# Patient Record
Sex: Female | Born: 1990 | Race: Black or African American | Hispanic: No | Marital: Single | Smoking: Current every day smoker
Health system: Southern US, Community
[De-identification: ages and names within clinical notes are randomized; demographics above are authoritative.]

## PROBLEM LIST (undated history)

## (undated) DIAGNOSIS — A549 Gonococcal infection, unspecified: Secondary | ICD-10-CM

## (undated) DIAGNOSIS — R112 Nausea with vomiting, unspecified: Secondary | ICD-10-CM

## (undated) DIAGNOSIS — Z3490 Encounter for supervision of normal pregnancy, unspecified, unspecified trimester: Secondary | ICD-10-CM

## (undated) DIAGNOSIS — A749 Chlamydial infection, unspecified: Secondary | ICD-10-CM

## (undated) DIAGNOSIS — G510 Bell's palsy: Secondary | ICD-10-CM

## (undated) DIAGNOSIS — F329 Major depressive disorder, single episode, unspecified: Secondary | ICD-10-CM

## (undated) DIAGNOSIS — Z9109 Other allergy status, other than to drugs and biological substances: Secondary | ICD-10-CM

## (undated) DIAGNOSIS — F32A Depression, unspecified: Secondary | ICD-10-CM

## (undated) DIAGNOSIS — N898 Other specified noninflammatory disorders of vagina: Secondary | ICD-10-CM

## (undated) HISTORY — PX: OTHER SURGICAL HISTORY: SHX169

## (undated) HISTORY — DX: Major depressive disorder, single episode, unspecified: F32.9

## (undated) HISTORY — DX: Other allergy status, other than to drugs and biological substances: Z91.09

## (undated) HISTORY — DX: Encounter for supervision of normal pregnancy, unspecified, unspecified trimester: Z34.90

## (undated) HISTORY — DX: Depression, unspecified: F32.A

## (undated) HISTORY — DX: Gonococcal infection, unspecified: A54.9

## (undated) HISTORY — DX: Nausea with vomiting, unspecified: R11.2

## (undated) HISTORY — DX: Chlamydial infection, unspecified: A74.9

## (undated) HISTORY — DX: Other specified noninflammatory disorders of vagina: N89.8

---
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
## 2008-02-18 ENCOUNTER — Other Ambulatory Visit: Admission: RE | Admit: 2008-02-18 | Discharge: 2008-02-18 | Payer: Self-pay | Admitting: Obstetrics and Gynecology

## 2009-03-08 ENCOUNTER — Emergency Department (HOSPITAL_COMMUNITY): Admission: EM | Admit: 2009-03-08 | Discharge: 2009-03-08 | Payer: Self-pay | Admitting: Emergency Medicine

## 2010-08-31 ENCOUNTER — Emergency Department (HOSPITAL_COMMUNITY): Admission: EM | Admit: 2010-08-31 | Discharge: 2010-08-31 | Payer: Self-pay | Admitting: Emergency Medicine

## 2011-01-18 ENCOUNTER — Other Ambulatory Visit: Payer: Self-pay | Admitting: Obstetrics & Gynecology

## 2011-01-18 ENCOUNTER — Other Ambulatory Visit (HOSPITAL_COMMUNITY)
Admission: RE | Admit: 2011-01-18 | Discharge: 2011-01-18 | Disposition: A | Discharge status: Home / Self Care | Payer: Medicaid Other | Source: Ambulatory Visit | Attending: Obstetrics & Gynecology | Admitting: Obstetrics & Gynecology

## 2011-01-18 DIAGNOSIS — Z01419 Encounter for gynecological examination (general) (routine) without abnormal findings: Secondary | ICD-10-CM | POA: Insufficient documentation

## 2011-01-18 DIAGNOSIS — Z113 Encounter for screening for infections with a predominantly sexual mode of transmission: Secondary | ICD-10-CM | POA: Insufficient documentation

## 2011-02-23 LAB — URINALYSIS, ROUTINE W REFLEX MICROSCOPIC
Bilirubin Urine: NEGATIVE
Glucose, UA: NEGATIVE mg/dL
Hgb urine dipstick: NEGATIVE
Ketones, ur: NEGATIVE mg/dL
Nitrite: NEGATIVE
Protein, ur: NEGATIVE mg/dL
Specific Gravity, Urine: 1.02 (ref 1.005–1.030)
Urobilinogen, UA: 0.2 mg/dL (ref 0.0–1.0)
pH: 6 (ref 5.0–8.0)

## 2011-02-23 LAB — URINE CULTURE: Colony Count: NO GROWTH

## 2011-02-23 LAB — URINE MICROSCOPIC-ADD ON

## 2011-02-23 LAB — PREGNANCY, URINE: Preg Test, Ur: NEGATIVE

## 2011-02-23 LAB — WET PREP, GENITAL
Trich, Wet Prep: NONE SEEN
Yeast Wet Prep HPF POC: NONE SEEN

## 2011-02-23 LAB — GC/CHLAMYDIA PROBE AMP, GENITAL
Chlamydia, DNA Probe: POSITIVE — AB
GC Probe Amp, Genital: NEGATIVE

## 2011-02-23 LAB — RPR: RPR Ser Ql: NONREACTIVE

## 2011-03-10 ENCOUNTER — Emergency Department (HOSPITAL_COMMUNITY)
Admission: EM | Admit: 2011-03-10 | Discharge: 2011-03-10 | Discharge status: Against Medical Advice | Payer: Medicaid Other | Attending: Emergency Medicine | Admitting: Emergency Medicine

## 2011-03-10 DIAGNOSIS — R51 Headache: Secondary | ICD-10-CM | POA: Insufficient documentation

## 2011-03-10 DIAGNOSIS — Z331 Pregnant state, incidental: Secondary | ICD-10-CM | POA: Insufficient documentation

## 2011-03-10 LAB — BASIC METABOLIC PANEL
BUN: 6 mg/dL (ref 6–23)
CO2: 21 mEq/L (ref 19–32)
Calcium: 8.8 mg/dL (ref 8.4–10.5)
Chloride: 106 mEq/L (ref 96–112)
Creatinine, Ser: 0.54 mg/dL (ref 0.4–1.2)
GFR calc Af Amer: >60 mL/min (ref >60)
Glucose, Bld: 83 mg/dL (ref 70–99)

## 2011-03-10 LAB — CBC
Hemoglobin: 10.6 g/dL — ABNORMAL LOW (ref 12.0–15.0)
MCH: 30.2 pg (ref 26.0–34.0)
MCHC: 34.3 g/dL (ref 30.0–36.0)
MCV: 88 fL (ref 78.0–100.0)
RBC: 3.51 MIL/uL — ABNORMAL LOW (ref 3.87–5.11)

## 2011-03-10 LAB — DIFFERENTIAL
Basophils Relative: 0 % (ref 0–1)
Lymphs Abs: 2 10*3/uL (ref 0.7–4.0)
Monocytes Absolute: 0.4 10*3/uL (ref 0.1–1.0)
Monocytes Relative: 5 % (ref 3–12)
Neutro Abs: 5.2 10*3/uL (ref 1.7–7.7)

## 2011-03-14 ENCOUNTER — Telehealth: Payer: Self-pay | Admitting: Family Medicine

## 2011-03-14 NOTE — Telephone Encounter (Signed)
This isn't our patient.  
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
## 2011-04-01 NOTE — Consult Note (Signed)
NAME:  Rose Holden, LOCICERO                ACCOUNT NO.:  [id number]   MEDICAL RECORD NO.:  [id number]          PATIENT TYPE:  EMS   LOCATION:  ED                            FACILITY:  [facility]   PHYSICIAN:  J. Darreld Mclean, M.D. DATE OF BIRTH:  [birth year]   DATE OF CONSULTATION:  11/20/2006  DATE OF DISCHARGE:  11/20/2006                                 CONSULTATION   REFERRING PHYSICIAN:  Doug Sou, M.D.   REASON FOR CONSULTATION:  The patient is a 20 year old female seen in  the ER at the request of the ER physician.   Dr. Romeo Apple was on call; they could not locate him and they tried  times, then they called me to see this patient.   She has a history of approximately 10 days ago she lacerated her hand on  some glass.   EXAMINATION:  EXTREMITIES:  She has sutures placed on the dorsum of the  right hand.  The area had gotten red and painful with some slight  purulent material coming out.  She is afebrile.  White count is 8900.  She has pain with moving her hand.  She has a laceration to the mid-  dorsum of her hand over the 3rd metacarpal in the mid-portion.  There is  some erythema and some drainage of purulent material.  She has no fever.  Range of motion of the finger, although painful, is good.  Left hand  showed a laceration to the left little finger with sutures still  present.  The wound looks good.  She is not taking any antibiotics for  this and she is not taking any other medicines.   EMERGENCY DEPARTMENT COURSE:  I had her soak her hand; I got a culture  first.  We soaked it Betadine and peroxide and saline.  The wound has  got some erythema and I got some purulent material out of her wound and  got a culture.  I put a sterile dressing on and put her in a volar  plaster splint.  She has already had a gram of Ancef.   PLAN:  I have given her a prescription for Cipro.  I want to set up some  daily Rocephin for the next several days.  I am going to see her in the  office  tomorrow afternoon.  If she gets worse, she is to let me know.  We will give her a sling tonight.  A prescription for Vicodin 5 mg was  given for pain.  If any difficulty, return to the emergency room p.r.n.           ______________________________  J. Darreld Mclean, M.D.     JWK/MEDQ  D:  11/20/2006  T:  11/21/2006  Job:  [id number]

## 2011-04-02 ENCOUNTER — Inpatient Hospital Stay (HOSPITAL_COMMUNITY)
Admission: EM | Admit: 2011-04-02 | Discharge: 2011-04-02 | Disposition: A | Discharge status: Home / Self Care | Payer: Medicaid Other | Source: Ambulatory Visit | Attending: Obstetrics & Gynecology | Admitting: Obstetrics & Gynecology

## 2011-04-02 ENCOUNTER — Emergency Department (HOSPITAL_COMMUNITY)
Admission: EM | Admit: 2011-04-02 | Discharge: 2011-04-02 | Disposition: A | Discharge status: Other Acute Inpatient Hospital | Payer: Medicaid Other | Attending: Emergency Medicine | Admitting: Emergency Medicine

## 2011-04-02 DIAGNOSIS — O4290 Premature rupture of membranes, unspecified as to length of time between rupture and onset of labor, unspecified weeks of gestation: Secondary | ICD-10-CM

## 2011-04-02 DIAGNOSIS — O99891 Other specified diseases and conditions complicating pregnancy: Secondary | ICD-10-CM | POA: Insufficient documentation

## 2011-04-02 LAB — DIFFERENTIAL
Basophils Relative: 0 % (ref 0–1)
Eosinophils Absolute: 0.1 10*3/uL (ref 0.0–0.7)
Eosinophils Relative: 2 % (ref 0–5)
Lymphs Abs: 1.4 10*3/uL (ref 0.7–4.0)
Monocytes Absolute: 0.2 10*3/uL (ref 0.1–1.0)
Monocytes Relative: 3 % (ref 3–12)
Neutrophils Relative %: 72 % (ref 43–77)

## 2011-04-02 LAB — BASIC METABOLIC PANEL
BUN: 7 mg/dL (ref 6–23)
Calcium: 9.5 mg/dL (ref 8.4–10.5)
Chloride: 105 mEq/L (ref 96–112)
Creatinine, Ser: 0.5 mg/dL (ref 0.4–1.2)
GFR calc Af Amer: >60 mL/min (ref >60)

## 2011-04-02 LAB — URINALYSIS, ROUTINE W REFLEX MICROSCOPIC
Bilirubin Urine: NEGATIVE
Glucose, UA: NEGATIVE mg/dL
Hgb urine dipstick: NEGATIVE
Ketones, ur: NEGATIVE mg/dL
Protein, ur: NEGATIVE mg/dL
Urobilinogen, UA: 0.2 mg/dL (ref 0.0–1.0)

## 2011-04-02 LAB — CBC
MCH: 30.1 pg (ref 26.0–34.0)
MCHC: 34.2 g/dL (ref 30.0–36.0)
MCV: 87.9 fL (ref 78.0–100.0)
Platelets: 246 10*3/uL (ref 150–400)
RBC: 3.56 MIL/uL — ABNORMAL LOW (ref 3.87–5.11)

## 2011-04-26 ENCOUNTER — Inpatient Hospital Stay (HOSPITAL_COMMUNITY)
Admission: AD | Admit: 2011-04-26 | Discharge: 2011-04-28 | DRG: 775 | Disposition: A | Discharge status: Home / Self Care | Payer: Medicaid Other | Source: Ambulatory Visit | Attending: Obstetrics & Gynecology | Admitting: Obstetrics & Gynecology

## 2011-04-26 LAB — COMPREHENSIVE METABOLIC PANEL
ALT: 9 U/L (ref 0–35)
AST: 16 U/L (ref 0–37)
Albumin: 2.8 g/dL — ABNORMAL LOW (ref 3.5–5.2)
Calcium: 9.4 mg/dL (ref 8.4–10.5)
Creatinine, Ser: 0.59 mg/dL (ref 0.4–1.2)
Sodium: 136 mEq/L (ref 135–145)

## 2011-04-26 LAB — URINALYSIS, ROUTINE W REFLEX MICROSCOPIC
Glucose, UA: NEGATIVE mg/dL
Specific Gravity, Urine: 1.02 (ref 1.005–1.030)
pH: 7 (ref 5.0–8.0)

## 2011-04-26 LAB — CBC
MCH: 29.9 pg (ref 26.0–34.0)
MCV: 87.9 fL (ref 78.0–100.0)
Platelets: 257 10*3/uL (ref 150–400)
RDW: 13.4 % (ref 11.5–15.5)
WBC: 9.4 10*3/uL (ref 4.0–10.5)

## 2011-04-26 LAB — URINE MICROSCOPIC-ADD ON

## 2011-07-30 ENCOUNTER — Emergency Department (HOSPITAL_COMMUNITY): Payer: Medicaid Other

## 2011-07-30 ENCOUNTER — Emergency Department (HOSPITAL_COMMUNITY)
Admission: EM | Admit: 2011-07-30 | Discharge: 2011-07-30 | Disposition: A | Discharge status: Home / Self Care | Payer: Medicaid Other | Attending: Emergency Medicine | Admitting: Emergency Medicine

## 2011-07-30 ENCOUNTER — Encounter: Payer: Self-pay | Admitting: *Deleted

## 2011-07-30 DIAGNOSIS — S41119A Laceration without foreign body of unspecified upper arm, initial encounter: Secondary | ICD-10-CM

## 2011-07-30 DIAGNOSIS — Y92009 Unspecified place in unspecified non-institutional (private) residence as the place of occurrence of the external cause: Secondary | ICD-10-CM | POA: Insufficient documentation

## 2011-07-30 DIAGNOSIS — S51009A Unspecified open wound of unspecified elbow, initial encounter: Secondary | ICD-10-CM | POA: Insufficient documentation

## 2011-07-30 MED ORDER — LIDOCAINE HCL 2 % IJ SOLN: 10.0000 mL | Freq: Once | INTRAMUSCULAR | Status: DC

## 2011-07-30 MED ORDER — HYDROCODONE-ACETAMINOPHEN 5-325 MG PO TABS: ORAL_TABLET | ORAL | Status: DC

## 2011-07-30 MED ORDER — HYDROCODONE-ACETAMINOPHEN 5-325 MG PO TABS
2.0000 | ORAL_TABLET | Freq: Once | ORAL | Status: AC
  Administered 2011-07-30: 2 via ORAL
  Filled 2011-07-30: qty 2

## 2011-07-30 MED ORDER — ONDANSETRON HCL 4 MG PO TABS
4.0000 mg | ORAL_TABLET | Freq: Once | ORAL | Status: AC
  Administered 2011-07-30: 4 mg via ORAL
  Filled 2011-07-30: qty 1

## 2011-07-30 MED ORDER — TETANUS-DIPHTH-ACELL PERTUSSIS 5-2.5-18.5 LF-MCG/0.5 IM SUSP
0.5000 mL | Freq: Once | INTRAMUSCULAR | Status: AC
  Administered 2011-07-30: 0.5 mL via INTRAMUSCULAR
  Filled 2011-07-30: qty 0.5

## 2011-07-30 MED ORDER — LIDOCAINE HCL (PF) 1 % IJ SOLN
INTRAMUSCULAR | Status: AC
  Administered 2011-07-30: 15:00:00
  Filled 2011-07-30: qty 10
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
## 2011-07-30 NOTE — ED Provider Notes (Signed)
Medical screening examination/treatment/procedure(s) were performed by non-physician practitioner and as supervising physician I was immediately available for consultation/collaboration.  Jlyn Cerros, MD 07/30/11 1951 
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
## 2011-08-12 ENCOUNTER — Emergency Department (HOSPITAL_COMMUNITY)
Admission: EM | Admit: 2011-08-12 | Discharge: 2011-08-12 | Disposition: A | Discharge status: Home / Self Care | Payer: Medicaid Other | Attending: Emergency Medicine | Admitting: Emergency Medicine

## 2011-08-12 ENCOUNTER — Encounter (HOSPITAL_COMMUNITY): Payer: Self-pay

## 2011-08-12 DIAGNOSIS — Z4802 Encounter for removal of sutures: Secondary | ICD-10-CM | POA: Insufficient documentation

## 2011-08-12 NOTE — ED Provider Notes (Signed)
Medical screening examination/treatment/procedure(s) were performed by non-physician practitioner and as supervising physician I was immediately available for consultation/collaboration.   Dayton Bailiff, MD 08/12/11 [phone]

## 2011-08-12 NOTE — ED Provider Notes (Signed)
History     CSN: [id number] Arrival date & time: 08/12/2011  6:39 PM  Chief Complaint  Patient presents with  . Suture / Staple Removal    (Consider location/radiation/quality/duration/timing/severity/associated sxs/prior treatment) Patient is a 20 y.o. female presenting with suture removal.  Suture / Staple Removal  The sutures were placed 11 to 14 days ago. There has been no treatment since the wound repair. There has been no drainage from the wound. There is no redness present. There is no swelling present. The pain has no pain. She has no difficulty moving the affected extremity or digit.    History reviewed. No pertinent past medical history.  History reviewed. No pertinent past surgical history.  No family history on file.  History  Substance Use Topics  . Smoking status: Current Everyday Smoker -- 1.0 packs/day  . Smokeless tobacco: Not on file  . Alcohol Use: Yes     occasionally    OB History    Grav Para Term Preterm Abortions TAB SAB Ect Mult Living                  Review of Systems  All other systems reviewed and are negative.    Allergies  Review of patient's allergies indicates no known allergies.  Home Medications   Current Outpatient Rx  Name Route Sig Dispense Refill  . ACETAMINOPHEN 500 MG PO TABS Oral Take 1,000 mg by mouth as needed. For pain     . HYDROCODONE-ACETAMINOPHEN 5-325 MG PO TABS  1 po q4h prn pain 10 tablet 0    BP 146/78  Pulse 64  Temp(Src) 97.9 F (36.6 C) (Oral)  Resp 16  Ht 5\' 3"  (1.6 m)  Wt 185 lb (83.915 kg)  BMI 32.77 kg/m2  SpO2 100%  LMP 08/08/2011  Physical Exam  Nursing note and vitals reviewed. Constitutional: She is oriented to person, place, and time. She appears well-developed and well-nourished.  HENT:  Head: Normocephalic and atraumatic.  Eyes: Conjunctivae are normal.  Neck: Normal range of motion.  Cardiovascular: Normal rate.   Pulmonary/Chest: Effort normal.  Abdominal: Soft. Bowel sounds  are normal. There is no tenderness.  Musculoskeletal: Normal range of motion.  Neurological: She is alert and oriented to person, place, and time.  Skin: Skin is warm and dry.       Well healed laceration right volar elbow/proximal forearm.    Psychiatric: She has a normal mood and affect.    ED Course  SUTURE REMOVAL Date/Time: 08/12/2011 7:26 PM Performed by: Torell Minder L Authorized by: Candis Musa Consent: Verbal consent obtained. Risks and benefits: risks, benefits and alternatives were discussed Consent given by: patient Body area: upper extremity Location details: right lower arm Wound Appearance: clean Staples Removed: 12 Post-removal: Steri-Strips applied Patient tolerance: Patient tolerated the procedure well with no immediate complications.   (including critical care time)  Labs Reviewed - No data to display No results found.      MDM  Staple removal.        Candis Musa, PA 08/12/11 1927

## 2011-08-12 NOTE — ED Notes (Signed)
Needs staples removed from right elbow

## 2011-08-12 NOTE — ED Notes (Signed)
Pt a/ox4. Resp even and unlabored. NAD at this time. D/C instructions reviewed with pt. Pt verbalized understanding. Pt ambulated with steady gate to POV. 

## 2011-08-24 LAB — CBC
HCT: 28.3 — ABNORMAL LOW
HCT: 35 — ABNORMAL LOW
Hemoglobin: 9.7 — ABNORMAL LOW
MCHC: 34.2
MCV: 89.5
RBC: 3.91
RDW: 14
WBC: 9.7

## 2011-08-24 LAB — URINALYSIS, ROUTINE W REFLEX MICROSCOPIC
Bilirubin Urine: NEGATIVE
Ketones, ur: NEGATIVE
Nitrite: NEGATIVE
Urobilinogen, UA: 0.2

## 2012-11-07 ENCOUNTER — Emergency Department (HOSPITAL_COMMUNITY)
Admission: EM | Admit: 2012-11-07 | Discharge: 2012-11-07 | Disposition: A | Discharge status: Home / Self Care | Payer: Medicaid Other | Attending: Emergency Medicine | Admitting: Emergency Medicine

## 2012-11-07 ENCOUNTER — Encounter (HOSPITAL_COMMUNITY): Payer: Self-pay | Admitting: Emergency Medicine

## 2012-11-07 ENCOUNTER — Emergency Department (HOSPITAL_COMMUNITY): Payer: Medicaid Other

## 2012-11-07 DIAGNOSIS — B9789 Other viral agents as the cause of diseases classified elsewhere: Secondary | ICD-10-CM | POA: Insufficient documentation

## 2012-11-07 DIAGNOSIS — R059 Cough, unspecified: Secondary | ICD-10-CM | POA: Insufficient documentation

## 2012-11-07 DIAGNOSIS — J3489 Other specified disorders of nose and nasal sinuses: Secondary | ICD-10-CM | POA: Insufficient documentation

## 2012-11-07 DIAGNOSIS — R05 Cough: Secondary | ICD-10-CM | POA: Insufficient documentation

## 2012-11-07 DIAGNOSIS — Z331 Pregnant state, incidental: Secondary | ICD-10-CM | POA: Insufficient documentation

## 2012-11-07 DIAGNOSIS — F17200 Nicotine dependence, unspecified, uncomplicated: Secondary | ICD-10-CM | POA: Insufficient documentation

## 2012-11-07 DIAGNOSIS — J029 Acute pharyngitis, unspecified: Secondary | ICD-10-CM | POA: Insufficient documentation

## 2012-11-07 DIAGNOSIS — B349 Viral infection, unspecified: Secondary | ICD-10-CM

## 2012-11-07 LAB — URINALYSIS, ROUTINE W REFLEX MICROSCOPIC
Bilirubin Urine: NEGATIVE
Hgb urine dipstick: NEGATIVE
Protein, ur: NEGATIVE mg/dL
Specific Gravity, Urine: >1.03 — ABNORMAL HIGH (ref 1.005–1.030)
Urobilinogen, UA: 1 mg/dL (ref 0.0–1.0)

## 2012-11-07 LAB — RAPID STREP SCREEN (MED CTR MEBANE ONLY): Streptococcus, Group A Screen (Direct): NEGATIVE

## 2012-11-07 MED ORDER — ACETAMINOPHEN 500 MG PO TABS
1000.0000 mg | ORAL_TABLET | Freq: Once | ORAL | Status: AC
  Administered 2012-11-07: 1000 mg via ORAL
  Filled 2012-11-07: qty 2

## 2012-11-07 MED ORDER — ONDANSETRON HCL 4 MG PO TABS: 4.0000 mg | ORAL_TABLET | Freq: Three times a day (TID) | ORAL | Status: DC | PRN

## 2012-11-07 MED ORDER — PRENATAL COMPLETE 14-0.4 MG PO TABS: 1.0000 | ORAL_TABLET | Freq: Every day | ORAL | Status: DC

## 2012-11-07 MED ORDER — OSELTAMIVIR PHOSPHATE 75 MG PO CAPS: 75.0000 mg | ORAL_CAPSULE | Freq: Two times a day (BID) | ORAL | Status: DC

## 2012-11-07 MED ORDER — IPRATROPIUM BROMIDE 0.02 % IN SOLN
0.5000 mg | Freq: Once | RESPIRATORY_TRACT | Status: AC
  Administered 2012-11-07: 0.5 mg via RESPIRATORY_TRACT
  Filled 2012-11-07: qty 2.5

## 2012-11-07 MED ORDER — IBUPROFEN 400 MG PO TABS
400.0000 mg | ORAL_TABLET | Freq: Once | ORAL | Status: AC
  Administered 2012-11-07: 400 mg via ORAL
  Filled 2012-11-07: qty 1

## 2012-11-07 MED ORDER — ALBUTEROL SULFATE HFA 108 (90 BASE) MCG/ACT IN AERS
2.0000 | INHALATION_SPRAY | RESPIRATORY_TRACT | Status: AC
  Administered 2012-11-07: 2 via RESPIRATORY_TRACT
  Filled 2012-11-07: qty 6.7

## 2012-11-07 MED ORDER — ALBUTEROL SULFATE (5 MG/ML) 0.5% IN NEBU
5.0000 mg | INHALATION_SOLUTION | Freq: Once | RESPIRATORY_TRACT | Status: AC
  Administered 2012-11-07: 5 mg via RESPIRATORY_TRACT
  Filled 2012-11-07: qty 1

## 2012-11-07 MED ORDER — ONDANSETRON 8 MG PO TBDP
8.0000 mg | ORAL_TABLET | Freq: Once | ORAL | Status: AC
  Administered 2012-11-07: 8 mg via ORAL
  Filled 2012-11-07: qty 1

## 2012-11-07 NOTE — ED Provider Notes (Signed)
History     CSN: [id number]  Arrival date & time 11/07/12  2017   First MD Initiated Contact with Patient 11/07/12 2038      Chief Complaint  Patient presents with  . Emesis  . Nasal Congestion  . Cough     HPI Pt was seen at 2045.   Per pt, c/o gradual onset and persistence of constant sore throat, runny/stuffy nose, sinus congestion, fevers/chills, generalized body aches/fatigue and non-productive cough for the past 2-3 days.  Has been associated with multiple episodes of N/V.  Did not receive her flu shot this year. Denies rash, no CP/SOB, no diarrhea, no abd pain.    History reviewed. No pertinent past medical history.  History reviewed. No pertinent past surgical history.    History  Substance Use Topics  . Smoking status: Current Every Day Smoker -- 1.0 packs/day  . Smokeless tobacco: Not on file  . Alcohol Use: Yes     Comment: occasionally    Review of Systems ROS: Statement: All systems negative except as marked or noted in the HPI; Constitutional: +fever and chills, generalized body aches/fatigue. ; ; Eyes: Negative for eye pain, redness and discharge. ; ; ENMT: Negative for ear pain, hoarseness, +rhinorrhea, nasal congestion, sinus pressure and sore throat. ; ; Cardiovascular: Negative for chest pain, palpitations, diaphoresis, dyspnea and peripheral edema. ; ; Respiratory: +cough. Negative for wheezing and stridor. ; ; Gastrointestinal: +N/V. Negative for diarrhea, abdominal pain, blood in stool, hematemesis, jaundice and rectal bleeding. . ; ; Genitourinary: Negative for dysuria, flank pain and hematuria. ; ; Musculoskeletal: Negative for back pain and neck pain. Negative for swelling and trauma.; ; Skin: Negative for pruritus, rash, abrasions, blisters, bruising and skin lesion.; ; Neuro: Negative for headache, lightheadedness and neck stiffness. Negative for weakness, altered level of consciousness , altered mental status, extremity weakness, paresthesias,  involuntary movement, seizure and syncope.      Allergies  Review of patient's allergies indicates no known allergies.  Home Medications  No current outpatient prescriptions on file.  BP 123/68  Pulse 113  Temp 102.2 F (39 C) (Oral)  Resp 20  Ht 5\' 2"  (1.575 m)  Wt 210 lb (95.255 kg)  BMI 38.41 kg/m2  SpO2 100%  LMP 09/18/2012  Physical Exam 2050: Physical examination:  Nursing notes reviewed; Vital signs and O2 SAT reviewed;  Constitutional: Well developed, Well nourished, Well hydrated, In no acute distress; Head:  Normocephalic, atraumatic; Eyes: EOMI, PERRL, No scleral icterus; ENMT: TM's clear bilat. +edemetous nasal turbinates bilat with clear rhinorrhea.  Mouth and pharynx without lesions. No tonsillar exudates. No intra-oral edema. No hoarse voice, no drooling, no stridor.  Mouth and pharynx normal, Mucous membranes moist; Neck: Supple, Full range of motion, No lymphadenopathy; Cardiovascular: Regular rate and rhythm, No murmur, rub, or gallop; Respiratory: Breath sounds coarse & equal bilaterally, scattered exp wheezes bilat.  Speaking full sentences with ease, Normal respiratory effort/excursion; Chest: Nontender, Movement normal; Abdomen: Soft, Nontender, Nondistended, Normal bowel sounds; Genitourinary: No CVA tenderness; Extremities: Pulses normal, No tenderness, No edema, No calf edema or asymmetry.; Neuro: AA&Ox3, Major CN grossly intact.  Speech clear. No gross focal motor or sensory deficits in extremities.; Skin: Color normal, Warm, Dry.   ED Course  Procedures    MDM  MDM Reviewed: nursing note and vitals Interpretation: labs and x-ray   Results for orders placed during the hospital encounter of 11/07/12  URINALYSIS, ROUTINE W REFLEX MICROSCOPIC      Component Value Range  Color, Urine YELLOW  YELLOW   APPearance CLEAR  CLEAR   Specific Gravity, Urine >1.030 (*) 1.005 - 1.030   pH 6.0  5.0 - 8.0   Glucose, UA NEGATIVE  NEGATIVE mg/dL   Hgb urine  dipstick NEGATIVE  NEGATIVE   Bilirubin Urine NEGATIVE  NEGATIVE   Ketones, ur NEGATIVE  NEGATIVE mg/dL   Protein, ur NEGATIVE  NEGATIVE mg/dL   Urobilinogen, UA 1.0  0.0 - 1.0 mg/dL   Nitrite NEGATIVE  NEGATIVE   Leukocytes, UA NEGATIVE  NEGATIVE  PREGNANCY, URINE      Component Value Range   Preg Test, Ur POSITIVE (*) NEGATIVE  RAPID STREP SCREEN      Component Value Range   Streptococcus, Group A Screen (Direct) NEGATIVE  NEGATIVE   Dg Chest 2 View 11/07/2012  *RADIOLOGY REPORT*  Clinical Data: Cough, nausea and vomiting.  CHEST - 2 VIEW  Comparison: None.  Findings: The lungs are well-aerated and clear.  There is no evidence of focal opacification, pleural effusion or pneumothorax.  The heart is normal in size; the mediastinal contour is within normal limits.  No acute osseous abnormalities are seen.  IMPRESSION: No acute cardiopulmonary process seen.   Original Report Authenticated By: Tonia Ghent, M.D.       2200:  Pt states she did not know she was pregnant.  Feels "better now" after neb.  Lungs CTA bilat, no further wheezing, Sats 100% R/A.  Pt has tol PO well while in the ED without N/V.  No stooling while in the ED.  Abd remains benign, VSS. Wants to go home now.  Pt has not had her flu shot this year.  T/C to OB/GYN Dr. Emelda Fear, case discussed, including:  HPI, pertinent PM/SHx, VS/PE, dx testing, ED course and treatment:  Agrees that this is the pt population to start tamiflu treatment (75mg  PO BID x5 days).  Dx and testing d/w pt.  Questions answered.  Verb understanding, agreeable to d/c home with outpt f/u.             Laray Anger, DO 11/09/12 1820

## 2012-11-07 NOTE — ED Notes (Signed)
Pt given ice and ginger ale says "went down fine"

## 2012-11-07 NOTE — ED Notes (Signed)
Patient complaining of "tight cough," vomiting, and abdominal pain x 2 days. States she has been unable to keep anything down for 2 days.

## 2012-11-14 NOTE — L&D Delivery Note (Signed)
Delivery Note After a 2 push 2nd stage, at 8:37 PM a viable female was delivered via  (Presentation; ROA ).A tight nuchal cord was delivered through.  APGAR: ,7/9; weight pending.    Placenta status: ,intact with 3V.  Cord:  with the following complications:none Anesthesia: Epidural  Episiotomy: none Lacerations: none Suture Repair: n/a Est. Blood Loss (mL): 50  Mom to postpartum.  Baby to nursery-stable.  [name],Abdur Hoglund 06/27/2013, 8:56 PM

## 2012-12-04 ENCOUNTER — Other Ambulatory Visit: Payer: Self-pay | Admitting: Family Medicine

## 2012-12-04 ENCOUNTER — Other Ambulatory Visit (HOSPITAL_COMMUNITY)
Admission: RE | Admit: 2012-12-04 | Discharge: 2012-12-04 | Disposition: A | Discharge status: Home / Self Care | Payer: Self-pay | Source: Ambulatory Visit | Attending: Obstetrics and Gynecology | Admitting: Obstetrics and Gynecology

## 2012-12-04 DIAGNOSIS — Z113 Encounter for screening for infections with a predominantly sexual mode of transmission: Secondary | ICD-10-CM | POA: Insufficient documentation

## 2012-12-04 DIAGNOSIS — Z01419 Encounter for gynecological examination (general) (routine) without abnormal findings: Secondary | ICD-10-CM | POA: Insufficient documentation

## 2012-12-04 LAB — OB RESULTS CONSOLE HIV ANTIBODY (ROUTINE TESTING): HIV: NONREACTIVE

## 2012-12-04 LAB — OB RESULTS CONSOLE ABO/RH

## 2012-12-04 LAB — OB RESULTS CONSOLE VARICELLA ZOSTER ANTIBODY, IGG: Varicella: NON-IMMUNE/NOT IMMUNE

## 2012-12-04 LAB — OB RESULTS CONSOLE PLATELET COUNT: Platelets: 390 10*3/uL

## 2012-12-04 LAB — OB RESULTS CONSOLE RUBELLA ANTIBODY, IGM: Rubella: IMMUNE

## 2013-02-12 ENCOUNTER — Encounter: Payer: Self-pay | Admitting: Advanced Practice Midwife

## 2013-02-13 ENCOUNTER — Encounter: Payer: Self-pay | Admitting: *Deleted

## 2013-02-13 ENCOUNTER — Encounter: Payer: Self-pay | Admitting: Obstetrics and Gynecology

## 2013-02-28 ENCOUNTER — Ambulatory Visit (INDEPENDENT_AMBULATORY_CARE_PROVIDER_SITE_OTHER): Payer: Medicaid Other | Admitting: Obstetrics and Gynecology

## 2013-02-28 VITALS — BP 118/60 | Wt 204.8 lb

## 2013-02-28 DIAGNOSIS — O99320 Drug use complicating pregnancy, unspecified trimester: Secondary | ICD-10-CM

## 2013-02-28 DIAGNOSIS — Z331 Pregnant state, incidental: Secondary | ICD-10-CM

## 2013-02-28 DIAGNOSIS — O0930 Supervision of pregnancy with insufficient antenatal care, unspecified trimester: Secondary | ICD-10-CM | POA: Insufficient documentation

## 2013-02-28 DIAGNOSIS — O0990 Supervision of high risk pregnancy, unspecified, unspecified trimester: Secondary | ICD-10-CM

## 2013-02-28 DIAGNOSIS — O0931 Supervision of pregnancy with insufficient antenatal care, first trimester: Secondary | ICD-10-CM

## 2013-02-28 DIAGNOSIS — O2390 Unspecified genitourinary tract infection in pregnancy, unspecified trimester: Secondary | ICD-10-CM

## 2013-02-28 DIAGNOSIS — Z1389 Encounter for screening for other disorder: Secondary | ICD-10-CM

## 2013-02-28 LAB — POCT URINALYSIS DIPSTICK
Glucose, UA: NEGATIVE
Nitrite, UA: NEGATIVE

## 2013-02-28 NOTE — Progress Notes (Signed)
Small amount of vaginal bleeding "after lifting basket of clothes"  Also has missed appts x2, assertively dissatisfied at not getting u/s today.  Brief (no chg) u/s done to confirm sex of baby. Fundal placenta. No recent bleeding. Pelvic normal with long closed cx. EFG normal , no site of source of blood noted. Plan: fetal survey next wk.

## 2013-02-28 NOTE — Patient Instructions (Signed)
Please keep appt next week for anatomy scan of baby Childbirth classes are very helpful to you , and especially to partner, please consider .

## 2013-02-28 NOTE — Addendum Note (Signed)
Addended by: Tilda Burrow on: 02/28/2013 04:50 PM   Modules accepted: Orders

## 2013-03-03 ENCOUNTER — Encounter: Payer: Self-pay | Admitting: Obstetrics and Gynecology

## 2013-03-03 DIAGNOSIS — A5901 Trichomonal vulvovaginitis: Secondary | ICD-10-CM | POA: Insufficient documentation

## 2013-03-03 DIAGNOSIS — O23599 Infection of other part of genital tract in pregnancy, unspecified trimester: Secondary | ICD-10-CM

## 2013-03-03 DIAGNOSIS — O99321 Drug use complicating pregnancy, first trimester: Secondary | ICD-10-CM | POA: Insufficient documentation

## 2013-03-04 ENCOUNTER — Other Ambulatory Visit: Payer: Medicaid Other

## 2013-03-06 ENCOUNTER — Ambulatory Visit (INDEPENDENT_AMBULATORY_CARE_PROVIDER_SITE_OTHER): Payer: Medicaid Other

## 2013-03-06 ENCOUNTER — Other Ambulatory Visit: Payer: Self-pay | Admitting: Obstetrics and Gynecology

## 2013-03-06 DIAGNOSIS — F1920 Other psychoactive substance dependence, uncomplicated: Secondary | ICD-10-CM

## 2013-03-06 DIAGNOSIS — O99320 Drug use complicating pregnancy, unspecified trimester: Secondary | ICD-10-CM

## 2013-03-06 DIAGNOSIS — O99321 Drug use complicating pregnancy, first trimester: Secondary | ICD-10-CM

## 2013-03-06 DIAGNOSIS — O0931 Supervision of pregnancy with insufficient antenatal care, first trimester: Secondary | ICD-10-CM

## 2013-03-06 DIAGNOSIS — O0990 Supervision of high risk pregnancy, unspecified, unspecified trimester: Secondary | ICD-10-CM

## 2013-03-06 DIAGNOSIS — A5901 Trichomonal vulvovaginitis: Secondary | ICD-10-CM

## 2013-03-06 DIAGNOSIS — O0930 Supervision of pregnancy with insufficient antenatal care, unspecified trimester: Secondary | ICD-10-CM

## 2013-03-06 NOTE — Progress Notes (Signed)
U/S (23+1wks)-active fetus, meas c/w dates, fluid wnl, post gr 0 plac, no obvious abnl noted, cx long and closed, bilateral adnexa wnl, female fetus

## 2013-03-10 LAB — US OB DETAIL + 14 WK

## 2013-03-27 ENCOUNTER — Encounter: Payer: Self-pay | Admitting: Obstetrics and Gynecology

## 2013-03-27 ENCOUNTER — Ambulatory Visit (INDEPENDENT_AMBULATORY_CARE_PROVIDER_SITE_OTHER): Payer: Medicaid Other | Admitting: Obstetrics and Gynecology

## 2013-03-27 VITALS — BP 120/78 | Wt 203.8 lb

## 2013-03-27 DIAGNOSIS — O2390 Unspecified genitourinary tract infection in pregnancy, unspecified trimester: Secondary | ICD-10-CM

## 2013-03-27 DIAGNOSIS — Z331 Pregnant state, incidental: Secondary | ICD-10-CM

## 2013-03-27 DIAGNOSIS — O99320 Drug use complicating pregnancy, unspecified trimester: Secondary | ICD-10-CM

## 2013-03-27 DIAGNOSIS — A5901 Trichomonal vulvovaginitis: Secondary | ICD-10-CM

## 2013-03-27 DIAGNOSIS — Z1389 Encounter for screening for other disorder: Secondary | ICD-10-CM

## 2013-03-27 DIAGNOSIS — O99330 Smoking (tobacco) complicating pregnancy, unspecified trimester: Secondary | ICD-10-CM

## 2013-03-27 DIAGNOSIS — O0990 Supervision of high risk pregnancy, unspecified, unspecified trimester: Secondary | ICD-10-CM

## 2013-03-27 LAB — POCT URINALYSIS DIPSTICK
Glucose, UA: NEGATIVE
Ketones, UA: NEGATIVE
Leukocytes, UA: NEGATIVE
Nitrite, UA: NEGATIVE

## 2013-03-27 MED ORDER — METRONIDAZOLE 500 MG PO TABS: 500.0000 mg | ORAL_TABLET | Freq: Two times a day (BID) | ORAL | Status: DC

## 2013-03-27 NOTE — Progress Notes (Signed)
Pt here today for routine visit. Pt denies any problems at this time.

## 2013-03-27 NOTE — Patient Instructions (Signed)
Get partner treated Have him read the info on trichomonasTrichomoniasis Trichomoniasis is an infection, caused by the Trichomonas organism, that affects both women and men. In women, the outer female genitalia and the vagina are affected. In men, the penis is mainly affected, but the prostate and other reproductive organs can also be involved. Trichomoniasis is a sexually transmitted disease (STD) and is most often passed to another person through sexual contact. The majority of people who get trichomoniasis do so from a sexual encounter and are also at risk for other STDs. CAUSES   Sexual intercourse with an infected partner.  It can be present in swimming pools or hot tubs. SYMPTOMS   Abnormal gray-green frothy vaginal discharge in women.  Vaginal itching and irritation in women.  Itching and irritation of the area outside the vagina in women.  Penile discharge with or without pain in males.  Inflammation of the urethra (urethritis), causing painful urination.  Bleeding after sexual intercourse. RELATED COMPLICATIONS  Pelvic inflammatory disease.  Infection of the uterus (endometritis).  Infertility.  Tubal (ectopic) pregnancy.  It can be associated with other STDs, including gonorrhea and chlamydia, hepatitis B, and HIV. COMPLICATIONS DURING PREGNANCY  Early (premature) delivery.  Premature rupture of the membranes (PROM).  Low birth weight. DIAGNOSIS   Visualization of Trichomonas under the microscope from the vagina discharge.  Ph of the vagina greater than 4.5, tested with a test tape.  Trich Rapid Test.  Culture of the organism, but this is not usually needed.  It may be found on a Pap test.  Having a "strawberry cervix,"which means the cervix looks very red like a strawberry. TREATMENT   You may be given medication to fight the infection. Inform your caregiver if you could be or are pregnant. Some medications used to treat the infection should not be  taken during pregnancy.  Over-the-counter medications or creams to decrease itching or irritation may be recommended.  Your sexual partner will need to be treated if infected. HOME CARE INSTRUCTIONS   Take all medication prescribed by your caregiver.  Take over-the-counter medication for itching or irritation as directed by your caregiver.  Do not have sexual intercourse while you have the infection.  Do not douche or wear tampons.  Discuss your infection with your partner, as your partner may have acquired the infection from you. Or, your partner may have been the person who transmitted the infection to you.  Have your sex partner examined and treated if necessary.  Practice safe, informed, and protected sex.  See your caregiver for other STD testing. SEEK MEDICAL CARE IF:   You still have symptoms after you finish the medication.  You have an oral temperature above 102 F (38.9 C).  You develop belly (abdominal) pain.  You have pain when you urinate.  You have bleeding after sexual intercourse.  You develop a rash.  The medication makes you sick or makes you throw up (vomit). Document Released: 04/26/2001 Document Revised: 01/23/2012 Document Reviewed: 05/22/2009 [facility] Patient Information 2013 [place], [place].

## 2013-04-09 ENCOUNTER — Other Ambulatory Visit: Payer: Medicaid Other

## 2013-04-11 ENCOUNTER — Other Ambulatory Visit: Payer: Medicaid Other

## 2013-04-18 ENCOUNTER — Other Ambulatory Visit: Payer: Medicaid Other

## 2013-04-18 DIAGNOSIS — Z3482 Encounter for supervision of other normal pregnancy, second trimester: Secondary | ICD-10-CM

## 2013-04-18 LAB — CBC
HCT: 28.8 % — ABNORMAL LOW (ref 36.0–46.0)
Hemoglobin: 9.9 g/dL — ABNORMAL LOW (ref 12.0–15.0)
MCHC: 34.4 g/dL (ref 30.0–36.0)
WBC: 7 10*3/uL (ref 4.0–10.5)

## 2013-04-19 LAB — GLUCOSE TOLERANCE, 2 HOURS W/ 1HR
Glucose, 2 hour: 87 mg/dL (ref 70–139)
Glucose, Fasting: 74 mg/dL (ref 70–99)

## 2013-04-19 LAB — HIV ANTIBODY (ROUTINE TESTING W REFLEX): HIV: NONREACTIVE

## 2013-04-19 LAB — HSV 2 ANTIBODY, IGG: HSV 2 Glycoprotein G Ab, IgG: 0.14 IV

## 2013-04-19 LAB — RPR

## 2013-04-24 ENCOUNTER — Ambulatory Visit (INDEPENDENT_AMBULATORY_CARE_PROVIDER_SITE_OTHER): Payer: Medicaid Other | Admitting: Obstetrics and Gynecology

## 2013-04-24 VITALS — BP 118/80 | Wt 209.8 lb

## 2013-04-24 DIAGNOSIS — O99330 Smoking (tobacco) complicating pregnancy, unspecified trimester: Secondary | ICD-10-CM

## 2013-04-24 DIAGNOSIS — Z331 Pregnant state, incidental: Secondary | ICD-10-CM

## 2013-04-24 DIAGNOSIS — Z1389 Encounter for screening for other disorder: Secondary | ICD-10-CM

## 2013-04-24 DIAGNOSIS — Z3493 Encounter for supervision of normal pregnancy, unspecified, third trimester: Secondary | ICD-10-CM

## 2013-04-24 DIAGNOSIS — O09899 Supervision of other high risk pregnancies, unspecified trimester: Secondary | ICD-10-CM

## 2013-04-24 LAB — POCT URINALYSIS DIPSTICK
Blood, UA: NEGATIVE
Glucose, UA: NEGATIVE
Ketones, UA: NEGATIVE

## 2013-04-24 NOTE — Progress Notes (Signed)
Good fm, routine pnc. Still with house arrest x 1 more month. jvf

## 2013-04-24 NOTE — Progress Notes (Signed)
Pt here today for routine visit. Pt states she has some pain in the lower part of her stomach at times. Pt denies any other issues at this time.

## 2013-05-15 ENCOUNTER — Encounter: Payer: Medicaid Other | Admitting: Obstetrics & Gynecology

## 2013-05-28 ENCOUNTER — Encounter: Payer: Medicaid Other | Admitting: Obstetrics and Gynecology

## 2013-06-06 ENCOUNTER — Ambulatory Visit (INDEPENDENT_AMBULATORY_CARE_PROVIDER_SITE_OTHER): Payer: Self-pay | Admitting: Obstetrics & Gynecology

## 2013-06-06 ENCOUNTER — Encounter: Payer: Self-pay | Admitting: Obstetrics & Gynecology

## 2013-06-06 VITALS — BP 120/80 | Wt 215.0 lb

## 2013-06-06 DIAGNOSIS — Z331 Pregnant state, incidental: Secondary | ICD-10-CM

## 2013-06-06 DIAGNOSIS — O09899 Supervision of other high risk pregnancies, unspecified trimester: Secondary | ICD-10-CM

## 2013-06-06 DIAGNOSIS — Z3483 Encounter for supervision of other normal pregnancy, third trimester: Secondary | ICD-10-CM

## 2013-06-06 DIAGNOSIS — Z1389 Encounter for screening for other disorder: Secondary | ICD-10-CM

## 2013-06-06 LAB — POCT URINALYSIS DIPSTICK
Glucose, UA: NEGATIVE
Ketones, UA: NEGATIVE

## 2013-06-06 NOTE — Progress Notes (Signed)
PAIN IN BACK AND LOWER PART OF STOMACH, SOME RECTAL PRESSURE.

## 2013-06-06 NOTE — Addendum Note (Signed)
Addended by: Colen Darling on: 06/06/2013 03:31 PM   Modules accepted: Orders

## 2013-06-06 NOTE — Patient Instructions (Signed)
Epidural Risks and Benefits The continuous putting in (infusion) of local anesthetics through a long, narrow, hollow plastic tube (catheter)/needle into the lower (lumbar) area of your spine is commonly called an epidural. This means outside the covering of the spinal cord. The epidural catheter is placed in the space on the outside of the membrane that covers the spinal cord. The anesthetic medicine numbs the nerves of the spinal cord in the epidural space. There is also a spinal/epidural anesthetic using two needles and a catheter. The medication is first placed in the spinal canal. Then that needle is removed and a catheter is placed in the epidural space through the second needle for continuous anesthesia. This seems to be the most popular type of regional anesthesia used now. This is sometimes given for pain management to women who are giving birth. Spinal and epidural anesthesia are called regional anesthesia because they numb a certain region of the body. While it is an effective pain management tool, some reasons not to use this include:  Restricted mobility: The tubes and monitors connected to you do not allow for much moving around.  Increased likelihood of bladder catheterization, oxytocin administration, and internal monitoring. This means a tube (catheter) may have to be put into the bladder to drain the urine. Uterine contractions can become weaker and less frequent. They also may have a higher use of oxytocin than mothers not having regional anesthesia.  Increased likelihood of operative delivery: This includes the use of or need for forceps, vacuum extractor, episiotomy, or cesarean delivery. When the dose is too large, or when it sinks down into the "tailbone" (sacral) region of the body, the perineum and the birth canal (vagina) are anesthetized. Anesthetic is injected into this area late in labor to deaden all sensation. When it "accidentally" happens earlier in labor, the muscles of the  pelvic floor are relaxed too early. This interferes with the normal flexion and rotation of the baby's head as it passes through the birth canal. This interference can lead to abnormal presentations that are more dangerous for the baby.  Must use an automatic blood pressure cuff throughout labor. This is a cuff that automatically takes your blood pressure at regular intervals. SHORT TERM MATERNAL RISKS  Dural puncture - The dura is one of the membranes surrounding the spinal cord. If the anesthetic medication gets into the spinal canal through a dural puncture, it can result in a spinal anesthetic and spinal headache. Spinal headaches are treated with an epidural blood patch to cover the punctured area.  Low blood pressure (hypotension) - Nearly one third of women with an epidural will develop low blood pressure. The ways that patients must lay during the epidural can make this worse. Their position is limited because they will be unable to move their legs easily for the time of the anesthetic. Low blood pressure is also a risk for the baby. If the baby does not get enough oxygen from the mom's blood, it can result in an emergency Cesarean section. This means the baby is delivered by an operation through a cut by the surgeon (incision) on the belly of the mother.  Nausea, vomiting, and prolonged shivering.  Prolonged labor - With large doses of anesthetic medication, the patient loses the desire and the ability to bear down and push. This results in an increased use of forceps and vacuum extractions, compared to women having unmedicated deliveries.  Uneven, incomplete or non-existent pain relief. Sometimes the epidural does not work well and   additional medications may be needed for pain relief.  Difficulty breathing well or paralysis if the level of anesthesia goes too high in the spine.  Convulsions - If the anesthetic agent accidentally is injected into a blood vessel it can cause convulsions and  loss of consciousness.  Toxic drug reactions.  Septic meningitis - An abscess can form at the site where the epidural catheter is placed. If this spreads into the spinal canal it can cause meningitis.  Allergic reaction - This causes blood pressure to become too low and other medications and fluids must be given to bring the blood pressure up. Also rashes and difficulty breathing may develop.  Cardiac arrest - This is rare but real threat to the life of the mother and baby.  Fever is common.  Itching that is easily treated.  Spinal hematoma. LONG TERM MATERNAL RISKS  Neurological complications - A nerve problem called Horner's syndrome can develop with epidural anesthesia for vaginal delivery. It is impossible to predict which patients will develop a Horner's syndrome. Even the nerves to the face can be blocked, temporarily or permanently. Tremors and shakes can occur.  Paresthesia ("pins and needles"). This is a feeling that comes from inflammation of a nerve.  Dizziness and fainting can become a problem after epidurals. This is usually only for a couple of days. RISKS TO BABY  Direct drug toxicity.  Fetal distress, abnormal fetal heart rate (FHR) (can lead to emergency cesarean). This is especially true if the anesthetic gets into the mother's blood stream or too much medication is put into the epidural. REASONS NOT TO HAVE EPIDURAL ANESTHESIA  Increased costs.  The mother has a low blood pressure.  There are blood clotting problems.  A brain tumor is present.  There is an infection in the blood stream.  A skin infection at the needle site.  A tattoo at the needle site. BENEFITS  Regional anesthesia is the most effective pain relief for labor and delivery.  It is the best anesthetic for preeclampsia and eclampsia.  There is better pain control after delivery (vaginal or cesarean).  When done correctly, no medication gets to the baby.  Sooner ambulation after  delivery.  It can be left in place during all of labor.  You can be awake during a Cesarean delivery and see the baby immediately after delivery. AFTER THE PROCEDURE   You will be kept in bed for several hours to prevent headaches.  You will be kept in bed until your legs are no longer numb and it is safe to walk.  The length of time you spend in the hospital will depend on the type of surgery or procedure you have had.  The epidural catheter is removed after you no longer need it for pain. HOME CARE INSTRUCTIONS   Do not drive or operate any kind of machinery for at least 24 hours. Make sure there is someone to drive you home.  Do not drink alcohol for at least 24 hours after the anesthesia.  Do not make important decisions for at least 24 hours after the anesthesia.  Drink lots of fluids.  Return to your normal diet.  Keep all your postoperative appointments as scheduled. SEEK IMMEDIATE MEDICAL CARE IF:  You develop a fever or temperature over 98.6 F (37 C).  You have a persistent headache.  You develop dizziness, fainting or lightheadedness.  You develop weakness, numbness or tingling in your arms or legs.  You have a skin rash.  You   have difficulty breathing  You have a stiff neck with or without stiff back.  You develop chest pain. Document Released: 10/31/2005 Document Revised: 01/23/2012 Document Reviewed: 12/08/2008 [employer] Patient Information 2014 [employer].  

## 2013-06-06 NOTE — Progress Notes (Signed)
BP weight and urine results all reviewed and noted. Patient reports good fetal movement, denies any bleeding and no rupture of membranes symptoms or regular contractions. Patient is without complaints. All questions were answered.  

## 2013-06-13 ENCOUNTER — Ambulatory Visit (INDEPENDENT_AMBULATORY_CARE_PROVIDER_SITE_OTHER): Payer: Self-pay | Admitting: Women's Health

## 2013-06-13 VITALS — BP 118/58 | Wt 219.2 lb

## 2013-06-13 DIAGNOSIS — Z3483 Encounter for supervision of other normal pregnancy, third trimester: Secondary | ICD-10-CM

## 2013-06-13 DIAGNOSIS — F1290 Cannabis use, unspecified, uncomplicated: Secondary | ICD-10-CM

## 2013-06-13 DIAGNOSIS — Z1389 Encounter for screening for other disorder: Secondary | ICD-10-CM

## 2013-06-13 DIAGNOSIS — O99019 Anemia complicating pregnancy, unspecified trimester: Secondary | ICD-10-CM

## 2013-06-13 DIAGNOSIS — Z3480 Encounter for supervision of other normal pregnancy, unspecified trimester: Secondary | ICD-10-CM

## 2013-06-13 DIAGNOSIS — Z331 Pregnant state, incidental: Secondary | ICD-10-CM

## 2013-06-13 LAB — POCT URINALYSIS DIPSTICK
Ketones, UA: NEGATIVE
Leukocytes, UA: NEGATIVE

## 2013-06-13 NOTE — Progress Notes (Signed)
C/o lower abdominal pain and sharp shooting pain in buttocks. GBS, GC/CHL today.

## 2013-06-13 NOTE — Patient Instructions (Signed)

## 2013-06-13 NOTE — Progress Notes (Signed)
Reports good fm. Denies uc's, lof, vb, urinary frequency, urgency, hesitancy, or dysuria.  No complaints.  Reviewed labor s/s, fetal kick counts.  All questions answered. GBS, Gc/ch today. F/U in 1wk for visit.

## 2013-06-14 LAB — DRUG SCREEN, URINE, NO CONFIRMATION
Barbiturate Quant, Ur: NEGATIVE
Benzodiazepines.: NEGATIVE
Marijuana Metabolite: POSITIVE — AB
Methadone: NEGATIVE
Propoxyphene: NEGATIVE

## 2013-06-14 LAB — GC/CHLAMYDIA PROBE AMP: GC Probe RNA: NEGATIVE

## 2013-06-16 LAB — STREP B DNA PROBE: GBSP: POSITIVE

## 2013-06-19 ENCOUNTER — Encounter: Payer: Self-pay | Admitting: Obstetrics & Gynecology

## 2013-06-24 ENCOUNTER — Encounter: Payer: Self-pay | Admitting: Obstetrics and Gynecology

## 2013-06-27 ENCOUNTER — Encounter (HOSPITAL_COMMUNITY): Payer: Self-pay | Admitting: *Deleted

## 2013-06-27 ENCOUNTER — Inpatient Hospital Stay (HOSPITAL_COMMUNITY)
Admission: AD | Admit: 2013-06-27 | Discharge: 2013-06-29 | DRG: 775 | Disposition: A | Discharge status: Home / Self Care | Payer: Medicaid Other | Source: Ambulatory Visit | Attending: Family Medicine | Admitting: Family Medicine

## 2013-06-27 ENCOUNTER — Encounter: Payer: Self-pay | Admitting: Advanced Practice Midwife

## 2013-06-27 ENCOUNTER — Encounter (HOSPITAL_COMMUNITY): Payer: Self-pay | Admitting: Anesthesiology

## 2013-06-27 ENCOUNTER — Inpatient Hospital Stay (HOSPITAL_COMMUNITY): Payer: Medicaid Other | Admitting: Anesthesiology

## 2013-06-27 DIAGNOSIS — A5901 Trichomonal vulvovaginitis: Secondary | ICD-10-CM

## 2013-06-27 DIAGNOSIS — O99334 Smoking (tobacco) complicating childbirth: Secondary | ICD-10-CM

## 2013-06-27 DIAGNOSIS — O99892 Other specified diseases and conditions complicating childbirth: Principal | ICD-10-CM | POA: Diagnosis present

## 2013-06-27 DIAGNOSIS — Z3483 Encounter for supervision of other normal pregnancy, third trimester: Secondary | ICD-10-CM

## 2013-06-27 DIAGNOSIS — O99321 Drug use complicating pregnancy, first trimester: Secondary | ICD-10-CM

## 2013-06-27 DIAGNOSIS — Z22330 Carrier of Group B streptococcus: Secondary | ICD-10-CM

## 2013-06-27 DIAGNOSIS — O9989 Other specified diseases and conditions complicating pregnancy, childbirth and the puerperium: Secondary | ICD-10-CM

## 2013-06-27 LAB — CBC
Hemoglobin: 11.6 g/dL — ABNORMAL LOW (ref 12.0–15.0)
MCH: 29.1 pg (ref 26.0–34.0)
RBC: 3.99 MIL/uL (ref 3.87–5.11)

## 2013-06-27 LAB — RPR: RPR Ser Ql: NONREACTIVE

## 2013-06-27 MED ORDER — TETANUS-DIPHTH-ACELL PERTUSSIS 5-2.5-18.5 LF-MCG/0.5 IM SUSP: 0.5000 mL | Freq: Once | INTRAMUSCULAR | Status: DC

## 2013-06-27 MED ORDER — DIPHENHYDRAMINE HCL 50 MG/ML IJ SOLN: 12.5000 mg | INTRAMUSCULAR | Status: DC | PRN

## 2013-06-27 MED ORDER — IBUPROFEN 600 MG PO TABS
600.0000 mg | ORAL_TABLET | Freq: Four times a day (QID) | ORAL | Status: DC
  Administered 2013-06-28 – 2013-06-29 (×5): 600 mg via ORAL
  Filled 2013-06-27 (×3): qty 1

## 2013-06-27 MED ORDER — PRENATAL MULTIVITAMIN CH
1.0000 | ORAL_TABLET | Freq: Every day | ORAL | Status: DC
  Administered 2013-06-28: 1 via ORAL
  Filled 2013-06-27 (×2): qty 1

## 2013-06-27 MED ORDER — ONDANSETRON HCL 4 MG/2ML IJ SOLN: 4.0000 mg | INTRAMUSCULAR | Status: DC | PRN

## 2013-06-27 MED ORDER — DIPHENHYDRAMINE HCL 25 MG PO CAPS: 25.0000 mg | ORAL_CAPSULE | Freq: Four times a day (QID) | ORAL | Status: DC | PRN

## 2013-06-27 MED ORDER — ONDANSETRON HCL 4 MG/2ML IJ SOLN: 4.0000 mg | Freq: Four times a day (QID) | INTRAMUSCULAR | Status: DC | PRN

## 2013-06-27 MED ORDER — LIDOCAINE HCL (PF) 1 % IJ SOLN
INTRAMUSCULAR | Status: DC | PRN
  Administered 2013-06-27 (×4): 4 mL

## 2013-06-27 MED ORDER — OXYTOCIN 40 UNITS IN LACTATED RINGERS INFUSION - SIMPLE MED
1.0000 m[IU]/min | INTRAVENOUS | Status: DC
  Administered 2013-06-27: 5 m[IU]/min via INTRAVENOUS
  Administered 2013-06-27: 4 m[IU]/min via INTRAVENOUS
  Administered 2013-06-27: 2 m[IU]/min via INTRAVENOUS
  Administered 2013-06-27: 3 m[IU]/min via INTRAVENOUS
  Filled 2013-06-27: qty 1000

## 2013-06-27 MED ORDER — ZOLPIDEM TARTRATE 5 MG PO TABS: 5.0000 mg | ORAL_TABLET | Freq: Every evening | ORAL | Status: DC | PRN

## 2013-06-27 MED ORDER — METHYLERGONOVINE MALEATE 0.2 MG PO TABS: 0.2000 mg | ORAL_TABLET | ORAL | Status: DC | PRN

## 2013-06-27 MED ORDER — PHENYLEPHRINE 40 MCG/ML (10ML) SYRINGE FOR IV PUSH (FOR BLOOD PRESSURE SUPPORT)
80.0000 ug | PREFILLED_SYRINGE | INTRAVENOUS | Status: DC | PRN
  Filled 2013-06-27: qty 2

## 2013-06-27 MED ORDER — MEASLES, MUMPS & RUBELLA VAC ~~LOC~~ INJ: 0.5000 mL | INJECTION | Freq: Once | SUBCUTANEOUS | Status: DC

## 2013-06-27 MED ORDER — OXYTOCIN 40 UNITS IN LACTATED RINGERS INFUSION - SIMPLE MED: 1.0000 m[IU]/min | INTRAVENOUS | Status: DC

## 2013-06-27 MED ORDER — CITRIC ACID-SODIUM CITRATE 334-500 MG/5ML PO SOLN: 30.0000 mL | ORAL | Status: DC | PRN

## 2013-06-27 MED ORDER — OXYCODONE-ACETAMINOPHEN 5-325 MG PO TABS
1.0000 | ORAL_TABLET | ORAL | Status: DC | PRN
  Administered 2013-06-28 – 2013-06-29 (×3): 1 via ORAL
  Filled 2013-06-27: qty 1

## 2013-06-27 MED ORDER — WITCH HAZEL-GLYCERIN EX PADS: 1.0000 "application " | MEDICATED_PAD | CUTANEOUS | Status: DC | PRN

## 2013-06-27 MED ORDER — SENNOSIDES-DOCUSATE SODIUM 8.6-50 MG PO TABS
2.0000 | ORAL_TABLET | Freq: Every day | ORAL | Status: DC
  Administered 2013-06-28: 2 via ORAL

## 2013-06-27 MED ORDER — METHYLERGONOVINE MALEATE 0.2 MG/ML IJ SOLN: 0.2000 mg | INTRAMUSCULAR | Status: DC | PRN

## 2013-06-27 MED ORDER — FERROUS SULFATE 325 (65 FE) MG PO TABS
325.0000 mg | ORAL_TABLET | Freq: Two times a day (BID) | ORAL | Status: DC
  Administered 2013-06-28 (×2): 325 mg via ORAL
  Filled 2013-06-27 (×3): qty 1

## 2013-06-27 MED ORDER — FENTANYL 2.5 MCG/ML BUPIVACAINE 1/10 % EPIDURAL INFUSION (WH - ANES)
14.0000 mL/h | INTRAMUSCULAR | Status: DC | PRN
  Administered 2013-06-27: 14 mL/h via EPIDURAL
  Filled 2013-06-27 (×2): qty 125

## 2013-06-27 MED ORDER — SIMETHICONE 80 MG PO CHEW: 80.0000 mg | CHEWABLE_TABLET | ORAL | Status: DC | PRN

## 2013-06-27 MED ORDER — LACTATED RINGERS IV SOLN
INTRAVENOUS | Status: DC
  Administered 2013-06-27 (×2): via INTRAVENOUS

## 2013-06-27 MED ORDER — PHENYLEPHRINE 40 MCG/ML (10ML) SYRINGE FOR IV PUSH (FOR BLOOD PRESSURE SUPPORT)
80.0000 ug | PREFILLED_SYRINGE | INTRAVENOUS | Status: DC | PRN
  Filled 2013-06-27: qty 5
  Filled 2013-06-27: qty 2

## 2013-06-27 MED ORDER — OXYTOCIN 40 UNITS IN LACTATED RINGERS INFUSION - SIMPLE MED: 62.5000 mL/h | INTRAVENOUS | Status: DC | PRN

## 2013-06-27 MED ORDER — OXYTOCIN 40 UNITS IN LACTATED RINGERS INFUSION - SIMPLE MED: 62.5000 mL/h | INTRAVENOUS | Status: DC

## 2013-06-27 MED ORDER — ACETAMINOPHEN 325 MG PO TABS: 650.0000 mg | ORAL_TABLET | ORAL | Status: DC | PRN

## 2013-06-27 MED ORDER — FLEET ENEMA 7-19 GM/118ML RE ENEM: 1.0000 | ENEMA | Freq: Every day | RECTAL | Status: DC | PRN

## 2013-06-27 MED ORDER — OXYTOCIN BOLUS FROM INFUSION: 500.0000 mL | INTRAVENOUS | Status: DC

## 2013-06-27 MED ORDER — PENICILLIN G POTASSIUM 5000000 UNITS IJ SOLR
5.0000 10*6.[IU] | Freq: Once | INTRAVENOUS | Status: AC
  Administered 2013-06-27: 5 10*6.[IU] via INTRAVENOUS
  Filled 2013-06-27: qty 5

## 2013-06-27 MED ORDER — BISACODYL 10 MG RE SUPP: 10.0000 mg | Freq: Every day | RECTAL | Status: DC | PRN

## 2013-06-27 MED ORDER — EPHEDRINE 5 MG/ML INJ
10.0000 mg | INTRAVENOUS | Status: DC | PRN
  Filled 2013-06-27: qty 2

## 2013-06-27 MED ORDER — LIDOCAINE HCL (PF) 1 % IJ SOLN
30.0000 mL | INTRAMUSCULAR | Status: DC | PRN
  Filled 2013-06-27 (×2): qty 30

## 2013-06-27 MED ORDER — LACTATED RINGERS IV SOLN
500.0000 mL | INTRAVENOUS | Status: DC | PRN
  Administered 2013-06-27: 500 mL via INTRAVENOUS

## 2013-06-27 MED ORDER — DIBUCAINE 1 % RE OINT: 1.0000 "application " | TOPICAL_OINTMENT | RECTAL | Status: DC | PRN

## 2013-06-27 MED ORDER — LANOLIN HYDROUS EX OINT: TOPICAL_OINTMENT | CUTANEOUS | Status: DC | PRN

## 2013-06-27 MED ORDER — ONDANSETRON HCL 4 MG PO TABS: 4.0000 mg | ORAL_TABLET | ORAL | Status: DC | PRN

## 2013-06-27 MED ORDER — OXYCODONE-ACETAMINOPHEN 5-325 MG PO TABS
1.0000 | ORAL_TABLET | ORAL | Status: DC | PRN
  Filled 2013-06-27: qty 1
  Filled 2013-06-27: qty 2

## 2013-06-27 MED ORDER — LACTATED RINGERS IV SOLN: 500.0000 mL | Freq: Once | INTRAVENOUS | Status: DC

## 2013-06-27 MED ORDER — EPHEDRINE 5 MG/ML INJ
10.0000 mg | INTRAVENOUS | Status: DC | PRN
  Filled 2013-06-27: qty 2
  Filled 2013-06-27: qty 4

## 2013-06-27 MED ORDER — BUTORPHANOL TARTRATE 1 MG/ML IJ SOLN
1.0000 mg | Freq: Once | INTRAMUSCULAR | Status: AC
  Administered 2013-06-27: 1 mg via INTRAVENOUS
  Filled 2013-06-27: qty 1

## 2013-06-27 MED ORDER — IBUPROFEN 600 MG PO TABS
600.0000 mg | ORAL_TABLET | Freq: Four times a day (QID) | ORAL | Status: DC | PRN
  Filled 2013-06-27 (×3): qty 1

## 2013-06-27 MED ORDER — BENZOCAINE-MENTHOL 20-0.5 % EX AERO
1.0000 "application " | INHALATION_SPRAY | CUTANEOUS | Status: DC | PRN
  Administered 2013-06-28: 1 via TOPICAL
  Filled 2013-06-27: qty 56

## 2013-06-27 MED ORDER — PENICILLIN G POTASSIUM 5000000 UNITS IJ SOLR
2.5000 10*6.[IU] | INTRAVENOUS | Status: DC
  Administered 2013-06-27: 2.5 10*6.[IU] via INTRAVENOUS
  Filled 2013-06-27 (×10): qty 2.5

## 2013-06-27 NOTE — Anesthesia Procedure Notes (Signed)
Epidural Patient location during procedure: OB Start time: 06/27/2013 1:00 PM  Staffing Performed by: anesthesiologist   Preanesthetic Checklist Completed: patient identified, site marked, surgical consent, pre-op evaluation, timeout performed, IV checked, risks and benefits discussed and monitors and equipment checked  Epidural Patient position: sitting Prep: site prepped and draped and DuraPrep Patient monitoring: continuous pulse ox and blood pressure Approach: midline Injection technique: LOR air  Needle:  Needle type: Tuohy  Needle gauge: 17 G Needle length: 9 cm and 9 Needle insertion depth: 7 cm Catheter type: closed end flexible Catheter size: 19 Gauge Catheter at skin depth: 12 cm Test dose: negative  Assessment Events: blood not aspirated, injection not painful, no injection resistance, negative IV test and no paresthesia  Additional Notes Discussed risk of headache, infection, bleeding, nerve injury and failed or incomplete block.  Patient voices understanding and wishes to proceed.  Epidural placed easily on first attempt.  No paresthesia.  Patient tolerated procedure well with no apparent complications.  Jasmine December, MD Reason for block:procedure for pain

## 2013-06-27 NOTE — H&P (Signed)
Rose Holden is a 22 y.o. female presenting for labor.  Patient at [facility].   Maternal Medical History:  Reason for admission: Rupture of membranes.  Nausea.  Contractions: Onset was 1-2 hours ago.   Frequency: regular.   Duration is approximately 3 minutes.    Fetal activity: Perceived fetal activity is normal.   Last perceived fetal movement was within the past hour.    Prenatal complications: Substance abuse.   No bleeding, cholelithiasis, HIV, hypertension, nephrolithiasis, pre-eclampsia ( Marijuana) or preterm labor.   Prenatal Complications - Diabetes: none.    OB History   Grav Para Term Preterm Abortions TAB SAB Ect Mult Living   3 2 2       2      Past Medical History  Diagnosis Date  . Medical history non-contributory    Past Surgical History  Procedure Laterality Date  . Broken leg      closed reduction  . No past surgeries     Family History: family history includes Cancer in her maternal grandmother; Hypertension in her mother. Social History:  reports that she has been smoking Cigarettes.  She has been smoking about 0.50 packs per day. She has never used smokeless tobacco. She reports that  drinks alcohol. She reports past marijuana use.   Results for orders placed during the hospital encounter of 06/27/13 (from the past 24 hour(s))  CBC     Status: Abnormal   Collection Time    06/27/13 12:07 PM      Result Value Range   WBC 7.8  4.0 - 10.5 K/uL   RBC 3.99  3.87 - 5.11 MIL/uL   Hemoglobin 11.6 (*) 12.0 - 15.0 g/dL   HCT 45.4 (*) 09.8 - 11.9 %   MCV 84.7  78.0 - 100.0 fL   MCH 29.1  26.0 - 34.0 pg   MCHC 34.3  30.0 - 36.0 g/dL   RDW 14.7  82.9 - 56.2 %   Platelets 298  150 - 400 K/uL    .  Review of Systems  Constitutional: Negative for fever, chills and weight loss.  Eyes: Negative for blurred vision.  Gastrointestinal: Negative for heartburn, nausea and vomiting.  Genitourinary: Negative for dysuria, urgency and frequency.  Skin:  Negative for itching and rash.  Neurological: Negative.  Negative for headaches.    Dilation: 3 Effacement (%): 50 Station: -2 Exam by:: L. Paschal, RN Blood pressure 128/81, pulse 86, temperature 97.8 F (36.6 C), temperature source Oral, resp. rate 18, height 5\' 2"  (1.575 m), weight 99.791 kg (220 lb), last menstrual period 09/18/2012, SpO2 100.00%. Maternal Exam:  Uterine Assessment: Contraction duration is 60 seconds. Contraction frequency is regular.   Abdomen: Patient reports no abdominal tenderness.   Fetal Exam Fetal Monitor Review: Baseline rate: 140.  Variability: moderate (6-25 bpm).       Physical Exam  Constitutional: She is oriented to person, place, and time. She appears well-developed and well-nourished.  Non-toxic appearance. She does not have a sickly appearance. She does not appear ill. No distress.  Eyes: Conjunctivae and EOM are normal. Right eye exhibits no discharge. Left eye exhibits no discharge.  Neck: Normal range of motion. Neck supple.  Cardiovascular: Normal rate, regular rhythm, normal heart sounds and intact distal pulses.   Respiratory: Effort normal and breath sounds normal.  Neurological: She is alert and oriented to person, place, and time. She has normal reflexes.  Skin: Skin is warm, dry and intact. She is not diaphoretic.  Prenatal labs: ABO, Rh: A/Positive/-- (01/21 0000) Antibody: NEG (06/05 0914) Rubella: Immune (01/21 0000) RPR: NON REAC (06/05 0914)  HBsAg: Negative (01/21 0000)  HIV: NON REACTIVE (06/05 0914)  GBS: POSITIVE (07/31 1515)   Assessment/Plan Term IUP with Spontaneous Rupture of Membranes GBS posititve  Plan: Admit to birthing suit at [facility].  Epidural per request Anticipate NSVD  Chestine Spore, MICHAEL L 06/27/2013, 11:36 AM  I examined pt and agree with documentation above and PA-S plan of care. [facility]

## 2013-06-27 NOTE — Progress Notes (Signed)
   Subjective: Pt reports comfortable with epidural.  Objective: BP 106/58  Pulse 68  Temp(Src) 97.9 F (36.6 C) (Oral)  Resp 18  Ht 5\' 2"  (1.575 m)  Wt 99.791 kg (220 lb)  BMI 40.23 kg/m2  SpO2 100%  LMP 09/18/2012      FHT:  FHR: 130's bpm, variability: moderate,  accelerations:  Present,  decelerations:  Present variables (intermittent); decel in supine position UC:   regular, every 2-4 minutes SVE:   Dilation: 5 Effacement (%): 50 Station: -3 Exam by:: muhhamed  Labs: Lab Results  Component Value Date   WBC 7.8 06/27/2013   HGB 11.6* 06/27/2013   HCT 33.8* 06/27/2013   MCV 84.7 06/27/2013   PLT 298 06/27/2013    Assessment / Plan: Augmentation of labor, progressing well  Labor: Progressing normally Preeclampsia:  n/a Fetal Wellbeing:  Category II Pain Control:  Epidural I/D:  GBS pos Anticipated MOD:  NSVD  [facility] 06/27/2013, 4:52 PM

## 2013-06-27 NOTE — MAU Note (Signed)
Pt arrived by EMS with ROM at 1000 this morning.  Clear fluid.  Denies vaginal bleeding.  U/C's per pt is 1-4 minutes apart.  Good fetal movement.

## 2013-06-27 NOTE — Progress Notes (Signed)
   Rose Holden is a 22 y.o. G3P2002 at [redacted]w[redacted]d  admitted for active labor, rupture of membranes  Subjective:  Comfortable with epidural Objective: BP 111/65  Pulse 64  Temp(Src) 97.3 F (36.3 C) (Oral)  Resp 18  Ht 5\' 2"  (1.575 m)  Wt 99.791 kg (220 lb)  BMI 40.23 kg/m2  SpO2 100%  LMP 09/18/2012    FHT:  FHR: 130 bpm, variability: moderate,  accelerations:  Present,  decelerations:  Present mild variables responsive to position changes.   UC:   irregular, every 3-7 minutes SVE:   Dilation: 6 Effacement (%): 100 Station: -2 Exam by:: fran cnm Pitocin @ 5 mu/min  Labs: Lab Results  Component Value Date   WBC 7.8 06/27/2013   HGB 11.6* 06/27/2013   HCT 33.8* 06/27/2013   MCV 84.7 06/27/2013   PLT 298 06/27/2013    Assessment / Plan: Augmentation of labor, progressing well  Labor: Progressing normally Fetal Wellbeing:  Category I Pain Control:  Epidural Anticipated MOD:  NSVD  [name],Shaley Leavens 06/27/2013, 6:45 PM

## 2013-06-27 NOTE — Anesthesia Preprocedure Evaluation (Addendum)
Anesthesia Evaluation  Patient identified by MRN, date of birth, ID band Patient awake    Reviewed: Allergy & Precautions, H&P , NPO status , Patient's Chart, lab work & pertinent test results, reviewed documented beta blocker date and time   History of Anesthesia Complications Negative for: history of anesthetic complications  Airway Mallampati: III TM Distance: >3 FB Neck ROM: full    Dental  (+) Teeth Intact   Pulmonary Current Smoker,  breath sounds clear to auscultation        Cardiovascular negative cardio ROS  Rhythm:regular Rate:Normal     Neuro/Psych negative neurological ROS  negative psych ROS   GI/Hepatic negative GI ROS, (+)     substance abuse  marijuana use,   Endo/Other  Morbid obesity  Renal/GU negative Renal ROS     Musculoskeletal   Abdominal   Peds  Hematology negative hematology ROS (+)   Anesthesia Other Findings   Reproductive/Obstetrics (+) Pregnancy                          Anesthesia Physical Anesthesia Plan  ASA: III  Anesthesia Plan: Epidural   Post-op Pain Management:    Induction:   Airway Management Planned:   Additional Equipment:   Intra-op Plan:   Post-operative Plan:   Informed Consent: I have reviewed the patients History and Physical, chart, labs and discussed the procedure including the risks, benefits and alternatives for the proposed anesthesia with the patient or authorized representative who has indicated his/her understanding and acceptance.     Plan Discussed with:   Anesthesia Plan Comments:         Anesthesia Quick Evaluation

## 2013-06-28 LAB — TYPE AND SCREEN: Antibody Screen: NEGATIVE

## 2013-06-28 LAB — ABO/RH: ABO/RH(D): A POS

## 2013-06-28 NOTE — Progress Notes (Signed)
UR chart review completed.  

## 2013-06-28 NOTE — Clinical Social Work Maternal (Signed)
    Clinical Social Work Department PSYCHOSOCIAL ASSESSMENT - MATERNAL/CHILD 06/28/2013  Patient:  Rose Holden, Rose Holden  Account Number:  [id number]  Admit Date:  06/27/2013  Marjo Bicker Name:   Justice Scales    Clinical Social Worker:  Nobie Putnam, LCSW   Date/Time:  06/28/2013 03:19 PM  Date Referred:  06/28/2013   Referral source  CN     Referred reason  Substance Abuse   Other referral source:    I:  FAMILY / HOME ENVIRONMENT Child's legal guardian:  PARENT  Guardian - Name Guardian - Age Guardian - Address  Kyle Odor [address]; [place], [place] [address]  Dexter Scales     Other household support members/support persons Name Relationship DOB  Jewel DAUGHTER 09/01/07  Earley Favor SON 04/26/11   Other support:   Family & friends    II  PSYCHOSOCIAL DATA Information Source:  Patient Interview  Event organiser Employment:   Surveyor, quantity resources:  [employer] If Medicaid - County:  [employer] Other  Food Stamps  WIC  WIC   School / Grade:   Maternity Care Coordinator / Child Services Coordination / Early Interventions:  Cultural issues impacting care:    III  STRENGTHS Strengths  Adequate Resources  Home prepared for Child (including basic supplies)  Supportive family/friends   Strength comment:    IV  RISK FACTORS AND CURRENT PROBLEMS Current Problem:  YES   Risk Factor & Current Problem Patient Issue Family Issue Risk Factor / Current Problem Comment  Substance Abuse Y N Hx of MJ use    V  SOCIAL WORK ASSESSMENT CSW referral received to assess pt's history of MJ use.  Pt admits to smoking MJ "everyday," prior to pregnancy confirmation at 5 months.  Once pregnancy was confirmed, she continued to smoke but decreased use to "once or twice a week."  She has not smoked since last month.  She denies other illegal substance use & verbalized understanding of hospital drug testing policy.  UDS collection pending, as well as meconium results.  The pt  has all the necessary supplies for the infant & good family support.  Pt appears to be bonding well with the infant & appropriate at this time.  CSW will continue to monitor drug screen results & make a referral if needed.      VI SOCIAL WORK PLAN Social Work Plan  No Further Intervention Required / No Barriers to Discharge   Type of pt/family education:   If child protective services report - county:   If child protective services report - date:   Information/referral to community resources comment:   Other social work plan:

## 2013-06-28 NOTE — H&P (Signed)
Chart reviewed and agree with management and plan.  

## 2013-06-28 NOTE — Progress Notes (Signed)
Post Partum Day 1 Subjective: no complaints, up ad lib, voiding, tolerating PO and + flatus  Objective: Blood pressure 108/68, pulse 73, temperature 97.9 F (36.6 C), temperature source Oral, resp. rate 18, height 5\' 2"  (1.575 m), weight 99.791 kg (220 lb), last menstrual period 09/18/2012, SpO2 98.00%, unknown if currently breastfeeding.  Physical Exam:  General: alert, cooperative and appears stated age Lochia: appropriate Uterine Fundus: firm DVT Evaluation: No evidence of DVT seen on physical exam. Negative Homan's sign. No cords or calf tenderness. No significant calf/ankle edema.   Recent Labs  06/27/13 1207  HGB 11.6*  HCT 33.8*    Assessment/Plan: Plan for discharge tomorrow   LOS: 1 day   CLARK, MICHAEL L 06/28/2013, 7:30 AM   I have seen and examined this patient and agree the above assessment. [name],Yarelli Decelles 06/28/2013 7:55 AM

## 2013-06-29 MED ORDER — IBUPROFEN 600 MG PO TABS: 600.0000 mg | ORAL_TABLET | Freq: Four times a day (QID) | ORAL | Status: DC | PRN

## 2013-06-29 NOTE — Discharge Summary (Signed)
Obstetric Discharge Summary Reason for Admission: onset of labor and spontaneous abortion Prenatal Procedures: none Intrapartum Procedures: spontaneous vaginal delivery and GBS prophylaxis Postpartum Procedures: none Complications-Operative and Postpartum: none Hemoglobin  Date Value Range Status  06/27/2013 11.6* 12.0 - 15.0 g/dL Final  1/61/0960 45.4   Final     HCT  Date Value Range Status  06/27/2013 33.8* 36.0 - 46.0 % Final  12/04/2012 36   Final    Physical Exam:  General: alert, cooperative, appears stated age and no distress Lochia: appropriate Uterine Fundus: Firm U-1 Incision: NA DVT Evaluation: No evidence of DVT seen on physical exam. Negative Homan's sign. No cords or calf tenderness. No significant calf/ankle edema.  Discharge Diagnoses: Term Pregnancy-delivered  Discharge Information: Date: 06/29/2013 Activity: unrestricted, pelvic rest and 6wks Diet: routine Medications: PNV and Ibuprofen Condition: stable Instructions: refer to practice specific booklet Discharge to: home MOC: nexplanon MOF: [place]  Newborn Data: Live born female  Birth Weight: 7 lb 4.2 oz (3294 g) APGAR: 7, 9  Home with mother.  Jolyn Lent RYAN 06/29/2013, 9:07 AM

## 2013-07-05 NOTE — Anesthesia Postprocedure Evaluation (Signed)
  Anesthesia Post-op Note  Patient stable following vaginal delivery.  

## 2013-07-24 ENCOUNTER — Encounter: Payer: Self-pay | Admitting: Advanced Practice Midwife

## 2013-08-13 ENCOUNTER — Ambulatory Visit: Payer: Self-pay | Admitting: Advanced Practice Midwife

## 2013-08-21 ENCOUNTER — Ambulatory Visit: Payer: Self-pay | Admitting: Advanced Practice Midwife

## 2013-09-05 ENCOUNTER — Ambulatory Visit: Payer: Medicaid Other | Admitting: Adult Health

## 2013-09-10 ENCOUNTER — Ambulatory Visit: Payer: Medicaid Other | Admitting: Advanced Practice Midwife

## 2013-09-11 ENCOUNTER — Ambulatory Visit: Payer: Medicaid Other | Admitting: Advanced Practice Midwife

## 2013-09-17 ENCOUNTER — Ambulatory Visit: Payer: Medicaid Other | Admitting: Advanced Practice Midwife

## 2014-02-25 ENCOUNTER — Encounter (HOSPITAL_COMMUNITY): Payer: Self-pay | Admitting: Emergency Medicine

## 2014-02-25 ENCOUNTER — Emergency Department (HOSPITAL_COMMUNITY)
Admission: EM | Admit: 2014-02-25 | Discharge: 2014-02-26 | Disposition: A | Discharge status: Home / Self Care | Payer: Medicaid Other | Attending: Emergency Medicine | Admitting: Emergency Medicine

## 2014-02-25 DIAGNOSIS — N760 Acute vaginitis: Secondary | ICD-10-CM | POA: Insufficient documentation

## 2014-02-25 DIAGNOSIS — B9689 Other specified bacterial agents as the cause of diseases classified elsewhere: Secondary | ICD-10-CM | POA: Insufficient documentation

## 2014-02-25 DIAGNOSIS — Z3202 Encounter for pregnancy test, result negative: Secondary | ICD-10-CM | POA: Insufficient documentation

## 2014-02-25 DIAGNOSIS — F17200 Nicotine dependence, unspecified, uncomplicated: Secondary | ICD-10-CM | POA: Insufficient documentation

## 2014-02-25 DIAGNOSIS — Z79899 Other long term (current) drug therapy: Secondary | ICD-10-CM | POA: Insufficient documentation

## 2014-02-25 DIAGNOSIS — L089 Local infection of the skin and subcutaneous tissue, unspecified: Secondary | ICD-10-CM | POA: Insufficient documentation

## 2014-02-25 DIAGNOSIS — A499 Bacterial infection, unspecified: Secondary | ICD-10-CM | POA: Insufficient documentation

## 2014-02-25 NOTE — ED Notes (Signed)
Pt states she was bit by a spider on her L. Lower leg. L. Lower leg has small area with white discharge at the center. In addition, pt states that her boyfriend has trichomonas and she thinks she does as well because she is having vaginal discharge.

## 2014-02-25 NOTE — ED Notes (Signed)
Patient states "I think I was bitten by a spider." Noted pea-sized wound to lower left leg. Patient also states "My boyfriend was just diagnosed with an STD and I want to get checked for that."

## 2014-02-26 LAB — POC URINE PREG, ED: Preg Test, Ur: NEGATIVE

## 2014-02-26 LAB — URINALYSIS, ROUTINE W REFLEX MICROSCOPIC
BILIRUBIN URINE: NEGATIVE
Glucose, UA: NEGATIVE mg/dL
HGB URINE DIPSTICK: NEGATIVE
KETONES UR: NEGATIVE mg/dL
Leukocytes, UA: NEGATIVE
Nitrite: NEGATIVE
PROTEIN: NEGATIVE mg/dL
Specific Gravity, Urine: 1.015 (ref 1.005–1.030)
UROBILINOGEN UA: 0.2 mg/dL (ref 0.0–1.0)
pH: 6.5 (ref 5.0–8.0)

## 2014-02-26 LAB — WET PREP, GENITAL
TRICH WET PREP: NONE SEEN
Yeast Wet Prep HPF POC: NONE SEEN

## 2014-02-26 LAB — HIV ANTIBODY (ROUTINE TESTING W REFLEX): HIV 1&2 Ab, 4th Generation: NONREACTIVE

## 2014-02-26 LAB — RPR

## 2014-02-26 MED ORDER — LIDOCAINE HCL (PF) 1 % IJ SOLN
INTRAMUSCULAR | Status: AC
  Administered 2014-02-26: 5 mL
  Filled 2014-02-26: qty 5

## 2014-02-26 MED ORDER — CEFTRIAXONE SODIUM 250 MG IJ SOLR
250.0000 mg | Freq: Once | INTRAMUSCULAR | Status: AC
  Administered 2014-02-26: 250 mg via INTRAMUSCULAR
  Filled 2014-02-26: qty 250

## 2014-02-26 MED ORDER — METRONIDAZOLE 500 MG PO TABS: 500.0000 mg | ORAL_TABLET | Freq: Two times a day (BID) | ORAL | Status: DC

## 2014-02-26 MED ORDER — AZITHROMYCIN 250 MG PO TABS
1000.0000 mg | ORAL_TABLET | Freq: Once | ORAL | Status: AC
  Administered 2014-02-26: 1000 mg via ORAL
  Filled 2014-02-26: qty 4

## 2014-02-26 NOTE — ED Provider Notes (Signed)
CSN: [id number]     Arrival date & time 02/25/14  2219 History   First MD Initiated Contact with Patient 02/25/14 2302     Chief Complaint  Patient presents with  . Wound Infection  . SEXUALLY TRANSMITTED DISEASE   Rose Holden is a 23 y.o. female presenting with 2 complaints, the first being scant white vaginal discharge for the past 2 days with concern for std as she was told by her boyfriend he was diagnosed with trichomonas.  She denies vaginal pain, burning, itching and denies pelvic cramping, back pain, nausea, vomiting or abdominal pain.  She is sexually active with just her boyfriend and does not use protection.  Her second complaint is for a wound that has developed on her left lower anterior leg. It is raised with a white center and tender.  She denies any injury to the site,  Believes she may have a spider bite although does not recall the inciting event.     (Consider location/radiation/quality/duration/timing/severity/associated sxs/prior Treatment) The history is provided by the patient.    Past Medical History  Diagnosis Date  . Medical history non-contributory    Past Surgical History  Procedure Laterality Date  . Broken leg      closed reduction  . No past surgeries     Family History  Problem Relation Age of Onset  . Hypertension Mother   . Cancer Maternal Grandmother    History  Substance Use Topics  . Smoking status: Current Every Day Smoker -- 0.50 packs/day    Types: Cigarettes  . Smokeless tobacco: Never Used  . Alcohol Use: Yes     Comment: occasionally   OB History   Grav Para Term Preterm Abortions TAB SAB Ect Mult Living   3 3 3       3      Review of Systems  Constitutional: Negative for fever.  HENT: Negative for congestion and sore throat.   Eyes: Negative.   Respiratory: Negative for chest tightness and shortness of breath.   Cardiovascular: Negative for chest pain.  Gastrointestinal: Negative for nausea and abdominal pain.   Genitourinary: Positive for vaginal discharge. Negative for dysuria, urgency, flank pain and pelvic pain.  Musculoskeletal: Negative for arthralgias, joint swelling and neck pain.  Skin: Positive for wound. Negative for rash.  Neurological: Negative for dizziness, weakness, light-headedness, numbness and headaches.  Psychiatric/Behavioral: Negative.       Allergies  Review of patient's allergies indicates no known allergies.  Home Medications   Prior to Admission medications   Medication Sig Start Date End Date Taking? Authorizing Provider  calcium carbonate (TUMS - DOSED IN MG ELEMENTAL CALCIUM) 500 MG chewable tablet Chew 2 tablets by mouth 2 (two) times daily as needed for heartburn.    Historical Provider, MD  ibuprofen (ADVIL,MOTRIN) 600 MG tablet Take 1 tablet (600 mg total) by mouth every 6 (six) hours as needed (pain scale < 4). 06/29/13   Minta BalsamMichael R Odom, MD  metroNIDAZOLE (FLAGYL) 500 MG tablet Take 1 tablet (500 mg total) by mouth 2 (two) times daily. 02/26/14   Burgess AmorJulie Teresa Lemmerman, PA-C  Prenatal Vit-Fe Fumarate-FA (PRENATAL COMPLETE) 14-0.4 MG TABS Take 1 tablet by mouth daily. 11/07/12   Laray AngerKathleen M McManus, DO   BP 125/72  Pulse 68  Temp(Src) 98.9 F (37.2 C) (Oral)  Resp 16  Ht 5\' 2"  (1.575 m)  Wt 200 lb (90.719 kg)  BMI 36.57 kg/m2  SpO2 97%  LMP 02/12/2014 Physical Exam  Nursing note and  vitals reviewed. Constitutional: She appears well-developed and well-nourished.  HENT:  Head: Normocephalic and atraumatic.  Eyes: Conjunctivae are normal.  Neck: Normal range of motion.  Cardiovascular: Normal rate, regular rhythm, normal heart sounds and intact distal pulses.   Pulmonary/Chest: Effort normal and breath sounds normal. She has no wheezes.  Abdominal: Soft. Bowel sounds are normal. There is no tenderness.  Genitourinary: Uterus normal. Uterus is not tender. Cervix exhibits no motion tenderness, no discharge and no friability. Right adnexum displays no mass, no  tenderness and no fullness. Left adnexum displays no mass, no tenderness and no fullness. Vaginal discharge found.  Musculoskeletal: Normal range of motion.  Neurological: She is alert.  Skin: Skin is warm and dry.  Small raised pustule left lower anterior leg, approx 3 mm diameter with slight edema, no surrounding erythema or red streaking.  Psychiatric: She has a normal mood and affect.    ED Course  INCISION AND DRAINAGE Date/Time: 02/25/2014 11:45 PM Performed by: Burgess AmorIDOL, Brinlyn Cena Authorized by: Burgess AmorIDOL, Jaris Kohles Consent: Verbal consent obtained. Risks and benefits: risks, benefits and alternatives were discussed Consent given by: patient Indications for incision and drainage: pustule. Body area: lower extremity Location details: left leg Anesthesia method: none. Needle gauge: 20 Incision type: several punctures made to remove roof of pustule. Complexity: simple Drainage: purulent Drainage amount: scant Wound treatment: wound left open Packing material: none Patient tolerance: Patient tolerated the procedure well with no immediate complications.   (including critical care time) Labs Review Labs Reviewed  WET PREP, GENITAL - Abnormal; Notable for the following:    Clue Cells Wet Prep HPF POC MODERATE (*)    WBC, Wet Prep HPF POC MODERATE (*)    All other components within normal limits  GC/CHLAMYDIA PROBE AMP  RPR  URINALYSIS, ROUTINE W REFLEX MICROSCOPIC  HIV ANTIBODY (ROUTINE TESTING)  POC URINE PREG, ED    Imaging Review No results found.   EKG Interpretation None      MDM   Final diagnoses:  Bacterial vaginosis  Skin pustule    Patients labs and/or radiological studies were viewed and considered during the medical decision making and disposition process. Pts labs reviewed and discussed with her.  She opted for tx for gc/chlamydia and was treated with rocephin and zithromax while here.  She was prescribed flagyl for her bv.  Advised her trichomonas was negative.   Aware cultures pending.  Advised prn f/u with her gyn if sx persist.  Advised safe sex and to avoid intercourse until abx is completed or sx resolved.  Warm compresses to the leg pustule which is now opened, no evidence for cellulitis or retained infection/pus.    Burgess AmorJulie Keajah Killough, PA-C 02/28/14 1355  Burgess AmorJulie Shray Hunley, PA-C 02/28/14 1355

## 2014-02-26 NOTE — Discharge Instructions (Signed)
Bacterial Vaginosis Bacterial vaginosis is a vaginal infection that occurs when the normal balance of bacteria in the vagina is disrupted. It results from an overgrowth of certain bacteria. This is the most common vaginal infection in women of childbearing age. Treatment is important to prevent complications, especially in pregnant women, as it can cause a premature delivery. CAUSES  Bacterial vaginosis is caused by an increase in harmful bacteria that are normally present in smaller amounts in the vagina. Several different kinds of bacteria can cause bacterial vaginosis. However, the reason that the condition develops is not fully understood. RISK FACTORS Certain activities or behaviors can put you at an increased risk of developing bacterial vaginosis, including:  Having a new sex partner or multiple sex partners.  Douching.  Using an intrauterine device (IUD) for contraception. Women do not get bacterial vaginosis from toilet seats, bedding, swimming pools, or contact with objects around them. SIGNS AND SYMPTOMS  Some women with bacterial vaginosis have no signs or symptoms. Common symptoms include:  Grey vaginal discharge.  A fishlike odor with discharge, especially after sexual intercourse.  Itching or burning of the vagina and vulva.  Burning or pain with urination. DIAGNOSIS  Your health care provider will take a medical history and examine the vagina for signs of bacterial vaginosis. A sample of vaginal fluid may be taken. Your health care provider will look at this sample under a microscope to check for bacteria and abnormal cells. A vaginal pH test may also be done.  TREATMENT  Bacterial vaginosis may be treated with antibiotic medicines. These may be given in the form of a pill or a vaginal cream. A second round of antibiotics may be prescribed if the condition comes back after treatment.  HOME CARE INSTRUCTIONS   Only take over-the-counter or prescription medicines as  directed by your health care provider.  If antibiotic medicine was prescribed, take it as directed. Make sure you finish it even if you start to feel better.  Do not have sex until treatment is completed.  Tell all sexual partners that you have a vaginal infection. They should see their health care provider and be treated if they have problems, such as a mild rash or itching.  Practice safe sex by using condoms and only having one sex partner. SEEK MEDICAL CARE IF:   Your symptoms are not improving after 3 days of treatment.  You have increased discharge or pain.  You have a fever. MAKE SURE YOU:   Understand these instructions.  Will watch your condition.  Will get help right away if you are not doing well or get worse. FOR MORE INFORMATION  Centers for Disease Control and Prevention, Division of STD Prevention: SolutionApps.co.zawww.cdc.gov/std American Sexual Health Association (ASHA): www.ashastd.org  Document Released: 10/31/2005 Document Revised: 08/21/2013 Document Reviewed: 06/12/2013 [facility] Patient Information 2014 [place], [place].  You have been treated  for gonorrhea and chlamydia tonight, although this test will not be back for several days.  You do have bacterial vaginosis and should take the flagyl prescribed until gone for this infection.  Do not drink alcohol while taking this medication.  As discussed apply warm compresses several times daily to the skin infection on your leg.  Now that this pustule has been drained,  It should heal without problem.  Return however if you develop any worsened symptoms.

## 2014-02-27 LAB — GC/CHLAMYDIA PROBE AMP
CT PROBE, AMP APTIMA: NEGATIVE
GC PROBE AMP APTIMA: NEGATIVE

## 2014-03-03 NOTE — ED Provider Notes (Signed)
Medical screening examination/treatment/procedure(s) were performed by non-physician practitioner and as supervising physician I was immediately available for consultation/collaboration.   EKG Interpretation None        Misk Galentine F Welles Walthall, MD 03/03/14 1711 

## 2014-05-09 ENCOUNTER — Encounter (HOSPITAL_COMMUNITY): Payer: Self-pay | Admitting: Emergency Medicine

## 2014-05-09 ENCOUNTER — Emergency Department (HOSPITAL_COMMUNITY)
Admission: EM | Admit: 2014-05-09 | Discharge: 2014-05-09 | Disposition: A | Discharge status: Home / Self Care | Payer: Medicaid Other | Attending: Emergency Medicine | Admitting: Emergency Medicine

## 2014-05-09 DIAGNOSIS — R110 Nausea: Secondary | ICD-10-CM | POA: Insufficient documentation

## 2014-05-09 DIAGNOSIS — R42 Dizziness and giddiness: Secondary | ICD-10-CM | POA: Insufficient documentation

## 2014-05-09 DIAGNOSIS — F17200 Nicotine dependence, unspecified, uncomplicated: Secondary | ICD-10-CM | POA: Insufficient documentation

## 2014-05-09 NOTE — ED Provider Notes (Signed)
CSN: [id number]     Arrival date & time 05/09/14  1838 History   First MD Initiated Contact with Patient 05/09/14 1846     Chief Complaint  Patient presents with  . Dizziness  . Weakness      HPI  Patient presents after an episode of near syncope. Patient donated plasma earlier today, subsequently felt lightheaded, nauseous. There is no complete syncope, no chest pain throughout. On my exam the patient only complains of mild headache, no weakness anywhere confusion, disorientation ongoing chest pain, dyspnea, abdominal pain, nausea.   Past Medical History  Diagnosis Date  . Medical history non-contributory    Past Surgical History  Procedure Laterality Date  . Broken leg      closed reduction  . No past surgeries     Family History  Problem Relation Age of Onset  . Hypertension Mother   . Cancer Maternal Grandmother    History  Substance Use Topics  . Smoking status: Current Every Day Smoker -- 0.50 packs/day    Types: Cigarettes  . Smokeless tobacco: Never Used  . Alcohol Use: Yes     Comment: occasionally   OB History   Grav Para Term Preterm Abortions TAB SAB Ect Mult Living   3 3 3       3      Review of Systems  Constitutional:       Per HPI, otherwise negative  HENT:       Per HPI, otherwise negative  Respiratory:       Per HPI, otherwise negative  Cardiovascular:       Per HPI, otherwise negative  Gastrointestinal: Positive for nausea. Negative for vomiting.  Endocrine:       Negative aside from HPI  Genitourinary:       Neg aside from HPI   Musculoskeletal:       Per HPI, otherwise negative  Skin: Negative.   Neurological: Positive for light-headedness. Negative for syncope and weakness.      Allergies  Review of patient's allergies indicates no known allergies.  Home Medications   Prior to Admission medications   Not on File   BP 123/89  Pulse 92  Temp(Src) 98.7 F (37.1 C)  Resp 18  Ht 5\' 2"  (1.575 m)  Wt 213 lb (96.616 kg)   BMI 38.95 kg/m2  SpO2 99%  LMP 05/09/2014 Physical Exam  Nursing note and vitals reviewed. Constitutional: She is oriented to person, place, and time. She appears well-developed and well-nourished. No distress.  HENT:  Head: Normocephalic and atraumatic.  Eyes: Conjunctivae and EOM are normal.  Cardiovascular: Normal rate and regular rhythm.   Pulmonary/Chest: Effort normal and breath sounds normal. No stridor. No respiratory distress.  Abdominal: She exhibits no distension.  Musculoskeletal: She exhibits no edema.  Neurological: She is alert and oriented to person, place, and time. No cranial nerve deficit.  Skin: Skin is warm and dry.  Psychiatric: She has a normal mood and affect.    ED Course  Procedures (including critical care time)   EKG Interpretation   Date/Time:  Friday May 09 2014 19:15:04 EDT Ventricular Rate:  95 PR Interval:  136 QRS Duration: 79 QT Interval:  448 QTC Calculation: 563 R Axis:   72 Text Interpretation:  Sinus rhythm Borderline T wave abnormalities  Prolonged QT interval Sinus rhythm T wave abnormality Abnormal ekg  Confirmed by Gerhard MunchLOCKWOOD, Anis Degidio  MD [phone]) on 05/09/2014 7:17:54 PM      MDM   Final diagnoses:  Lightheadedness    Patient presents evidence of lightheadedness, likely precipitated by diminution of plasma. Patient is awake alert and hemodynamically stable, and in no distress, neurologically intact. Patient's EKG is reassuring, she is discharged in stable condition with primary care followup.    Gerhard Munchobert Rockford Leinen, MD 05/09/14 Ernestina [id number]

## 2014-05-09 NOTE — Discharge Instructions (Signed)
As discussed, your evaluation today has been largely reassuring.  But, it is important that you monitor your condition carefully, and do not hesitate to return to the ED if you develop new, or concerning changes in your condition. ? ?Otherwise, please follow-up with your physician for appropriate ongoing care. ? ?

## 2014-05-09 NOTE — ED Notes (Addendum)
EMS reports pt gave plasma about 2 hours ago and is c/o weakness and dizziness.   Pt reports she has not eaten nor had fluids post plasma.  Upon EMS arrival, unable to palpate radial pulse, but pt was talking and oriented during episode.  NAD upon arrival.  GCS 15

## 2014-05-09 NOTE — ED Notes (Signed)
MD at bedside. 

## 2014-08-14 ENCOUNTER — Emergency Department (HOSPITAL_COMMUNITY)
Admission: EM | Admit: 2014-08-14 | Discharge: 2014-08-15 | Disposition: A | Discharge status: Other Acute Inpatient Hospital | Payer: Medicaid Other | Attending: Emergency Medicine | Admitting: Emergency Medicine

## 2014-08-14 ENCOUNTER — Encounter (HOSPITAL_COMMUNITY): Payer: Self-pay | Admitting: Emergency Medicine

## 2014-08-14 DIAGNOSIS — T450X2A Poisoning by antiallergic and antiemetic drugs, intentional self-harm, initial encounter: Secondary | ICD-10-CM | POA: Insufficient documentation

## 2014-08-14 DIAGNOSIS — Z046 Encounter for general psychiatric examination, requested by authority: Secondary | ICD-10-CM | POA: Diagnosis present

## 2014-08-14 DIAGNOSIS — T444X2A Poisoning by predominantly alpha-adrenoreceptor agonists, intentional self-harm, initial encounter: Secondary | ICD-10-CM | POA: Insufficient documentation

## 2014-08-14 DIAGNOSIS — T50902A Poisoning by unspecified drugs, medicaments and biological substances, intentional self-harm, initial encounter: Secondary | ICD-10-CM

## 2014-08-14 DIAGNOSIS — Z720 Tobacco use: Secondary | ICD-10-CM | POA: Diagnosis not present

## 2014-08-14 DIAGNOSIS — F1410 Cocaine abuse, uncomplicated: Secondary | ICD-10-CM | POA: Diagnosis not present

## 2014-08-14 DIAGNOSIS — T39312A Poisoning by propionic acid derivatives, intentional self-harm, initial encounter: Secondary | ICD-10-CM | POA: Diagnosis not present

## 2014-08-14 DIAGNOSIS — Z3202 Encounter for pregnancy test, result negative: Secondary | ICD-10-CM | POA: Insufficient documentation

## 2014-08-14 LAB — URINALYSIS, ROUTINE W REFLEX MICROSCOPIC
BILIRUBIN URINE: NEGATIVE
Glucose, UA: NEGATIVE mg/dL
HGB URINE DIPSTICK: NEGATIVE
KETONES UR: NEGATIVE mg/dL
Leukocytes, UA: NEGATIVE
NITRITE: NEGATIVE
PH: 6 (ref 5.0–8.0)
Protein, ur: NEGATIVE mg/dL
Urobilinogen, UA: 0.2 mg/dL (ref 0.0–1.0)

## 2014-08-14 LAB — CBC WITH DIFFERENTIAL/PLATELET
BASOS PCT: 0 % (ref 0–1)
Basophils Absolute: 0 10*3/uL (ref 0.0–0.1)
EOS ABS: 0.5 10*3/uL (ref 0.0–0.7)
Eosinophils Relative: 8 % — ABNORMAL HIGH (ref 0–5)
HEMATOCRIT: 36.9 % (ref 36.0–46.0)
HEMOGLOBIN: 12.5 g/dL (ref 12.0–15.0)
LYMPHS ABS: 2.4 10*3/uL (ref 0.7–4.0)
Lymphocytes Relative: 38 % (ref 12–46)
MCH: 30 pg (ref 26.0–34.0)
MCHC: 33.9 g/dL (ref 30.0–36.0)
MCV: 88.7 fL (ref 78.0–100.0)
MONO ABS: 0.3 10*3/uL (ref 0.1–1.0)
MONOS PCT: 4 % (ref 3–12)
Neutro Abs: 3.2 10*3/uL (ref 1.7–7.7)
Neutrophils Relative %: 50 % (ref 43–77)
Platelets: 353 10*3/uL (ref 150–400)
RBC: 4.16 MIL/uL (ref 3.87–5.11)
RDW: 13.1 % (ref 11.5–15.5)
WBC: 6.4 10*3/uL (ref 4.0–10.5)

## 2014-08-14 LAB — COMPREHENSIVE METABOLIC PANEL
ALBUMIN: 4.1 g/dL (ref 3.5–5.2)
ALK PHOS: 53 U/L (ref 39–117)
ALT: 12 U/L (ref 0–35)
ANION GAP: 12 (ref 5–15)
AST: 18 U/L (ref 0–37)
BUN: 17 mg/dL (ref 6–23)
CHLORIDE: 102 meq/L (ref 96–112)
CO2: 24 mEq/L (ref 19–32)
CREATININE: 0.96 mg/dL (ref 0.50–1.10)
Calcium: 9.4 mg/dL (ref 8.4–10.5)
GFR calc non Af Amer: 82 mL/min — ABNORMAL LOW (ref >90)
GLUCOSE: 99 mg/dL (ref 70–99)
Potassium: 3.9 mEq/L (ref 3.7–5.3)
Sodium: 138 mEq/L (ref 137–147)
TOTAL PROTEIN: 7.4 g/dL (ref 6.0–8.3)

## 2014-08-14 LAB — PREGNANCY, URINE: Preg Test, Ur: NEGATIVE

## 2014-08-14 LAB — RAPID URINE DRUG SCREEN, HOSP PERFORMED
AMPHETAMINES: NOT DETECTED
BARBITURATES: NOT DETECTED
BENZODIAZEPINES: NOT DETECTED
COCAINE: POSITIVE — AB
OPIATES: NOT DETECTED
TETRAHYDROCANNABINOL: NOT DETECTED

## 2014-08-14 LAB — ACETAMINOPHEN LEVEL

## 2014-08-14 LAB — ETHANOL

## 2014-08-14 LAB — PROTIME-INR
INR: 0.94 (ref 0.00–1.49)
Prothrombin Time: 12.6 seconds (ref 11.6–15.2)

## 2014-08-14 LAB — SALICYLATE LEVEL: Salicylate Lvl: <2 mg/dL — ABNORMAL LOW (ref 2.8–20.0)

## 2014-08-14 MED ORDER — ONDANSETRON HCL 4 MG/2ML IJ SOLN
4.0000 mg | Freq: Once | INTRAMUSCULAR | Status: AC
  Administered 2014-08-14: 4 mg via INTRAVENOUS
  Filled 2014-08-14: qty 2

## 2014-08-14 MED ORDER — SODIUM CHLORIDE 0.9 % IV BOLUS (SEPSIS)
2000.0000 mL | Freq: Once | INTRAVENOUS | Status: AC
  Administered 2014-08-14: 2000 mL via INTRAVENOUS

## 2014-08-14 NOTE — ED Notes (Signed)
In to talk to pt.  Pt awake and eating.  Pt states she did take some pills and wanted to hurt herself.   Pt advised that she is a voluntary commitment at this time.  Pt cooperative..Rose Holden

## 2014-08-14 NOTE — ED Notes (Signed)
Patient sleeping at this time.
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
## 2014-08-14 NOTE — ED Notes (Signed)
Patient reported to TTS that she took advil PM in attempts to die.

## 2014-08-14 NOTE — ED Notes (Signed)
Patient sleeping in bed at this time.

## 2014-08-14 NOTE — BH Assessment (Signed)
Relayed results of assessment to Donell SievertSpencer Simon, PA. Per Donell SievertSpencer Simon, PA pt meets inpt criteria. Pt can not be referred out until she is medically cleared around 0800. [facility]. So TTS should refer pt out once she is medically cleared.   EDP, Dr. Judd Lienelo is in agreement with recommendation.  Informed Christy,RN of plan to seek placement.    Clista BernhardtNancy Chue Berkovich, [facility] Triage Specialist 08/14/2014 3:54 AM

## 2014-08-14 NOTE — ED Notes (Signed)
TTS consult completed 

## 2014-08-14 NOTE — ED Notes (Signed)
Pt reports taking aprox 20 Advil PM about 2 hours ago. Pt reports intentionally trying to harm self.  Pt denies HI.  Denies treatment for depression.

## 2014-08-14 NOTE — ED Provider Notes (Signed)
CSN: [id number]     Arrival date & time 08/14/14  0142 History   First MD Initiated Contact with Patient 08/14/14 0206     Chief Complaint  Patient presents with  . V70.1     (Consider location/radiation/quality/duration/timing/severity/associated sxs/prior Treatment) HPI Comments: Patient is a 23 year old female who presents for evaluation of depression and suicidal ideation. She states that she took 20 Advil PM approximately 2 hours prior to arrival here. She tells me this was an attempt to harm herself. She will not offer further information as to what prompted her to do this, however she tells me that "nothing is good in her life".  Patient is a 23 y.o. female presenting with Overdose. The history is provided by the patient.  Drug Overdose This is a new problem. The current episode started 1 to 2 hours ago. The problem occurs constantly. The problem has not changed since onset.Pertinent negatives include no chest pain, no abdominal pain, no headaches and no shortness of breath. Nothing aggravates the symptoms. Nothing relieves the symptoms. She has tried nothing for the symptoms. The treatment provided no relief.    Past Medical History  Diagnosis Date  . Medical history non-contributory    Past Surgical History  Procedure Laterality Date  . Broken leg      closed reduction  . No past surgeries     Family History  Problem Relation Age of Onset  . Hypertension Mother   . Cancer Maternal Grandmother    History  Substance Use Topics  . Smoking status: Current Every Day Smoker -- 0.50 packs/day    Types: Cigarettes  . Smokeless tobacco: Never Used  . Alcohol Use: Yes     Comment: occasionally   OB History   Grav Para Term Preterm Abortions TAB SAB Ect Mult Living   3 3 3       3      Review of Systems  Respiratory: Negative for shortness of breath.   Cardiovascular: Negative for chest pain.  Gastrointestinal: Negative for abdominal pain.  Neurological: Negative for  headaches.  All other systems reviewed and are negative.     Allergies  Review of patient's allergies indicates no known allergies.  Home Medications   Prior to Admission medications   Not on File   BP 147/107  Pulse 73  Temp(Src) 98.1 F (36.7 C) (Oral)  Resp 18  Ht 5\' 2"  (1.575 m)  Wt 213 lb (96.616 kg)  BMI 38.95 kg/m2  SpO2 100%  LMP 07/17/2014 Physical Exam  Nursing note and vitals reviewed. Constitutional: She is oriented to person, place, and time. She appears well-developed and well-nourished. No distress.  HENT:  Head: Normocephalic and atraumatic.  Mouth/Throat: Oropharynx is clear and moist.  Eyes: EOM are normal. Pupils are equal, round, and reactive to light.  Neck: Normal range of motion. Neck supple.  Cardiovascular: Normal rate and regular rhythm.  Exam reveals no gallop and no friction rub.   No murmur heard. Pulmonary/Chest: Effort normal and breath sounds normal. No respiratory distress. She has no wheezes.  Abdominal: Soft. Bowel sounds are normal. She exhibits no distension. There is no tenderness.  Musculoskeletal: Normal range of motion.  Neurological: She is alert and oriented to person, place, and time. No cranial nerve deficit. Coordination normal.  Skin: Skin is warm and dry. She is not diaphoretic.    ED Course  Procedures (including critical care time) Labs Review Labs Reviewed  CBC WITH DIFFERENTIAL - Abnormal; Notable for the following:  Eosinophils Relative 8 (*)    All other components within normal limits  COMPREHENSIVE METABOLIC PANEL - Abnormal; Notable for the following:    Total Bilirubin <0.2 (*)    GFR calc non Af Amer 82 (*)    All other components within normal limits  SALICYLATE LEVEL - Abnormal; Notable for the following:    Salicylate Lvl <2.0 (*)    All other components within normal limits  ETHANOL  PROTIME-INR  ACETAMINOPHEN LEVEL  URINALYSIS, ROUTINE W REFLEX MICROSCOPIC  PREGNANCY, URINE  URINE RAPID DRUG  SCREEN (HOSP PERFORMED)    Imaging Review No results found.   EKG Interpretation   Date/Time:  Thursday August 14 2014 02:03:43 EDT Ventricular Rate:  67 PR Interval:  142 QRS Duration: 79 QT Interval:  427 QTC Calculation: 451 R Axis:   59 Text Interpretation:  Sinus rhythm Normal ECG Confirmed by DELOS  MD,  Judie Hollick ([id number]) on 08/14/2014 3:25:39 AM      MDM   Final diagnoses:  None    Laboratory studies and workup are essentially unremarkable. She will be evaluated by TTS and the disposition will be determined once this consult is complete. Care signed out to oncoming provider at shift change.    Geoffery Lyons, MD 08/14/14 [phone]

## 2014-08-14 NOTE — ED Notes (Signed)
Pt. Requesting to use the phone. Explained to pt. That she can have 2, 5 minute conversations a day.  Pt. Asking if people call to talk to her if she will be given the phone calls. Explained to pt. That she could not receive phone calls. Pt. Agitated.

## 2014-08-14 NOTE — BH Assessment (Addendum)
Per Dr. Judd Lienelo pt will need to be observed for 4-6 hours, she is able to be assessed and can be referred for placement later in AM if needed. Should be medically cleared by 0800. Pt was not very forthcoming with EDP, told him everything was bad, nothing good in her life, but was not explicit.   Requested cart be placed with pt for assessment.   Assessment to begin shortly.   Rose BernhardtNancy Jyllian Haynie, [facility] Triage Specialist 08/14/2014 3:29 AM

## 2014-08-14 NOTE — ED Notes (Signed)
Spoke with Onalee Huaavid from poison control. Onalee HuaDavid stated that patient should be monitored for 6 hours. Stated that you would expect to see tachycardia, possible prolonged QT interval, lethargy, and possible agitation.

## 2014-08-14 NOTE — BH Assessment (Addendum)
[facility] Assessment Progress Note  The following facilities have been contacted to refer pt with results as noted:  *[facility]: at 11:54 Jerilynn SomCalvin reports that he is uncertain about their admitting status.  He will check and call me back.  Results pending at this time. *[facility]: at 13:09 Albin FellingCarla reports that they are at capacity. [facility]: at 13:10 call rolled to voice mail for Jiles Haroldony Blanton and a message was left.  Response pending at this time. [facility]: at 13:12 a message was left for Darlene.  At 13:23 she called back to inform me that they only have geriatric beds at this time. Ciccone Daniels*[facility]: several calls were placed starting at 13:15.  In all cases they rolled to voice mail and a message was left.  Response pending at this time. Christell Constant*[facility]: at 13:18 Victorino DikeJennifer reports that they are unlikely to have beds, but welcomes referral.  Documentation was faxed. *[facility]: at 13:20 they report that they are at capacity. [facility]: several calls were placed starting at 13:15.  In all cases they rolled to voice mail and a message was left.  Response pending at this time.  Doylene Canninghomas Nayely Dingus, MA Triage Specialist 08/14/2014 @ 16:20   Addendum:  Further referral calls have been placed with results as noted:  *[facility]: per Jerilynn Somalvin at 16:59 they are at capacity. Earlene Plater*[facility]: beds are available and referral is welcome. Numerous attempts have been made to fax information without success. At their recommendation fax has now been sent to their geriatric unit.  Results are pending at this time. *[facility]: per Ama at 17:03 they are at capacity [facility]: per Wynona Caneshristine at 17:09 they are at capacity [facility]: per Ponciano OrtEd Robinson at 17:09 they are at capacity [facility]: at 17:11 call rolled to voice message, which welcomed referral.  Documentation faxed; results are pending. [facility]: per Rosey Batheresa at 17:24 they are at  capacity. [facility]: numerous calls were placed with no one picking up and no voice mail.  Doylene Canninghomas Jaxzen Vanhorn, MA Triage Specialist 08/14/2014 @ 18:01

## 2014-08-15 ENCOUNTER — Inpatient Hospital Stay (HOSPITAL_COMMUNITY)
Admission: AD | Admit: 2014-08-15 | Discharge: 2014-08-19 | DRG: 885 | Disposition: A | Discharge status: Home / Self Care | Payer: MEDICAID | Source: Intra-hospital | Attending: Psychiatry | Admitting: Psychiatry

## 2014-08-15 ENCOUNTER — Encounter (HOSPITAL_COMMUNITY): Payer: Self-pay | Admitting: *Deleted

## 2014-08-15 DIAGNOSIS — E669 Obesity, unspecified: Secondary | ICD-10-CM | POA: Diagnosis present

## 2014-08-15 DIAGNOSIS — F1290 Cannabis use, unspecified, uncomplicated: Secondary | ICD-10-CM | POA: Diagnosis present

## 2014-08-15 DIAGNOSIS — F17210 Nicotine dependence, cigarettes, uncomplicated: Secondary | ICD-10-CM | POA: Diagnosis present

## 2014-08-15 DIAGNOSIS — Z8249 Family history of ischemic heart disease and other diseases of the circulatory system: Secondary | ICD-10-CM | POA: Diagnosis not present

## 2014-08-15 DIAGNOSIS — F419 Anxiety disorder, unspecified: Secondary | ICD-10-CM | POA: Diagnosis present

## 2014-08-15 DIAGNOSIS — Z6835 Body mass index (BMI) 35.0-35.9, adult: Secondary | ICD-10-CM | POA: Diagnosis not present

## 2014-08-15 DIAGNOSIS — F322 Major depressive disorder, single episode, severe without psychotic features: Secondary | ICD-10-CM

## 2014-08-15 DIAGNOSIS — G4700 Insomnia, unspecified: Secondary | ICD-10-CM | POA: Diagnosis present

## 2014-08-15 DIAGNOSIS — Z809 Family history of malignant neoplasm, unspecified: Secondary | ICD-10-CM | POA: Diagnosis not present

## 2014-08-15 DIAGNOSIS — Z599 Problem related to housing and economic circumstances, unspecified: Secondary | ICD-10-CM | POA: Diagnosis not present

## 2014-08-15 DIAGNOSIS — F329 Major depressive disorder, single episode, unspecified: Secondary | ICD-10-CM | POA: Diagnosis present

## 2014-08-15 MED ORDER — MAGNESIUM HYDROXIDE 400 MG/5ML PO SUSP: 30.0000 mL | Freq: Every day | ORAL | Status: DC | PRN

## 2014-08-15 MED ORDER — TRAZODONE HCL 50 MG PO TABS
50.0000 mg | ORAL_TABLET | Freq: Every evening | ORAL | Status: DC | PRN
  Filled 2014-08-15: qty 1

## 2014-08-15 MED ORDER — ALUM & MAG HYDROXIDE-SIMETH 200-200-20 MG/5ML PO SUSP
30.0000 mL | ORAL | Status: DC | PRN
  Administered 2014-08-19: 30 mL via ORAL

## 2014-08-15 MED ORDER — INFLUENZA VAC SPLIT QUAD 0.5 ML IM SUSY
0.5000 mL | PREFILLED_SYRINGE | INTRAMUSCULAR | Status: AC
  Administered 2014-08-16: 0.5 mL via INTRAMUSCULAR
  Filled 2014-08-15: qty 0.5

## 2014-08-15 MED ORDER — ACETAMINOPHEN 325 MG PO TABS
650.0000 mg | ORAL_TABLET | Freq: Four times a day (QID) | ORAL | Status: DC | PRN
  Administered 2014-08-17: 650 mg via ORAL
  Filled 2014-08-15: qty 2

## 2014-08-15 NOTE — Tx Team (Signed)
Initial Interdisciplinary Treatment Plan   PATIENT STRESSORS: Educational concerns Financial difficulties Health problems Legal issue   PROBLEM LIST: Problem List/Patient Goals Date to be addressed Date deferred Reason deferred Estimated date of resolution  Depressionn 08/15/14     Suicidal Ideation 2' Depression                                                 DISCHARGE CRITERIA:  Ability to meet basic life and health needs Adequate post-discharge living arrangements Improved stabilization in mood, thinking, and/or behavior Medical problems require only outpatient monitoring Motivation to continue treatment in a less acute level of care Need for constant or close observation no longer present  PRELIMINARY DISCHARGE PLAN: Attend aftercare/continuing care group Attend PHP/IOP Attend 12-step recovery group Outpatient therapy Participate in family therapy Placement in alternative living arrangements  PATIENT/FAMIILY INVOLVEMENT: This treatment plan has been presented to and reviewed with the patient, Ottilia D Domek, and/or family member, .  The patient and family have been given the opportunity to ask questions and make suggestions.  Rich BraveDuke, Ieshia Hatcher Lynn 08/15/2014, 6:46 PM

## 2014-08-15 NOTE — Progress Notes (Signed)
This is the first admission for this very tearful and stressed out 23 yo AA  single mother of 3 ( she has a 25yo, 3yo and a 7yo) and she lives with her mother. She says she and her mother fight " all the time", she says she grew up in group homes, that she didn't have a father , she denies active alcohol problems but says she did cocaine because " it was there". Rose Holden says she had one prior hospitalization with suicide attempt, when she was 23 yo, and says this time, she took 20 advil pm's " because I didn't know what else  to do". She denies known food and / or drug allergies, she denies  regular medications, she shares her only support is her mother, with whom she is dependent on to help watch her children and that her mother constantly critisizes her. She shares she IS afraid of ALL dogs, and consequently does not want to be involved with animal assisted therapy program. She contracts for safety, is oriented to unit an taken to her to her room.

## 2014-08-15 NOTE — ED Notes (Signed)
Called again in attempt to give report on patient, was informed that the nurse "would try to call me back in 5 minutes".

## 2014-08-15 NOTE — ED Notes (Signed)
Patient allowed to place a phone call.

## 2014-08-15 NOTE — ED Provider Notes (Signed)
2:55 PM patient alert ambulatory Glasgow Coma Score 15 pleasant and cooperative Results for orders placed during the hospital encounter of 08/14/14  CBC WITH DIFFERENTIAL      Result Value Ref Range   WBC 6.4  4.0 - 10.5 K/uL   RBC 4.16  3.87 - 5.11 MIL/uL   Hemoglobin 12.5  12.0 - 15.0 g/dL   HCT 16.1  09.6 - 04.5 %   MCV 88.7  78.0 - 100.0 fL   MCH 30.0  26.0 - 34.0 pg   MCHC 33.9  30.0 - 36.0 g/dL   RDW 40.9  81.1 - 91.4 %   Platelets 353  150 - 400 K/uL   Neutrophils Relative % 50  43 - 77 %   Neutro Abs 3.2  1.7 - 7.7 K/uL   Lymphocytes Relative 38  12 - 46 %   Lymphs Abs 2.4  0.7 - 4.0 K/uL   Monocytes Relative 4  3 - 12 %   Monocytes Absolute 0.3  0.1 - 1.0 K/uL   Eosinophils Relative 8 (*) 0 - 5 %   Eosinophils Absolute 0.5  0.0 - 0.7 K/uL   Basophils Relative 0  0 - 1 %   Basophils Absolute 0.0  0.0 - 0.1 K/uL  COMPREHENSIVE METABOLIC PANEL      Result Value Ref Range   Sodium 138  137 - 147 mEq/L   Potassium 3.9  3.7 - 5.3 mEq/L   Chloride 102  96 - 112 mEq/L   CO2 24  19 - 32 mEq/L   Glucose, Bld 99  70 - 99 mg/dL   BUN 17  6 - 23 mg/dL   Creatinine, Ser 7.82  0.50 - 1.10 mg/dL   Calcium 9.4  8.4 - 95.6 mg/dL   Total Protein 7.4  6.0 - 8.3 g/dL   Albumin 4.1  3.5 - 5.2 g/dL   AST 18  0 - 37 U/L   ALT 12  0 - 35 U/L   Alkaline Phosphatase 53  39 - 117 U/L   Total Bilirubin <0.2 (*) 0.3 - 1.2 mg/dL   GFR calc non Af Amer 82 (*) >90 mL/min   GFR calc Af Amer >90  >90 mL/min   Anion gap 12  5 - 15  ETHANOL      Result Value Ref Range   Alcohol, Ethyl (B) <11  0 - 11 mg/dL  PROTIME-INR      Result Value Ref Range   Prothrombin Time 12.6  11.6 - 15.2 seconds   INR 0.94  0.00 - 1.49  ACETAMINOPHEN LEVEL      Result Value Ref Range   Acetaminophen (Tylenol), Serum <15.0  10 - 30 ug/mL  URINALYSIS, ROUTINE W REFLEX MICROSCOPIC      Result Value Ref Range   Color, Urine STRAW (*) YELLOW   APPearance CLEAR  CLEAR   Specific Gravity, Urine <1.005 (*) 1.005 -  1.030   pH 6.0  5.0 - 8.0   Glucose, UA NEGATIVE  NEGATIVE mg/dL   Hgb urine dipstick NEGATIVE  NEGATIVE   Bilirubin Urine NEGATIVE  NEGATIVE   Ketones, ur NEGATIVE  NEGATIVE mg/dL   Protein, ur NEGATIVE  NEGATIVE mg/dL   Urobilinogen, UA 0.2  0.0 - 1.0 mg/dL   Nitrite NEGATIVE  NEGATIVE   Leukocytes, UA NEGATIVE  NEGATIVE  PREGNANCY, URINE      Result Value Ref Range   Preg Test, Ur NEGATIVE  NEGATIVE  URINE RAPID DRUG SCREEN (HOSP  PERFORMED)      Result Value Ref Range   Opiates NONE DETECTED  NONE DETECTED   Cocaine POSITIVE (*) NONE DETECTED   Benzodiazepines NONE DETECTED  NONE DETECTED   Amphetamines NONE DETECTED  NONE DETECTED   Tetrahydrocannabinol NONE DETECTED  NONE DETECTED   Barbiturates NONE DETECTED  NONE DETECTED  SALICYLATE LEVEL      Result Value Ref Range   Salicylate Lvl <2.0 (*) 2.8 - 20.0 mg/dL   No results found. Stable for transfer to psychiatric facility  Doug SouSam Keyshawna Prouse, MD 08/15/14 1500

## 2014-08-15 NOTE — Progress Notes (Signed)
[facility] Group Notes:  (Nursing/MHT/Case Management/Adjunct)  Date:  08/15/2014  Time:  10:52 PM  Type of Therapy:  Group Therapy  Participation Level:  Did Not Attend  Participation Quality:  Did Not Attend  Affect:  Did Not Attend  Cognitive:  Did Not Attend  Insight:  None  Engagement in Group:  Did Not Attend  Modes of Intervention:  Socialization and Support  Summary of Progress/Problems: Pt. Was sleeping in bed.  Sondra ComeWilson, Lawyer Washabaugh J 08/15/2014, 10:52 PM

## 2014-08-15 NOTE — ED Notes (Signed)
[facility]. Accepting MD [place].

## 2014-08-15 NOTE — ED Notes (Signed)
[facility], per sec, all the phones "which the nurses use are being used by the patients", instructed to call back.

## 2014-08-16 DIAGNOSIS — T39312D Poisoning by propionic acid derivatives, intentional self-harm, subsequent encounter: Secondary | ICD-10-CM

## 2014-08-16 DIAGNOSIS — F329 Major depressive disorder, single episode, unspecified: Secondary | ICD-10-CM

## 2014-08-16 MED ORDER — TRAZODONE HCL 50 MG PO TABS
50.0000 mg | ORAL_TABLET | Freq: Every day | ORAL | Status: DC
  Administered 2014-08-16: 50 mg via ORAL
  Filled 2014-08-16 (×3): qty 1

## 2014-08-16 MED ORDER — FLUOXETINE HCL 20 MG PO CAPS
20.0000 mg | ORAL_CAPSULE | Freq: Every day | ORAL | Status: DC
  Administered 2014-08-16 – 2014-08-19 (×4): 20 mg via ORAL
  Filled 2014-08-16 (×7): qty 1
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
## 2014-08-16 NOTE — Progress Notes (Signed)
D: Pt presents with flat affect, depressed and minimal interaction on the milieu. Pt rates depression 5/10. Pt would not disclose to Clinical research associatewriter any stressors. Pt appears to be minimizing her problems and symptoms at this time. Pt denies SI/HI/AVH. Pt did not attend morning goals group.  A: Medication orders reviewed by Clinical research associatewriter. Verbal support given. Pt encouraged to attend groups. Pt encouraged to express emotions and symptoms to staff. 15 minute checks performed for safety.  R: Pt has poor insight for treatment. Pt safety maintained at this time.

## 2014-08-16 NOTE — Progress Notes (Signed)
D: Pt denies SI/HI/AVH. Pt is pleasant and cooperative. Pt stayed in room most of the night.   A: Pt was offered support and encouragement. Pt was given scheduled medications. Pt was encourage to attend groups. Q 15 minute checks were done for safety.   R: Pt is taking medication. Pt has no complaints.Pt receptive to treatment and safety maintained on unit.

## 2014-08-16 NOTE — [facility] Group Notes (Signed)
[facility] Group Notes:  (Nursing/MHT/Case Management/Adjunct)  Date:  08/16/2014  Time:  11:57 AM  Type of Therapy:  Psychoeducational Skills  Participation Level:  Active  Participation Quality:  Appropriate  Affect:  Appropriate  Cognitive:  Appropriate  Insight:  Appropriate  Engagement in Group:  Engaged  Modes of Intervention:  Discussion  Summary of Progress/Problems: Pt did attend self inventory group, pt reported that she was negative SI/HI, no AH/VH noted. Pt rated her depression as a 4, and her helplessness/hopelessness as a 0.     Pt reported no issues or concerns.   Jacquelyne BalintForrest, Mayur Duman Shanta 08/16/2014, 11:57 AM

## 2014-08-16 NOTE — [facility] Group Notes (Signed)
[facility] Group Notes:  (Clinical Social Work)  08/16/2014     10-11AM  Summary of Progress/Problems:   The main focus of today's process group was to learn how to use a decisional balance exercise to move forward in the Stages of Change, which were described and discussed.  Motivational Interviewing and a worksheet were utilized to help patients explore in depth the perceived benefits and costs of a self-sabotaging behavior, as well as the  benefits and costs of replacing that with a healthy coping mechanism.   The patient expressed that she is in the hospital for suicidal ideation.  She was sitting in a corner, and even when clinician tried to move in order to be able to see her more clearly, she kept leaning far into the corner and avoided eye contact.  After about 20 minutes in group, she left and did not return.  Type of Therapy:  Group Therapy - Process   Participation Level:  Minimal  Participation Quality:  Resistant  Affect:  Flat  Cognitive:  Alert  Insight:  Limited  Engagement in Therapy:  Poor  Modes of Intervention:  Education, Motivational Interviewing  Ambrose MantleMareida Grossman-Orr, LCSW 08/16/2014, 12:43 PM

## 2014-08-16 NOTE — Progress Notes (Signed)
[facility] Group Notes:  (Nursing/MHT/Case Management/Adjunct)  Date:  08/16/2014  Time:  11:17 PM  Type of Therapy:  AA  Participation Level:  Did Not Attend  Participation Quality:  Did Not Attend  Affect:  Did Not Attend  Cognitive:  Did Not Attend  Insight:  None  Engagement in Group:  Did Not Attend  Modes of Intervention:  Discussion and Education  Summary of Progress/Problems: Pt. Did not Attend AA group.  Sondra ComeWilson, Zhion Pevehouse J 08/16/2014, 11:17 PM

## 2014-08-16 NOTE — H&P (Signed)
Psychiatric Admission Assessment Adult  Patient Identification:  Rose Holden  Date of Evaluation:  08/16/2014  Chief Complaint:  MAJOR DEPRESSIVE DISORDER, SEVERE  History of Present Illness: Rose Holden is 23 years old, African-American female. Admitted from the [facility]. She reports, "The ambulance took me to the hospital 3 days ago. I took some Advil PM pills. I was trying to commit suicide. A lot of things are going on in my life that are not so pleasant. It pertains to my living situation. I live with my mother. She is not good to me. She is always criticizing me, no encouraging words from her to me. I have been depressed for many months as a result. I had a referral to go to the [facility] in [place], [place] for treatment, but have not gotten around it. I had attempted suicide a while ago, I was taken to the [facility]. I need treatment for my depression because I have not been on medicines in a long time".  Elements:  Location:  major depression. Quality:  High anxiety levels, inability to cope, suicidal ideation/attempt. Severity:  Severe. Timing:  Worsening depression within the last few months.. Duration:  Chronic. Context:  "I got very depressed & hopeless because of my life situations, becamse suicidal, overdosed  on pills".  Associated Signs/Synptoms:  Depression Symptoms:  depressed mood, insomnia, feelings of worthlessness/guilt, hopelessness, anxiety, insomnia,  (Hypo) Manic Symptoms:  Impulsivity,  Anxiety Symptoms:  Excessive Worry,  Psychotic Symptoms:  Denies  PTSD Symptoms: NA  Total Time spent with patient: 1 hour  Psychiatric Specialty Exam: Physical Exam  Constitutional: She is oriented to person, place, and time. She appears well-developed.  HENT:  Head: Normocephalic.  Eyes: Pupils are equal, round, and reactive to light.  Neck: Normal range of motion.  Cardiovascular: Normal rate.   Respiratory: Effort normal.  GI: Soft.   Genitourinary:  Denies any issues in this area  Musculoskeletal: Normal range of motion.  Neurological: She is alert and oriented to person, place, and time.  Skin: Skin is warm and dry.  Psychiatric: Her speech is normal and behavior is normal. Judgment and thought content normal. Her mood appears anxious. Cognition and memory are normal. She exhibits a depressed mood.    ROS  Blood pressure 102/82, pulse 89, temperature 97.6 F (36.4 C), temperature source Oral, resp. rate 16, height $RemoveBe'5\' 2"'oBiMdwQFL$  (1.575 m), weight 87.544 kg (193 lb), last menstrual period 07/17/2014, SpO2 100.00%, unknown if currently breastfeeding.Body mass index is 35.29 kg/(m^2).  General Appearance: Disheveled and obese  Eye Contact::  Fair  Speech:  Clear and Coherent  Volume:  Normal  Mood:  Anxious and Depressed  Affect:  Flat  Thought Process:  Coherent and Goal Directed  Orientation:  Full (Time, Place, and Person)  Thought Content:  Rumination  Suicidal Thoughts:  No  Homicidal Thoughts:  No  Memory:  Immediate;   Good Recent;   Good Remote;   Good  Judgement:  Fair  Insight:  Present  Psychomotor Activity:  Normal  Concentration:  Fair  Recall:  [employer] of Knowledge:Poor  Language: Fair  Akathisia:  No  Handed:  Right  AIMS (if indicated):     Assets:  Communication Skills Desire for Improvement  Sleep:  Number of Hours: 6    Musculoskeletal: Strength & Muscle Tone: within normal limits Gait & Station: normal Patient leans: N/A  Past Psychiatric History: Diagnosis:  Hospitalizations:  Outpatient Care:  Substance Abuse Care:  Self-Mutilation:  Suicidal Attempts:  Violent Behaviors:   Past Medical History:   Past Medical History  Diagnosis Date  . Medical history non-contributory    None.  Allergies:  No Known Allergies  PTA Medications: No prescriptions prior to admission   Previous Psychotropic Medications:  Medication/Dose  See medication lists                Substance Abuse History in the last 12 months:  Yes.    Consequences of Substance Abuse: Medical Consequences:  Liver damage, Possible death by overdose Legal Consequences:  Arrests, jail time, Loss of driving privilege. Family Consequences:  Family discord, divorce and or separation.  Social History:  reports that she has been smoking Cigarettes.  She has been smoking about 0.50 packs per day. She has never used smokeless tobacco. She reports that she drinks alcohol. She reports that she uses illicit drugs (Marijuana). Additional Social History: Pain Medications: n0one History of alcohol / drug use?: Yes Longest period of sobriety (when/how long): cant remember Negative Consequences of Use: Financial;Legal;Personal relationships;Work / School Withdrawal Symptoms: Agitation;Aggressive/Assaultive;Anorexia Name of Substance 1: cocaine Current Place of Residence: [place], [place]   Place of Birth: [place], [place]     Family Members: "My children"  Marital Status:  Single  Children:  Sons:  Daughters:  Relationships:  Education:  Completed 7 th grade  Educational Problems/Performance: Did not comple high school  Religious Beliefs/Practices: NA  History of Abuse (Emotional/Phsycial/Sexual): Denies  Occupational Experiences: Medical laboratory scientific officer History:  None.  Legal History: Denies any pending legal charges  Hobbies/Interests: NA  Family History:   Family History  Problem Relation Age of Onset  . Hypertension Mother   . Cancer Maternal Grandmother     Results for orders placed during the hospital encounter of 08/14/14 (from the past 72 hour(s))  URINALYSIS, ROUTINE W REFLEX MICROSCOPIC     Status: Abnormal   Collection Time    08/14/14  2:10 AM      Result Value Ref Range   Color, Urine STRAW (*) YELLOW   APPearance CLEAR  CLEAR   Specific Gravity, Urine <1.005 (*) 1.005 - 1.030   pH 6.0  5.0 - 8.0   Glucose, UA NEGATIVE  NEGATIVE mg/dL   Hgb urine dipstick  NEGATIVE  NEGATIVE   Bilirubin Urine NEGATIVE  NEGATIVE   Ketones, ur NEGATIVE  NEGATIVE mg/dL   Protein, ur NEGATIVE  NEGATIVE mg/dL   Urobilinogen, UA 0.2  0.0 - 1.0 mg/dL   Nitrite NEGATIVE  NEGATIVE   Leukocytes, UA NEGATIVE  NEGATIVE   Comment: MICROSCOPIC NOT DONE ON URINES WITH NEGATIVE PROTEIN, BLOOD, LEUKOCYTES, NITRITE, OR GLUCOSE <1000 mg/dL.  PREGNANCY, URINE     Status: None   Collection Time    08/14/14  2:10 AM      Result Value Ref Range   Preg Test, Ur NEGATIVE  NEGATIVE   Comment:            THE SENSITIVITY OF THIS     METHODOLOGY IS >20 mIU/mL.  URINE RAPID DRUG SCREEN (HOSP PERFORMED)     Status: Abnormal   Collection Time    08/14/14  2:10 AM      Result Value Ref Range   Opiates NONE DETECTED  NONE DETECTED   Cocaine POSITIVE (*) NONE DETECTED   Benzodiazepines NONE DETECTED  NONE DETECTED   Amphetamines NONE DETECTED  NONE DETECTED   Tetrahydrocannabinol NONE DETECTED  NONE DETECTED   Barbiturates NONE DETECTED  NONE DETECTED  Comment:            DRUG SCREEN FOR MEDICAL PURPOSES     ONLY.  IF CONFIRMATION IS NEEDED     FOR ANY PURPOSE, NOTIFY LAB     WITHIN 5 DAYS.                LOWEST DETECTABLE LIMITS     FOR URINE DRUG SCREEN     Drug Class       Cutoff (ng/mL)     Amphetamine      1000     Barbiturate      200     Benzodiazepine   330     Tricyclics       076     Opiates          300     Cocaine          300     THC              50  CBC WITH DIFFERENTIAL     Status: Abnormal   Collection Time    08/14/14  2:15 AM      Result Value Ref Range   WBC 6.4  4.0 - 10.5 K/uL   RBC 4.16  3.87 - 5.11 MIL/uL   Hemoglobin 12.5  12.0 - 15.0 g/dL   HCT 36.9  36.0 - 46.0 %   MCV 88.7  78.0 - 100.0 fL   MCH 30.0  26.0 - 34.0 pg   MCHC 33.9  30.0 - 36.0 g/dL   RDW 13.1  11.5 - 15.5 %   Platelets 353  150 - 400 K/uL   Neutrophils Relative % 50  43 - 77 %   Neutro Abs 3.2  1.7 - 7.7 K/uL   Lymphocytes Relative 38  12 - 46 %   Lymphs Abs 2.4  0.7 -  4.0 K/uL   Monocytes Relative 4  3 - 12 %   Monocytes Absolute 0.3  0.1 - 1.0 K/uL   Eosinophils Relative 8 (*) 0 - 5 %   Eosinophils Absolute 0.5  0.0 - 0.7 K/uL   Basophils Relative 0  0 - 1 %   Basophils Absolute 0.0  0.0 - 0.1 K/uL  COMPREHENSIVE METABOLIC PANEL     Status: Abnormal   Collection Time    08/14/14  2:15 AM      Result Value Ref Range   Sodium 138  137 - 147 mEq/L   Potassium 3.9  3.7 - 5.3 mEq/L   Chloride 102  96 - 112 mEq/L   CO2 24  19 - 32 mEq/L   Glucose, Bld 99  70 - 99 mg/dL   BUN 17  6 - 23 mg/dL   Creatinine, Ser 0.96  0.50 - 1.10 mg/dL   Calcium 9.4  8.4 - 10.5 mg/dL   Total Protein 7.4  6.0 - 8.3 g/dL   Albumin 4.1  3.5 - 5.2 g/dL   AST 18  0 - 37 U/L   ALT 12  0 - 35 U/L   Alkaline Phosphatase 53  39 - 117 U/L   Total Bilirubin <0.2 (*) 0.3 - 1.2 mg/dL   GFR calc non Af Amer 82 (*) >90 mL/min   GFR calc Af Amer >90  >90 mL/min   Comment: (NOTE)     The eGFR has been calculated using the CKD EPI equation.     This calculation has not been validated in all clinical  situations.     eGFR's persistently <90 mL/min signify possible Chronic Kidney     Disease.   Anion gap 12  5 - 15  ETHANOL     Status: None   Collection Time    08/14/14  2:15 AM      Result Value Ref Range   Alcohol, Ethyl (B) <11  0 - 11 mg/dL   Comment:            LOWEST DETECTABLE LIMIT FOR     SERUM ALCOHOL IS 11 mg/dL     FOR MEDICAL PURPOSES ONLY  PROTIME-INR     Status: None   Collection Time    08/14/14  2:15 AM      Result Value Ref Range   Prothrombin Time 12.6  11.6 - 15.2 seconds   INR 0.94  0.00 - 1.49  ACETAMINOPHEN LEVEL     Status: None   Collection Time    08/14/14  2:15 AM      Result Value Ref Range   Acetaminophen (Tylenol), Serum <15.0  10 - 30 ug/mL   Comment:            THERAPEUTIC CONCENTRATIONS VARY     SIGNIFICANTLY. A RANGE OF 10-30     ug/mL MAY BE AN EFFECTIVE     CONCENTRATION FOR MANY PATIENTS.     HOWEVER, SOME ARE BEST TREATED     AT  CONCENTRATIONS OUTSIDE THIS     RANGE.     ACETAMINOPHEN CONCENTRATIONS     >150 ug/mL AT 4 HOURS AFTER     INGESTION AND >50 ug/mL AT 12     HOURS AFTER INGESTION ARE     OFTEN ASSOCIATED WITH TOXIC     REACTIONS.  SALICYLATE LEVEL     Status: Abnormal   Collection Time    08/14/14  2:15 AM      Result Value Ref Range   Salicylate Lvl <2.0 (*) 2.8 - 20.0 mg/dL   Psychological Evaluations:  Assessment:   DSM5: Schizophrenia Disorders:  NA Obsessive-Compulsive Disorders:  NA Trauma-Stressor Disorders:  NA Substance/Addictive Disorders:  NA Depressive Disorders:  MDD (major depressive disorder), single episode  AXIS I:  MDD (major depressive disorder), single episode AXIS II:  Deferred AXIS III:   Past Medical History  Diagnosis Date  . Medical history non-contributory    AXIS IV:  economic problems, housing problems and occupational problems AXIS V:  11-20 some danger of hurting self or others possible OR occasionally fails to maintain minimal personal hygiene OR gross impairment in communication  Treatment Plan/Recommendations: 1. Admit for crisis management and stabilization, estimated length of stay 3-5 days.  2. Medication management to reduce current symptoms to base line and improve the patient's overall level of functioning; continue current treatment already in progress.  3. Treat health problems as indicated.  4. Develop treatment plan to decrease risk of relapse upon discharge and the need for readmission.  5. Psycho-social education regarding relapse prevention and self care.  6. Health care follow up as needed for medical problems.  7. Review, reconcile, and reinstate any pertinent home medications for other health issues where appropriate. 8. Call for consults with hospitalist for any additional specialty patient care services as needed.  Treatment Plan Summary: Daily contact with patient to assess and evaluate symptoms and progress in treatment Medication  management  Current Medications:  Current Facility-Administered Medications  Medication Dose Route Frequency Provider Last Rate Last Dose  . acetaminophen (TYLENOL) tablet 650  mg  650 mg Oral Q6H PRN Nicholaus Bloom, MD      . alum & mag hydroxide-simeth (MAALOX/MYLANTA) 200-200-20 MG/5ML suspension 30 mL  30 mL Oral Q4H PRN Nicholaus Bloom, MD      . FLUoxetine (PROZAC) capsule 20 mg  20 mg Oral Daily Celise Bazar   20 mg at 08/16/14 1042  . magnesium hydroxide (MILK OF MAGNESIA) suspension 30 mL  30 mL Oral Daily PRN Nicholaus Bloom, MD      . traZODone (DESYREL) tablet 50 mg  50 mg Oral QHS Grettel Rames        Observation Level/Precautions:  15 minute checks  Laboratory:  Per ED  Psychotherapy:  Group sessions  Medications:  See medication lists  Consultations:  As needed  Discharge Concerns:  Mood stability  Estimated LOS: 3-5 days  Other:     I certify that inpatient services furnished can reasonably be expected to improve the patient's condition.   Lindell Spar I, PMHNP-BC 10/3/201511:02 AM  Patient seen, evaluated and I agree with notes by Nurse Practitioner. Corena Pilgrim, MD

## 2014-08-16 NOTE — [facility] Group Notes (Signed)
[facility] Group Notes:  (Nursing/MHT/Case Management/Adjunct)  Date:  08/16/2014  Time:  4:18 PM  Type of Therapy:  Psychoeducational Skills- Healthy Coping Skills  Participation Level: Did not attend.   Jacquelyne BalintForrest, Benjamin Merrihew Shanta 08/16/2014, 4:18 PM

## 2014-08-17 MED ORDER — QUETIAPINE FUMARATE 100 MG PO TABS
100.0000 mg | ORAL_TABLET | Freq: Every day | ORAL | Status: DC
  Administered 2014-08-17 – 2014-08-18 (×2): 100 mg via ORAL
  Filled 2014-08-17 (×4): qty 1

## 2014-08-17 NOTE — [facility] Group Notes (Signed)
[facility] Group Notes:  (Clinical Social Work)  08/17/2014  10:00-11:00AM  Summary of Progress/Problems:   The main focus of today's process group was to   1)  discuss the importance of adding supports  2)  define health supports versus unhealthy supports  3)  identify the patient's current unhealthy supports and plan how to handle them  4)  Identify the patient's current healthy supports and plan what to add.  An emphasis was placed on using counselor, doctor, therapy groups, 12-step groups, and problem-specific support groups to expand supports.    The patient remained in group only for the first 10 minutes, stating her mother is her main current support.  She left the room shortly.   Type of Therapy:  Process Group with Motivational Interviewing  Participation Level:  Minimal  Participation Quality:  Resistant  Affect:  Flat  Cognitive:  Alert  Insight:  Limited  Engagement in Therapy:  Limited  Modes of Intervention:   Education, Support and Processing, Activity  Pilgrim's PrideMareida Grossman-Orr, LCSW 08/17/2014, 12:15pm

## 2014-08-17 NOTE — Progress Notes (Signed)
[facility]  08/17/2014 1:12 PM Rose Holden  MRN:  [id number]  Subjective:  Rose Holden says her medicine has not started working because she is still feeling very depressed. she is also endorsing insomnia. Says when she took Seroquel several years ago, it helped both her mood and sleep pattern. She denies any SIHI.  O: Rose Holden is seen and chart reviewed. Patient continues to endorse ongoing depressive symptoms, anxiety anddifficulty sleeping. she is rating her  depression at #7 today. However, she is denying suicidal thoughts. Patient is requesting for her medications to be adjusted to address her current symptoms. She is compliant with his current medications and has not verbalized any adverse reactions.  Diagnosis:   DSM5: Schizophrenia Disorders:  NA Obsessive-Compulsive Disorders:  NA Trauma-Stressor Disorders:  NA Substance/Addictive Disorders:  NA Depressive Disorders:  MDD (major depressive disorder), single episode  Total Time spent with patient: 35 minutes  Axis I: MDD (major depressive disorder), single episode Axis II: Deferred Axis III:  Past Medical History  Diagnosis Date  . Medical history non-contributory    Axis IV: economic problems, housing problems and other psychosocial or environmental problems Axis V: 41-50 serious symptoms  ADL's:  Impaired  Sleep: Fair  Appetite:  Good  Suicidal Ideation:  Plan:  Denies Intent:  denies Means:  Denies Homicidal Ideation:  Plan:  Denies Intent:  denies Means:  Denies AEB (as evidenced by):  Psychiatric Specialty Exam: Physical Exam  Review of Systems  Constitutional: Negative.   HENT: Negative.   Eyes: Negative.   Respiratory: Negative.   Cardiovascular: Negative.   Gastrointestinal: Negative.   Genitourinary: Negative.   Musculoskeletal: Negative.   Skin: Negative.   Neurological: Negative.   Endo/Heme/Allergies: Negative.   Psychiatric/Behavioral: Positive for depression. Negative for suicidal  ideas, hallucinations, memory loss and substance abuse. The patient is nervous/anxious and has insomnia.     Blood pressure 110/63, pulse 85, temperature 98.7 F (37.1 C), temperature source Oral, resp. rate 18, height 5\' 2"  (1.575 m), weight 87.544 kg (193 lb), last menstrual period 07/17/2014, SpO2 100.00%, unknown if currently breastfeeding.Body mass index is 35.29 kg/(m^2).  General Appearance: Disheveled  Eye SolicitorContact::  Fair  Speech:  Clear and Coherent  Volume:  Normal  Mood:  Depressed  Affect:  Flat  Thought Process:  Coherent and Goal Directed  Orientation:  Full (Time, Place, and Person)  Thought Content:  Rumination  Suicidal Thoughts:  No  Homicidal Thoughts:  No  Memory:  Immediate;   Good Recent;   Good Remote;   Good  Judgement:  Fair  Insight:  Present  Psychomotor Activity:  Normal  Concentration:  Fair  Recall:  Good  Fund of Knowledge:Poor  Language: Fair  Akathisia:  No  Handed:  Right  AIMS (if indicated):     Assets:  Desire for Improvement  Sleep:  Number of Hours: 5   Musculoskeletal: Strength & Muscle Tone: within normal limits Gait & Station: normal Patient leans: N/A  Current Medications: Current Facility-Administered Medications  Medication Dose Route Frequency Provider Last Rate Last Dose  . acetaminophen (TYLENOL) tablet 650 mg  650 mg Oral Q6H PRN Rachael FeeIrving A Lugo, MD   650 mg at 08/17/14 1205  . alum & mag hydroxide-simeth (MAALOX/MYLANTA) 200-200-20 MG/5ML suspension 30 mL  30 mL Oral Q4H PRN Rachael FeeIrving A Lugo, MD      . FLUoxetine (PROZAC) capsule 20 mg  20 mg Oral Daily Cynthie Garmon   20 mg at 08/17/14 1205  .  magnesium hydroxide (MILK OF MAGNESIA) suspension 30 mL  30 mL Oral Daily PRN Rachael Fee, MD      . QUEtiapine (SEROQUEL) tablet 100 mg  100 mg Oral QHS Sanjuana Kava, NP        Lab Results: No results found for this or any previous visit (from the past 48 hour(s)).  Physical Findings: AIMS: Facial and Oral Movements Muscles of  Facial Expression: None, normal Lips and Perioral Area: None, normal Jaw: None, normal Tongue: None, normal,Extremity Movements Upper (arms, wrists, hands, fingers): None, normal Lower (legs, knees, ankles, toes): None, normal, Trunk Movements Neck, shoulders, hips: None, normal, Overall Severity Severity of abnormal movements (highest score from questions above): None, normal Incapacitation due to abnormal movements: None, normal Patient's awareness of abnormal movements (rate only patient's report): No Awareness, Dental Status Current problems with teeth and/or dentures?: No Does patient usually wear dentures?: No  CIWA:    COWS:  COWS Total Score: 7  Treatment Plan Summary: Daily contact with patient to assess and evaluate symptoms and progress in treatment Medication management  Plan: 1. Continue crisis management, mood stabilization & relapse prevention.. 2. Continue current medication management to reduce current symptoms to base line and improve the  patient's overall level of functioning; Fluoxetine 20 mg for depression, initiate Seroquel 100 mg Q bedtime for mood control. 3. Treat health problems as indicated. 4. Develop treatment plan to enhance medication adeherance upon discharge and the need for  readmission. 5. Psycho-social education regarding relapse prevention and self care. Medical Decision Making Problem Points:  Established problem, stable/improving (1), Review of last therapy session (1) and Review of psycho-social stressors (1) Data Points:  Review of medication regiment & side effects (2) Review of new medications or change in dosage (2)  I certify that inpatient services furnished can reasonably be expected to improve the patient's condition.   Armandina Stammer I, PMHNP-BC 08/17/2014, 1:12 PM  Patient seen, evaluated and I agree with notes by Nurse Practitioner. Thedore Mins, MD

## 2014-08-17 NOTE — Progress Notes (Signed)
Patient ID: Rose Holden, female   DOB: 07/11/1991, 23 y.o.   MRN: [id number]   D: Pt continues to be very flat and depressed on the unit today. Pt has not attended any groups and has remained in the bed most of the day. Pt takes all medication without any problems, no issues or concerns noted.  Pt reported being negative SI/HI, no AH/VH noted. A: 15 min checks continued for patient safety. R: Pt safety maintained.

## 2014-08-17 NOTE — Progress Notes (Signed)
Patient did attend the evening speaker AA meeting.  

## 2014-08-17 NOTE — [facility] Group Notes (Signed)
[facility] Group Notes:  (Nursing/MHT/Case Management/Adjunct)  Date:  08/17/2014  Time:  5:48 PM  Type of Therapy:  Nurse Education  Participation Level:  Active  Participation Quality:  Appropriate, Sharing and Supportive  Affect:  Flat  Cognitive:  Alert  Insight:  Limited  Engagement in Group:  Engaged and Supportive  Modes of Intervention:  Activity, Discussion and Education  Summary of Progress/Problems: pt feels all her needs were met today and her aunt and therapist or her support.   ,  Gail 08/17/2014, 5:48 PM 

## 2014-08-17 NOTE — [facility] Group Notes (Signed)
[facility] Group Notes:  (Nursing/MHT/Case Management/Adjunct)  Date:  08/17/2014  Time:  12:24 PM  Type of Therapy:  Psychoeducational Skills-Pt Self Inventory Group   Participation Level:  Did Not Attend  Rose Holden Rose Holden 08/17/2014, 12:24 PM 

## 2014-08-18 MED ORDER — PNEUMOCOCCAL VAC POLYVALENT 25 MCG/0.5ML IJ INJ: 0.5000 mL | INJECTION | INTRAMUSCULAR | Status: DC

## 2014-08-18 NOTE — [facility] Suicide Risk Assessment (Signed)
[facility] INPATIENT:  Family/Significant Other Suicide Prevention Education  Suicide Prevention Education:  Education Completed; Rose Holden (pt's mother) [phone] has been identified by the patient as the family member/significant other with whom the patient will be residing, and identified as the person(s) who will aid the patient in the event of a mental health crisis (suicidal ideations/suicide attempt).  With written consent from the patient, the family member/significant other has been provided the following suicide prevention education, prior to the and/or following the discharge of the patient.  The suicide prevention education provided includes the following:  Suicide risk factors  Suicide prevention and interventions  National Suicide Hotline telephone number  [facility] assessment telephone number  [facility] Emergency Assistance 911  [facility] and/or [facility] telephone number  Request made of family/significant other to:  Remove weapons (e.g., guns, rifles, knives), all items previously/currently identified as safety concern.    Remove drugs/medications (over-the-counter, prescriptions, illicit drugs), all items previously/currently identified as a safety concern.  The family member/significant other verbalizes understanding of the suicide prevention education information provided.  The family member/significant other agrees to remove the items of safety concern listed above.  Smart, Huntley Knoop LCSWA  08/18/2014, 3:43 PM

## 2014-08-18 NOTE — [facility] Group Notes (Signed)
.  [facility] LCSW Group Therapy 08/18/2014  1:15 pm  Type of Therapy: Group Therapy Participation Level: Active  Participation Quality: Attentive and Supportive  Affect: Depressed and Flat  Cognitive: Alert and Oriented  Insight: Developing/Improving and Engaged  Engagement in Therapy: Developing/Improving and Engaged  Modes of Intervention: Clarification, Confrontation, Discussion, Education, Exploration,  Limit-setting, Orientation, Problem-solving, Rapport Building, Dance movement psychotherapisteality Testing, Socialization and Support  Summary of Progress/Problems: Pt identified obstacles faced currently and processed barriers involved in overcoming these obstacles. Pt identified steps necessary for overcoming these obstacles and explored motivation (internal and external) for facing these difficulties head on. Pt further identified one area of concern in their lives and chose a goal to focus on for today. Patient actively listened during group but did not share on topic today. Patient reported that she could not identify a obstacle or challenge.  Samuella BruinKristin Grisel Blumenstock, MSW, [employer]LCSWA Clinical Social Worker [facility] [phone]

## 2014-08-18 NOTE — Progress Notes (Signed)
Patient ID: Rose Holden, female   DOB: 05/12/1991, 23 y.o.   MRN: [id number] [facility] MD Progress Note  08/18/2014 11:35 AM Rose Holden  MRN:  [id number]  Subjective: Patient reports she is feeling better, and at this time no longer feels depressed. She states she is wanting to discharge soon because " I miss my kids, and I'll be happier at home".  Objective : She presents slightly irritable/guarded  at first,  but this quickly improved as session progressed. She states her mood is better and today no longer feels depressed. She is denying any self injurious or suicidal thoughts. She is wanting to discharge soon. Her group participation has been limited. We discussed this , and she states that she feels groups " are not for me because they are about drug abuse and alcoholism, which I'm not". She does endorse recent cocaine use x 1 and UDS is positive for cocaine, but denies any pattern of abuse or dependence.  She is tolerating medications well and feels they are helping. No akatisia or abnormal involuntary movements noted.    Diagnosis:     MDD (major depressive disorder), single episode  Total Time spent with patient: 25 minutes     ADL's: improving   Sleep: improving   Appetite:  Good  Suicidal Ideation:  Denies any suicidal or homicidal ideations Homicidal Ideation:  Denies  AEB (as evidenced by):  Psychiatric Specialty Exam: Physical Exam  Review of Systems  Constitutional: Negative.   HENT: Negative.   Eyes: Negative.   Respiratory: Negative.   Cardiovascular: Negative.   Gastrointestinal: Negative.   Genitourinary: Negative.   Musculoskeletal: Negative.   Skin: Negative.   Neurological: Negative.   Endo/Heme/Allergies: Negative.   Psychiatric/Behavioral: Positive for depression. Negative for suicidal ideas, hallucinations, memory loss and substance abuse. The patient is nervous/anxious and has insomnia.     Blood pressure 110/54, pulse 94, temperature 98.5 F (36.9  C), temperature source Oral, resp. rate 20, height 5\' 2"  (1.575 m), weight 87.544 kg (193 lb), last menstrual period 07/17/2014, SpO2 100.00%, unknown if currently breastfeeding.Body mass index is 35.29 kg/(m^2).  General Appearance: Fairly Groomed  Patent attorney::  Good  Speech:  Clear and Coherent  Volume:  Normal  Mood:  at this time patient denies depression and states she feels "OK"  Affect:  appropriate, and reactive   Thought Process:  Coherent and Goal Directed  Orientation:  Full (Time, Place, and Person)  Thought Content:  denies hallucinations, no delusions expressed   Suicidal Thoughts:  No- currently denies any self injurious thoughts or any suicidal ideations  Homicidal Thoughts:  No  Memory:  Recent and remote grossly intact   Judgement:  Fair  Insight:  Fair  Psychomotor Activity:  Normal  Concentration:  Good  Recall:  Good  Fund of Knowledge:Good  Language: Good  Akathisia:  No  Handed:  Right  AIMS (if indicated):     Assets:  Desire for Improvement  Sleep:  Number of Hours: 6   Musculoskeletal: Strength & Muscle Tone: within normal limits Gait & Station: normal Patient leans: N/A  Current Medications: Current Facility-Administered Medications  Medication Dose Route Frequency Provider Last Rate Last Dose  . acetaminophen (TYLENOL) tablet 650 mg  650 mg Oral Q6H PRN Rachael Fee, MD   650 mg at 08/17/14 1205  . alum & mag hydroxide-simeth (MAALOX/MYLANTA) 200-200-20 MG/5ML suspension 30 mL  30 mL Oral Q4H PRN Rachael Fee, MD      .  FLUoxetine (PROZAC) capsule 20 mg  20 mg Oral Daily Mojeed Akintayo   20 mg at 08/18/14 0832  . magnesium hydroxide (MILK OF MAGNESIA) suspension 30 mL  30 mL Oral Daily PRN Rachael FeeIrving A Lugo, MD      . Rose Holden[START ON 08/19/2014] pneumococcal 23 valent vaccine (PNU-IMMUNE) injection 0.5 mL  0.5 mL Intramuscular Tomorrow-1000 Nehemiah MassedFernando Nhu Glasby, MD      . QUEtiapine (SEROQUEL) tablet 100 mg  100 mg Oral QHS Sanjuana KavaAgnes I Nwoko, NP   100 mg at 08/17/14  2141    Lab Results: No results found for this or any previous visit (from the past 48 hour(s)).  Physical Findings: AIMS: Facial and Oral Movements Muscles of Facial Expression: None, normal Lips and Perioral Area: None, normal Jaw: None, normal Tongue: None, normal,Extremity Movements Upper (arms, wrists, hands, fingers): None, normal Lower (legs, knees, ankles, toes): None, normal, Trunk Movements Neck, shoulders, hips: None, normal, Overall Severity Severity of abnormal movements (highest score from questions above): None, normal Incapacitation due to abnormal movements: None, normal Patient's awareness of abnormal movements (rate only patient's report): No Awareness, Dental Status Current problems with teeth and/or dentures?: No Does patient usually wear dentures?: No  CIWA:    COWS:  COWS Total Score: 7  Assessment:  At this time patient is improved. She states her mood is better, and no longer endorses depression. She is focused on being discharged home soon, and states she is looking forward to reuniting with her children/family. She denies medication  Side effects.  Behavior on unit calm and in good control, but participation in milieu/ groups is limited .   Treatment Plan Summary: Daily contact with patient to assess and evaluate symptoms and progress in treatment Medication management  Plan:  Continue inpatient treatment and support. Fluoxetine 20 mg for depression Seroquel 100 mg Q bedtime for mood control. Treatment team working on disposition plans/options  Medical Decision Making Problem Points:  Established problem, stable/improving (1), Review of last therapy session (1) and Review of psycho-social stressors (1) Data Points:  Review of medication regiment & side effects (2)  I certify that inpatient services furnished can reasonably be expected to improve the patient's condition.   Nehemiah MassedOBOS, Kato Wieczorek, MD  08/18/2014, 11:35 AM

## 2014-08-18 NOTE — Progress Notes (Signed)
Patient ID: Rose Holden, female   DOB: 02/15/1991, 23 y.o.   MRN: [id number] D- Patient reports good sleep last night.She reports her appetite is good and her energy level is normal.  Her concentration is good.  She was not in group when approached, was sleeping in bed.  A- Talked with patient about going to group and her goal of going home.  Patient says she feels her depression has improved and that this is  the main change she sees in herself.  She says that many groups are about AA and alcolhol and "I'm tried of hearing the same sad stories over and over", and "I don't have an alcohol problem.  Says she is eager to get home and be with her 3 children who bring her joy.  Asked patient to think about being able to name her support system and her safety plan.

## 2014-08-18 NOTE — Progress Notes (Signed)
D: Pt denies SI/HI/AVH. Pt is pleasant and cooperative. Pt stated she was ready to leave.  A: Pt was offered support and encouragement. Pt was given scheduled medications. Pt was encourage to attend groups. Q 15 minute checks were done for safety.  R: Pt is taking medication. Pt has no complaints.Pt receptive to treatment and safety maintained on unit.

## 2014-08-18 NOTE — [facility] Group Notes (Signed)
   [facility]  08/18/2014  8:45 AM   Participation Quality: Alert, Appropriate and Oriented  Mood/Affect: Depressed and Flat  Depression Rating: 2  Anxiety Rating: 2  Thoughts of Suicide: Pt denies SI/HI  Will you contract for safety? Yes  Current AVH: Pt denies  Plan for Discharge/Comments: Pt attended discharge planning group and actively participated in group. CSW provided pt with today's workbook. Patient reports feeling "good" today but presented with depressed and flat affect. Patient reports low levels of depression and anxiety. She inquired about when she will be able to be discharged, CSW explained hospital process. Patient can return home at discharge and has transportation. She plans to follow up with [facility] for outpatient services.  Transportation Means: Pt reports access to transportation  Supports: No supports mentioned at this time  Samuella BruinKristin Silvester Reierson, MSW, [employer]CSWA Clinical Social Worker [facility] [phone]

## 2014-08-18 NOTE — BHH Counselor (Signed)
Adult Comprehensive Assessment  Patient ID: Rose Holden, female   DOB: 06/16/1991, 23 y.o.   MRN: [id number]  Information Source: Information source: Patient  Current Stressors:  Physical health (include injuries & life threatening diseases): good health Bereavement / Loss: cousin died. she had open heart surgery and had massive heart attack.   Living/Environment/Situation:  Living Arrangements: Parent Living conditions (as described by patient or guardian): I was in prison for awhile and moved in with my mom when I got out.  How long has patient lived in current situation?: few months.  What is atmosphere in current home: Comfortable;Loving  Family History:  Marital status: Single Does patient have children?: Yes How many children?: 3 How is patient's relationship with their children?: one, three, and seven. They live in the home with me. We have a great relationship.   Childhood History:  By whom was/is the patient raised?: Mother;Grandparents Additional childhood history information: My mom raised me and I never knew my dad but i know he had SA problems.  Description of patient's relationship with caregiver when they were a child: close to mom. Patient's description of current relationship with people who raised him/her: strained with mom, but a typical relationship.  Does patient have siblings?: Yes Number of Siblings: 2 Description of patient's current relationship with siblings: Older brother and older sister. we are close. they live close by.  Did patient suffer any verbal/emotional/physical/sexual abuse as a child?: No Did patient suffer from severe childhood neglect?: No Has patient ever been sexually abused/assaulted/raped as an adolescent or adult?: No Was the patient ever a victim of a crime or a disaster?: No Witnessed domestic violence?: No Has patient been effected by domestic violence as an adult?: No  Education:  Highest grade of school patient has completed:  9th grade. After that I dropped out. GED.  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  Has patient ever been in the [employer]itary?: No Has patient ever served in combat?: No  Financial Resources:   Financial resources: Income from employment;Food stamps (I signed up for Medicaid) Does patient have a representative payee or guardian?: No  Alcohol/Substance Abuse:   What has been your use of drugs/alcohol within the last 12 months?: I don't drink alcohol. I snort cocaine every few months.  If attempted suicide, did drugs/alcohol play a role in this?: No Alcohol/Substance Abuse Treatment Hx: Past Tx, Inpatient If yes, describe treatment: [facility] I've seen several therapists outpatient. I used to see a psychiatrist but I stopped taking meds about ten years ago.  Has alcohol/substance abuse ever caused legal problems?: No  Pt took 20 OTC pills [facility] in what she originally identified as suicide attempt. However during assessment, she denied SI or any past attempts.   Social Support System:   Patient's Community Support System: Poor Describe Community Support System: I don't have many people in my life.  Type of faith/religion: christian How does patient's faith help to cope with current illness?: n/a   Leisure/Recreation:   Leisure and Hobbies: spending time with my kids  Strengths/Needs:   What things does  the patient do well?: good mom;  In what areas does patient struggle / problems for patient: coping skills. I need to work on my anger.   Discharge Plan:   Does patient have access to  transportation?: Yes (my aunt or my cousin can drive me to appts. ) Will patient be returning to same living situation after discharge?: Yes (return to [employer] house) Currently receiving community mental health services: Yes (From Whom) ([facility]) If no, would patient like referral for services when discharged?: Yes (What county[place] [facility] county) Does patient have financial barriers related to discharge medications?: No (Medicaid application processing. )  Summary/Recommendations:    Pt is 23 year old female living in [place], [place]) with her mother and her three children. She presents to [facility] voluntarily after ingesting 20 OTC pills in suicide attempt. Pt has prior diagnosis of Major Depressive Disorder. Pt reports cocaine use once every 2 months and stated that she snorted cocaine (unknown amt) prior to admission. She reports no HI, AVH, and no current SI. Recommendations for pt include: crisis stabilization, therapeutic milieu, encourage group attendance and participation, medication management for mood stabilization, and development of comprehensive mental wellness/sobriety plan. Pt plans to go to [facility] for med management and therapy at d/c.   Smart, Lebron Quam  08/18/2014

## 2014-08-19 MED ORDER — QUETIAPINE FUMARATE 100 MG PO TABS: 100.0000 mg | ORAL_TABLET | Freq: Every day | ORAL | Status: DC

## 2014-08-19 MED ORDER — FLUOXETINE HCL 20 MG PO CAPS: 20.0000 mg | ORAL_CAPSULE | Freq: Every day | ORAL | Status: DC

## 2014-08-19 NOTE — Progress Notes (Signed)
[facility] :  Will you be returning to the same living situation after discharge: Yes,  Patient is returning to her home. At discharge, do you have transportation home?:Yes,  Patient has arranged transportation. Do you have the ability to pay for your medications:No. Patient needs assistance with indigent medications   Release of information consent forms completed and in the chart;  Patient's signature needed at discharge.  Patient to Follow up at: Follow-up Information   [facility]/2014. (Walk in for hospital follow-up/assessment for services on this date betweeen 8am-12pm. referral number: [phone])    Contact information:   [address] [place], [place] [address] Phone: [phone] Fax: [phone]      Patient denies SI/HI:   Patient no longer endorsing SI/HI or other thoughts of self harm.   Safety Planning and Suicide Prevention discussed: .Reviewed with all patients during discharge planning group   Rose Holden, Rose Holden 08/19/2014, 12:36 PM

## 2014-08-19 NOTE — Discharge Summary (Signed)
Physician Discharge Summary Note  Patient:  Rose Holden is an 23 y.o., female MRN:  [id number] DOB:  1991/01/23 Patient phone:  [phone] (home)  Patient address:   [address] [place] [address],  Total Time spent with patient: Greater than 30 minutes  Date of Admission:  08/15/2014 Date of Discharge: 08/19/14  Reason for Admission: Mood stabilization treatment  Discharge Diagnoses: Principal Problem:   MDD (major depressive disorder), single episode   Psychiatric Specialty Exam: Physical Exam  Psychiatric: Her speech is normal and behavior is normal. Judgment and thought content normal. Her mood appears not anxious. Her affect is not angry, not blunt, not labile and not inappropriate. Cognition and memory are normal. She does not exhibit a depressed mood.    Review of Systems  Constitutional: Negative.   HENT: Negative.   Eyes: Negative.   Respiratory: Negative.   Cardiovascular: Negative.   Gastrointestinal: Negative.   Genitourinary: Negative.   Musculoskeletal: Negative.   Skin: Negative.   Neurological: Negative.   Endo/Heme/Allergies: Negative.   Psychiatric/Behavioral: Positive for depression (Stable), suicidal ideas and substance abuse (Hx of). Negative for hallucinations and memory loss. The patient has insomnia (Stable). The patient is not nervous/anxious.     Blood pressure 120/74, pulse 101, temperature 98.3 F (36.8 C), temperature source Oral, resp. rate 18, height 5\' 2"  (1.575 m), weight 87.544 kg (193 lb), last menstrual period 07/17/2014, SpO2 100.00%, unknown if currently breastfeeding.Body mass index is 35.29 kg/(m^2).  General Appearance: Well Groomed  Eye Contact:: Good  Speech: Normal Rate  Volume: Normal  Mood: describes improvement and denies depression at the current moment. Presents euthymic, with a full range, appropriate affect  Affect: Appropriate and Full Range  Thought Process: Goal Directed and Linear  Orientation: Full (Time,  Place, and Person)  Thought Content: denies hallucinations, no delusions expressed  Suicidal Thoughts: No- at this time denies any suicidal or homicidal ideations  Homicidal Thoughts: No  Memory: recent and remote grossly intact  Judgement: Fair  Insight: Good  Psychomotor Activity: Normal  Concentration: Good  Recall: Good  Fund of Knowledge:Good  Language: Good  Akathisia: No  Handed: Right  AIMS (if indicated): Assets: Desire for Improvement  Resilience  Sleep: Number of Hours: 5.5   Past Psychiatric History: Diagnosis: MDD (major depressive disorder), single episode  Hospitalizations: [facility], remotely  Outpatient Care:None reported  Substance Abuse Care: None reported  Self-Mutilation: None reported  Suicidal Attempts: Hx of by overdose  Violent Behaviors: NA   Musculoskeletal: Strength & Muscle Tone: within normal limits Gait & Station: normal Patient leans: N/A  DSM5: Schizophrenia Disorders:  NA Obsessive-Compulsive Disorders:  NA Trauma-Stressor Disorders:  NA Substance/Addictive Disorders:  Hx drug use Depressive Disorders:  MDD (major depressive disorder), single episode  Axis Diagnosis:  AXIS I:  MDD (major depressive disorder), single episode AXIS II:  Deferred AXIS III:   Past Medical History  Diagnosis Date  . Medical history non-contributory    AXIS IV:  economic problems and housing problems AXIS V:  63  Level of Care:  OP  Hospital Course: Rose Holden is 23 years old, African-American female. Admitted from the [facility]. She reports, "The ambulance took me to the hospital 3 days ago. I took some Advil PM pills. I was trying to commit suicide. A lot of things are going on in my life that are not so pleasant. It pertains to my living situation. I have been depressed for many months as a result. I had  a referral to go to the [facility] in [place], [place] for treatment, but have not gotten around it. I had attempted suicide a while ago, I  was taken to the [facility]. I need treatment for my depression because I have not been on medicines in a long time".   While a patient in this hospital, Rose Holden received mood stabilization treatments. She was medicated and discharged on Prozac 20 mg for depressions and Seroquel 100 mg for mood control. She was enrolled and participated the group counseling sessions being offered and held on this unit. She learned coping skills. Part of Aman's discharge plans is a referral and an appointment to an outpatient psychiatric for follow-up care and medication management. She presented no other serious medical issues that required treatments. She tolerated her treatment regimen without any adverse effects.  Rose Holden has completed mood stabilization treatments. Her symptoms did respond well. Her Mood is stable. This is confirmed by her reports of improved and absence of suicidal ideations. She will be receiving follow-up care at the [facility] in [place], [place]. Rose Holden is provided with all the pertinent information required to make this appointment without problems. Upon discharge, she adamantly denies any SIHI, AVH, delusional thoughts and paranoia. She received from the [facility] pharmacy, a 14 days worth, supply samples of her [facility] discharge medications. She left [facility] with all personal belongings in no distress. She arranged own transportation.  Consults:  psychiatry  Significant Diagnostic Studies:  labs: CBC with diff, CMP, UDS, toxicology tests, U/A  Discharge Vitals:   Blood pressure 120/74, pulse 101, temperature 98.3 F (36.8 C), temperature source Oral, resp. rate 18, height 5\' 2"  (1.575 m), weight 87.544 kg (193 lb), last menstrual period 07/17/2014, SpO2 100.00%, unknown if currently breastfeeding. Body mass index is 35.29 kg/(m^2). Lab Results:   No results found for this or any previous visit (from the past 72 hour(s)).  Physical Findings: AIMS: Facial and Oral Movements Muscles of Facial  Expression: None, normal Lips and Perioral Area: None, normal Jaw: None, normal Tongue: None, normal,Extremity Movements Upper (arms, wrists, hands, fingers): None, normal Lower (legs, knees, ankles, toes): None, normal, Trunk Movements Neck, shoulders, hips: None, normal, Overall Severity Severity of abnormal movements (highest score from questions above): None, normal Incapacitation due to abnormal movements: None, normal Patient's awareness of abnormal movements (rate only patient's report): No Awareness, Dental Status Current problems with teeth and/or dentures?: No Does patient usually wear dentures?: No  CIWA:    COWS:  COWS Total Score: 7  Psychiatric Specialty Exam: See Psychiatric Specialty Exam and Suicide Risk Assessment completed by Attending Physician prior to discharge.  Discharge destination:  Home  Is patient on multiple antipsychotic therapies at discharge:  No   Has Patient had three or more failed trials of antipsychotic monotherapy by history:  No  Recommended Plan for Multiple Antipsychotic Therapies: NA     Medication List       Indication   FLUoxetine 20 MG capsule  Commonly known as:  PROZAC  Take 1 capsule (20 mg total) by mouth daily. For  depression   Indication:  Major Depressive Disorder     QUEtiapine 100 MG tablet  Commonly known as:  SEROQUEL  Take 1 tablet (100 mg total) by mouth at bedtime. For mood control   Indication:  Mood control           Follow-up Information   Follow up with [facility] On 08/21/2014. (Walk in for hospital follow-up/assessment for services on  this date betweeen 8am-12pm. referral number: [id number])    Contact information:   [address] [place], [place] [address] Phone: [phone] Fax: [phone]     Follow-up recommendations:  Activity:  As tolerated Diet: As recommended by your primary care doctor. Keep all scheduled follow-up appointments as recommended.  Comments: Take all your medications as  prescribed by your mental healthcare provider. Report any adverse effects and or reactions from your medicines to your outpatient provider promptly. Patient is instructed and cautioned to not engage in alcohol and or illegal drug use while on prescription medicines. In the event of worsening symptoms, patient is instructed to call the crisis hotline, 911 and or go to the nearest ED for appropriate evaluation and treatment of symptoms. Follow-up with your primary care provider for your other medical issues, concerns and or health care needs.  Total Discharge Time:  Greater than 30 minutes.  Signed: Sanjuana Kavawoko, Agnes I, PMHNP-BC 08/19/2014, 11:36 AM  Patient seen, Suicide Assessment Completed.  Disposition Plan Reviewed

## 2014-08-19 NOTE — BHH Suicide Risk Assessment (Addendum)
Demographic Factors:  23 year old female. Currently unemployed.   Total Time spent with patient: 30 minutes  Psychiatric Specialty Exam: Physical Exam  ROS  Blood pressure 120/74, pulse 101, temperature 98.3 F (36.8 C), temperature source Oral, resp. rate 18, height 5\' 2"  (1.575 m), weight 87.544 kg (193 lb), last menstrual period 07/17/2014, SpO2 100.00%, unknown if currently breastfeeding.Body mass index is 35.29 kg/(m^2).  General Appearance: Well Groomed  Eye Contact::  Good  Speech:  Normal Rate  Volume:  Normal  Mood:  describes improvement and denies depression at the current moment. Presents euthymic, with a full range, appropriate affect   Affect:  Appropriate and Full Range  Thought Process:  Goal Directed and Linear  Orientation:  Full (Time, Place, and Person)  Thought Content:  denies hallucinations, no delusions expressed   Suicidal Thoughts:  No- at this time denies any suicidal or homicidal ideations  Homicidal Thoughts:  No  Memory:  recent and remote grossly intact   Judgement:  Fair  Insight:  Good  Psychomotor Activity:  Normal  Concentration:  Good  Recall:  Good  Fund of Knowledge:Good  Language: Good  Akathisia:  No  Handed:  Right  AIMS (if indicated):     Assets:  Desire for Improvement Resilience  Sleep:  Number of Hours: 5.5    Musculoskeletal: Strength & Muscle Tone: within normal limits Gait & Station: normal Patient leans: N/A   Mental Status Per Nursing Assessment::   On Admission:  Self-harm thoughts  Current Mental Status by Physician: As noted, at this time patient presents with improved mood, fuller range of affect, no thought disorder, no suicidal or homicidal ideations, no psychotic symptoms and is future oriented.   Loss Factors: Family stress, in particular describes feeling overly criticized by her mother. Unemployment  Historical Factors: history of depression which she attributes to psychosocial stressors  Risk  Reduction Factors:   Sense of responsibility to family and Positive coping skills or problem solving skills  Continued Clinical Symptoms:  At present  Symptoms are improved- patient is significantly improved compared to admission, and today denies depression plus seems euthymic. No SI or Hi endorsed. Behavior on unit in good control/calm.  Cognitive Features That Contribute To Risk:  No gross cognitive deficits noted- patient is alert and attentive and oriented x 3.   Suicide Risk:  Mild:  Suicidal ideation of limited frequency, intensity, duration, and specificity.  There are no identifiable plans, no associated intent, mild dysphoria and related symptoms, good self-control (both objective and subjective assessment), few other risk factors, and identifiable protective factors, including available and accessible social support.  Discharge Diagnoses:   AXIS I:  Major Depression, without psychotic features AXIS II:  Deferred AXIS III:   Past Medical History  Diagnosis Date  . Medical history non-contributory    AXIS IV:  family stress, particularly insofar as her relationship with mother. Currently  unemployed  AXIS V:  61-70 mild symptoms  Plan Of Care/Follow-up recommendations:  Activity:  As tolerated Diet:  Regular Tests:  NA Other:  See below  Is patient on multiple antipsychotic therapies at discharge:  No   Has Patient had three or more failed trials of antipsychotic monotherapy by history:  No  Recommended Plan for Multiple Antipsychotic Therapies: NA  Patient is leaving unit in good spirits. [facility], and has appointment there on 08/21/14.  Patient states she goes to Urgent Care or to Health Department for medical  issues if needed.    COBOS, FERNANDO 08/19/2014, 12:20 PM

## 2014-08-19 NOTE — Tx Team (Signed)
Interdisciplinary Treatment Plan Update   Date Reviewed:  08/19/2014  Time Reviewed:  8:23 AM  Progress in Treatment:   Attending groups: Yes Participating in groups: Yes Taking medication as prescribed: Yes  Tolerating medication: Yes Family/Significant other contact made:  Yes, collateral contact with mother. Patient understands diagnosis: Yes, patient understands diagnosis Discussing patient identified problems/goals with staff: Yes Medical problems stabilized or resolved: Yes Denies suicidal/homicidal ideation: Yes Patient has not harmed self or others: Yes  For review of initial/current patient goals, please see plan of care.  Estimated Length of Stay:  Discharge today  Reasons for Continued Hospitalization:   New Problems/Goals identified:    Discharge Plan or Barriers:   Home [facility]  Additional Comments:    Patient and CSW reviewed patient's identified goals and treatment plan.  Patient verbalized understanding and agreed to treatment plan.   Attendees:  Patient:  08/19/2014 8:23 AM   Signature:  Sallyanne HaversF. Cobos, MD 08/19/2014 8:23 AM  Signature: Geoffery LyonsIrving Lugo, MD 08/19/2014 8:23 AM  Signature:  Roswell Minersonna Shimp, RN 08/19/2014 8:23 AM  Signature: 08/19/2014 8:23 AM  Signature:  08/19/2014 8:23 AM  Signature:  Juline PatchQuylle Jaidy Cottam, LCSW 08/19/2014 8:23 AM  Signature:  Belenda CruiseKristin Drinkard, LCSW-A 08/19/2014 8:23 AM  Signature:  Leisa LenzValerie Enoch, Care Coordinator [facility] 08/19/2014 8:23 AM  Signature:   08/19/2014 8:23 AM  Signature: Leighton ParodyBritney Tyson, RN 08/19/2014  8:23 AM  Signature:   Onnie BoerJennifer Clark, RN [facility] 08/19/2014  8:23 AM  Signature:  08/19/2014  8:23 AM    Scribe for Treatment Team:   Juline PatchQuylle Ernesha Ramone,  08/19/2014 8:23 AM

## 2014-08-19 NOTE — BHH Group Notes (Signed)
The focus of this group is to educate the patient on the purpose and policies of crisis stabilization and provide a format to answer questions about their admission.  The group details unit policies and expectations of patients while admitted. Patient did not attend this group. 

## 2014-08-19 NOTE — Progress Notes (Signed)
D: Pt is appropriate in affect and pleasant in mood. Pt is reporting a minimal amount of anxiety and depression this evening. Pt is currently denying any SI/HI/AVH. Pt actively participates within the milieu. Pt has no concerns she wishes for writer to address at this time.  D: Pt is in affect and in mood. Pt rates her depression at a level out of ten and her anxiety as a . Pt attended group this evening. Pt observed interacting appropriately within the milieu.  A: Writer administered scheduled medications to pt. Continued support and availability as needed was extended to this pt. Staff continue to monitor pt with q2915min checks.  R: No adverse drug reactions noted. Pt receptive to treatment. Pt remains safe at this time.

## 2014-08-22 NOTE — Progress Notes (Signed)
Patient Discharge Instructions:  After Visit Summary (AVS):   Faxed to:  08/22/14 Discharge Summary Note:   Faxed to:  08/22/14 Psychiatric Admission Assessment Note:   Faxed to:  08/22/14 Suicide Risk Assessment - Discharge Assessment:   Faxed to:  08/22/14 Faxed/Sent to the Next Level Care provider:  08/22/14 [facility] to [facility] @ [phone]  Jerelene ReddenSheena E Urania, 08/22/2014, 3:20 PM

## 2014-09-15 ENCOUNTER — Encounter (HOSPITAL_COMMUNITY): Payer: Self-pay | Admitting: *Deleted

## 2014-10-26 ENCOUNTER — Encounter (HOSPITAL_COMMUNITY): Payer: Self-pay | Admitting: Emergency Medicine

## 2014-10-26 ENCOUNTER — Emergency Department (HOSPITAL_COMMUNITY)
Admission: EM | Admit: 2014-10-26 | Discharge: 2014-10-26 | Disposition: A | Discharge status: Home / Self Care | Payer: Medicaid Other | Attending: Emergency Medicine | Admitting: Emergency Medicine

## 2014-10-26 DIAGNOSIS — Z792 Long term (current) use of antibiotics: Secondary | ICD-10-CM | POA: Insufficient documentation

## 2014-10-26 DIAGNOSIS — K029 Dental caries, unspecified: Secondary | ICD-10-CM

## 2014-10-26 DIAGNOSIS — K088 Other specified disorders of teeth and supporting structures: Secondary | ICD-10-CM | POA: Insufficient documentation

## 2014-10-26 DIAGNOSIS — Z79899 Other long term (current) drug therapy: Secondary | ICD-10-CM | POA: Diagnosis not present

## 2014-10-26 DIAGNOSIS — Z720 Tobacco use: Secondary | ICD-10-CM | POA: Diagnosis not present

## 2014-10-26 MED ORDER — TRAMADOL HCL 50 MG PO TABS: 50.0000 mg | ORAL_TABLET | Freq: Four times a day (QID) | ORAL | Status: DC | PRN

## 2014-10-26 MED ORDER — AMOXICILLIN 500 MG PO CAPS: 500.0000 mg | ORAL_CAPSULE | Freq: Three times a day (TID) | ORAL | Status: DC

## 2014-10-26 NOTE — ED Notes (Signed)
Patient c/o right upper and lower dental pain that started Thursday and is progressively getting worse. Per patient pain radaites into right ear and head. Patient reports taking two 800mg  Ibuprofen this morning at 8 with no relief.

## 2014-10-26 NOTE — Discharge Instructions (Signed)
Take Advil in addition to the medications we give you

## 2014-10-26 NOTE — ED Provider Notes (Signed)
CSN: [id number]     Arrival date & time 10/26/14  1318 History  This chart was scribed for non-physician practitioner, Kerrie BuffaloHope Camy Leder, PA-C,working with Vida RollerBrian D Miller, MD, by Karle PlumberJennifer Tensley, ED Scribe. This patient was seen in room APFT21/APFT21 and the patient's care was started at 2:18 PM.  Chief Complaint  Patient presents with  . Dental Pain   The history is provided by the patient. No language interpreter was used.    HPI Comments:  [place] D Mayford Knifeurner is a 23 y.o. female who presents to the Emergency Department complaining of severe right upper and lower dental pain that began three days ago. Pt reports taking BC Powder and 2 Ibuprofen 800 mg with no significant relief of the pain. Eating makes the pain worse and pt denies any alleviating factors. Denies fever, chills, nausea, vomiting or inability to swallow. Pt does not currently have a dentist but states she made an appt with one but it is not in the near future.  Past Medical History  Diagnosis Date  . Medical history non-contributory    Past Surgical History  Procedure Laterality Date  . Broken leg      closed reduction  . No past surgeries     Family History  Problem Relation Age of Onset  . Hypertension Mother   . Cancer Maternal Grandmother    History  Substance Use Topics  . Smoking status: Current Every Day Smoker -- 0.50 packs/day for 5 years    Types: Cigarettes  . Smokeless tobacco: Never Used  . Alcohol Use: Yes     Comment: occasionally   OB History    Gravida Para Term Preterm AB TAB SAB Ectopic Multiple Living   3 3 3       3      Review of Systems  HENT: Positive for dental problem.   All other systems reviewed and are negative.   Allergies  Review of patient's allergies indicates no known allergies.  Home Medications   Prior to Admission medications   Medication Sig Start Date End Date Taking? Authorizing Provider  FLUoxetine (PROZAC) 20 MG capsule Take 1 capsule (20 mg total) by mouth daily. For   depression 08/19/14  Yes Sanjuana KavaAgnes I Nwoko, NP  ibuprofen (ADVIL,MOTRIN) 200 MG tablet Take 400 mg by mouth every 6 (six) hours as needed for moderate pain.   Yes Historical Provider, MD  QUEtiapine (SEROQUEL) 100 MG tablet Take 1 tablet (100 mg total) by mouth at bedtime. For mood control 08/19/14  Yes Sanjuana KavaAgnes I Nwoko, NP  amoxicillin (AMOXIL) 500 MG capsule Take 1 capsule (500 mg total) by mouth 3 (three) times daily. 10/26/14   Abdulai Blaylock Orlene OchM Jorgina Binning, NP  traMADol (ULTRAM) 50 MG tablet Take 1 tablet (50 mg total) by mouth every 6 (six) hours as needed. 10/26/14   Onyekachi Gathright Orlene OchM Zurri Rudden, NP   Triage Vitals: BP 119/93 mmHg  Pulse 60  Temp(Src) 98.2 F (36.8 C) (Oral)  Resp 17  Ht 5\' 2"  (1.575 m)  Wt 215 lb (97.523 kg)  BMI 39.31 kg/m2  SpO2 95%  LMP 10/04/2014 Physical Exam  Constitutional: She is oriented to person, place, and time. She appears well-developed and well-nourished.  HENT:  Mouth/Throat:    Tooth number 29 with surrounding gingival swelling and erythema. Tender to palpation.  Eyes: EOM are normal.  Neck: Neck supple.  Cardiovascular: Normal rate, regular rhythm and normal heart sounds.  Exam reveals no gallop and no friction rub.   No murmur heard. Pulmonary/Chest: Effort  normal and breath sounds normal. No respiratory distress. She has no wheezes. She has no rales.  Abdominal: Soft. There is no tenderness.  Musculoskeletal: Normal range of motion.  Lymphadenopathy:    She has cervical adenopathy.       Right cervical: Superficial cervical adenopathy present.  Neurological: She is alert and oriented to person, place, and time. No cranial nerve deficit.  Skin: Skin is warm and dry.  Nursing note and vitals reviewed.   ED Course  Procedures (including critical care time) DIAGNOSTIC STUDIES: Oxygen Saturation is 95% on RA, adequate by my interpretation.   COORDINATION OF CARE: 2:21 PM- Will prescribe Amoxicillin and Tramadol. Advised pt to keep appt with dentist and take OTC Advil as  directed on bottle. Pt verbalizes understanding and agrees to plan.   MDM  23 y.o. female with dental pain due to caries. Will treat for pain and infection and she will follow up with a dentists as soon as possible. Stable for discharge without fever or signs of sepsis.    Medication List    TAKE these medications        amoxicillin 500 MG capsule  Commonly known as:  AMOXIL  Take 1 capsule (500 mg total) by mouth 3 (three) times daily.     traMADol 50 MG tablet  Commonly known as:  ULTRAM  Take 1 tablet (50 mg total) by mouth every 6 (six) hours as needed.      ASK your doctor about these medications        FLUoxetine 20 MG capsule  Commonly known as:  PROZAC  Take 1 capsule (20 mg total) by mouth daily. For  depression     ibuprofen 200 MG tablet  Commonly known as:  ADVIL,MOTRIN  Take 400 mg by mouth every 6 (six) hours as needed for moderate pain.     QUEtiapine 100 MG tablet  Commonly known as:  SEROQUEL  Take 1 tablet (100 mg total) by mouth at bedtime. For mood control        Final diagnoses:  Pain due to dental caries   I personally performed the services described in this documentation, which was scribed in my presence. The recorded information has been reviewed and is accurate.    [address]Sharifah Champine Mad RiverM Rosalba Totty, NP 10/26/14 1710  Vida RollerBrian D Miller, MD 10/26/14 [phone]

## 2015-02-04 ENCOUNTER — Other Ambulatory Visit: Payer: Self-pay | Admitting: Obstetrics & Gynecology

## 2015-02-10 ENCOUNTER — Encounter (INDEPENDENT_AMBULATORY_CARE_PROVIDER_SITE_OTHER): Payer: Medicaid Other | Admitting: Obstetrics & Gynecology

## 2015-02-10 ENCOUNTER — Encounter: Payer: Self-pay | Admitting: Obstetrics & Gynecology

## 2015-02-10 VITALS — BP 110/70 | HR 72 | Ht 62.0 in | Wt 220.0 lb

## 2015-02-10 DIAGNOSIS — Z3201 Encounter for pregnancy test, result positive: Secondary | ICD-10-CM

## 2015-02-10 LAB — POCT URINE PREGNANCY: Preg Test, Ur: POSITIVE

## 2015-02-10 NOTE — Progress Notes (Signed)
Patient ID: Rose Holden, female   DOB: 11/07/1991, 24 y.o.   MRN: [id number] Subjective:    Pregnancy test was positive and given patient's insurance could not do exam today OB visit was scheduled  This encounter was created in error - please disregard.

## 2015-03-16 ENCOUNTER — Ambulatory Visit: Payer: Medicaid Other | Admitting: Obstetrics and Gynecology

## 2015-03-16 ENCOUNTER — Encounter: Payer: Self-pay | Admitting: Obstetrics and Gynecology

## 2015-10-25 ENCOUNTER — Encounter (HOSPITAL_COMMUNITY): Payer: Self-pay | Admitting: Emergency Medicine

## 2015-10-25 ENCOUNTER — Emergency Department (HOSPITAL_COMMUNITY)
Admission: EM | Admit: 2015-10-25 | Discharge: 2015-10-25 | Disposition: A | Discharge status: Home / Self Care | Payer: Medicaid Other | Attending: Emergency Medicine | Admitting: Emergency Medicine

## 2015-10-25 DIAGNOSIS — Z3202 Encounter for pregnancy test, result negative: Secondary | ICD-10-CM | POA: Insufficient documentation

## 2015-10-25 DIAGNOSIS — Z202 Contact with and (suspected) exposure to infections with a predominantly sexual mode of transmission: Secondary | ICD-10-CM | POA: Insufficient documentation

## 2015-10-25 DIAGNOSIS — A5901 Trichomonal vulvovaginitis: Secondary | ICD-10-CM | POA: Diagnosis not present

## 2015-10-25 DIAGNOSIS — R102 Pelvic and perineal pain: Secondary | ICD-10-CM | POA: Diagnosis present

## 2015-10-25 DIAGNOSIS — A599 Trichomoniasis, unspecified: Secondary | ICD-10-CM

## 2015-10-25 DIAGNOSIS — F329 Major depressive disorder, single episode, unspecified: Secondary | ICD-10-CM | POA: Diagnosis not present

## 2015-10-25 DIAGNOSIS — F17210 Nicotine dependence, cigarettes, uncomplicated: Secondary | ICD-10-CM | POA: Diagnosis not present

## 2015-10-25 DIAGNOSIS — Z79899 Other long term (current) drug therapy: Secondary | ICD-10-CM | POA: Insufficient documentation

## 2015-10-25 LAB — URINALYSIS, ROUTINE W REFLEX MICROSCOPIC
BILIRUBIN URINE: NEGATIVE
Glucose, UA: NEGATIVE mg/dL
Hgb urine dipstick: NEGATIVE
KETONES UR: NEGATIVE mg/dL
NITRITE: NEGATIVE
PROTEIN: NEGATIVE mg/dL
Specific Gravity, Urine: 1.024 (ref 1.005–1.030)
pH: 7 (ref 5.0–8.0)

## 2015-10-25 LAB — CBC
HCT: 39 % (ref 36.0–46.0)
HEMOGLOBIN: 13 g/dL (ref 12.0–15.0)
MCH: 30.7 pg (ref 26.0–34.0)
MCHC: 33.3 g/dL (ref 30.0–36.0)
MCV: 92 fL (ref 78.0–100.0)
PLATELETS: 403 10*3/uL — AB (ref 150–400)
RBC: 4.24 MIL/uL (ref 3.87–5.11)
RDW: 12.7 % (ref 11.5–15.5)
WBC: 6.1 10*3/uL (ref 4.0–10.5)

## 2015-10-25 LAB — I-STAT BETA HCG BLOOD, ED (MC, WL, AP ONLY)

## 2015-10-25 LAB — COMPREHENSIVE METABOLIC PANEL
ALBUMIN: 3.8 g/dL (ref 3.5–5.0)
ALK PHOS: 54 U/L (ref 38–126)
ALT: 13 U/L — ABNORMAL LOW (ref 14–54)
ANION GAP: 8 (ref 5–15)
AST: 15 U/L (ref 15–41)
BILIRUBIN TOTAL: 0.7 mg/dL (ref 0.3–1.2)
BUN: 8 mg/dL (ref 6–20)
CALCIUM: 9.2 mg/dL (ref 8.9–10.3)
CO2: 23 mmol/L (ref 22–32)
CREATININE: 0.86 mg/dL (ref 0.44–1.00)
Chloride: 106 mmol/L (ref 101–111)
GFR calc Af Amer: >60 mL/min (ref >60)
GFR calc non Af Amer: >60 mL/min (ref >60)
GLUCOSE: 90 mg/dL (ref 65–99)
Potassium: 3.8 mmol/L (ref 3.5–5.1)
Sodium: 137 mmol/L (ref 135–145)
TOTAL PROTEIN: 6.9 g/dL (ref 6.5–8.1)

## 2015-10-25 LAB — URINE MICROSCOPIC-ADD ON
Bacteria, UA: NONE SEEN
RBC / HPF: NONE SEEN RBC/hpf (ref 0–5)

## 2015-10-25 LAB — WET PREP, GENITAL
Clue Cells Wet Prep HPF POC: NONE SEEN
Sperm: NONE SEEN
Yeast Wet Prep HPF POC: NONE SEEN

## 2015-10-25 LAB — LIPASE, BLOOD: LIPASE: 22 U/L (ref 11–51)

## 2015-10-25 MED ORDER — AZITHROMYCIN 250 MG PO TABS: 1000.0000 mg | ORAL_TABLET | Freq: Once | ORAL | Status: DC

## 2015-10-25 MED ORDER — CEFTRIAXONE SODIUM 250 MG IJ SOLR: 250.0000 mg | Freq: Once | INTRAMUSCULAR | Status: DC

## 2015-10-25 MED ORDER — AZITHROMYCIN 250 MG PO TABS
1000.0000 mg | ORAL_TABLET | Freq: Once | ORAL | Status: AC
  Administered 2015-10-25: 1000 mg via ORAL
  Filled 2015-10-25: qty 4

## 2015-10-25 MED ORDER — CEFTRIAXONE SODIUM 250 MG IJ SOLR
250.0000 mg | Freq: Once | INTRAMUSCULAR | Status: AC
  Administered 2015-10-25: 250 mg via INTRAMUSCULAR
  Filled 2015-10-25: qty 250

## 2015-10-25 MED ORDER — METRONIDAZOLE 500 MG PO TABS
2000.0000 mg | ORAL_TABLET | Freq: Once | ORAL | Status: AC
  Administered 2015-10-25: 2000 mg via ORAL
  Filled 2015-10-25: qty 4

## 2015-10-25 MED ORDER — LIDOCAINE HCL (PF) 1 % IJ SOLN
5.0000 mL | Freq: Once | INTRAMUSCULAR | Status: AC
  Administered 2015-10-25: 5 mL
  Filled 2015-10-25: qty 5

## 2015-10-25 NOTE — Discharge Instructions (Signed)
Sexually Transmitted Disease A sexually transmitted disease (STD) is a disease or infection often passed to another person during sex. However, STDs can be passed through nonsexual ways. An STD can be passed through:  Spit (saliva).  Semen.  Blood.  Mucus from the vagina.  Pee (urine). HOW CAN I LESSEN MY CHANCES OF GETTING AN STD?  Use:  Latex condoms.  Water-soluble lubricants with condoms. Do not use petroleum jelly or oils.  Dental dams. These are small pieces of latex that are used as a barrier during oral sex.  Avoid having more than one sex partner.  Do not have sex with someone who has other sex partners.  Do not have sex with anyone you do not know or who is at high risk for an STD.  Avoid risky sex that can break your skin.  Do not have sex if you have open sores on your mouth or skin.  Avoid drinking too much alcohol or taking illegal drugs. Alcohol and drugs can affect your good judgment.  Avoid oral and anal sex acts.  Get shots (vaccines) for HPV and hepatitis.  If you are at risk of being infected with HIV, it is advised that you take a certain medicine daily to prevent HIV infection. This is called pre-exposure prophylaxis (PrEP). You may be at risk if:  You are a man who has sex with other men (MSM).  You are attracted to the opposite sex (heterosexual) and are having sex with more than one partner.  You take drugs with a needle.  You have sex with someone who has HIV.  Talk with your doctor about if you are at high risk of being infected with HIV. If you begin to take PrEP, get tested for HIV first. Get tested every 3 months for as long as you are taking PrEP.  Get tested for STDs every year if you are sexually active. If you are treated for an STD, get tested again 3 months after you are treated. WHAT SHOULD I DO IF I THINK I HAVE AN STD?  See your doctor.  Tell your sex partner(s) that you have an STD. They should be tested and treated.  Do  not have sex until your doctor says it is okay. WHEN SHOULD I GET HELP? Get help right away if:  You have bad belly (abdominal) pain.  You are a man and have puffiness (swelling) or pain in your testicles.  You are a woman and have puffiness in your vagina.   This information is not intended to replace advice given to you by your health care provider. Make sure you discuss any questions you have with your health care provider.   Document Released: 12/08/2004 Document Revised: 11/21/2014 Document Reviewed: 04/26/2013 Elsevier Interactive Patient Education 2016 [employer] Trichomoniasis Trichomoniasis is an infection caused by an organism called Trichomonas. The infection can affect both women and men. In women, the outer female genitalia and the vagina are affected. In men, the penis is mainly affected, but the prostate and other reproductive organs can also be involved. Trichomoniasis is a sexually transmitted infection (STI) and is most often passed to another person through sexual contact.  RISK FACTORS  Having unprotected sexual intercourse.  Having sexual intercourse with an infected partner. SIGNS AND SYMPTOMS  Symptoms of trichomoniasis in women include:  Abnormal gray-green frothy vaginal discharge.  Itching and irritation of the vagina.  Itching and irritation of the area outside the vagina. Symptoms of trichomoniasis in men include:  Penile discharge with or without pain.  Pain during urination. This results from inflammation of the urethra. DIAGNOSIS  Trichomoniasis may be found during a Pap test or physical exam. Your health care provider may use one of the following methods to help diagnose this infection:  Testing the pH of the vagina with a test tape.  Using a vaginal swab test that checks for the Trichomonas organism. A test is available that provides results within a few minutes.  Examining a urine sample.  Testing vaginal secretions. Your health care  provider may test you for other STIs, including HIV. TREATMENT   You may be given medicine to fight the infection. Women should inform their health care provider if they could be or are pregnant. Some medicines used to treat the infection should not be taken during pregnancy.  Your health care provider may recommend over-the-counter medicines or creams to decrease itching or irritation.  Your sexual partner will need to be treated if infected.  Your health care provider may test you for infection again 3 months after treatment. HOME CARE INSTRUCTIONS   Take medicines only as directed by your health care provider.  Take over-the-counter medicine for itching or irritation as directed by your health care provider.  Do not have sexual intercourse while you have the infection.  Women should not douche or wear tampons while they have the infection.  Discuss your infection with your partner. Your partner may have gotten the infection from you, or you may have gotten it from your partner.  Have your sex partner get examined and treated if necessary.  Practice safe, informed, and protected sex.  See your health care provider for other STI testing. SEEK MEDICAL CARE IF:   You still have symptoms after you finish your medicine.  You develop abdominal pain.  You have pain when you urinate.  You have bleeding after sexual intercourse.  You develop a rash.  Your medicine makes you sick or makes you throw up (vomit). MAKE SURE YOU:  Understand these instructions.  Will watch your condition.  Will get help right away if you are not doing well or get worse.   This information is not intended to replace advice given to you by your health care provider. Make sure you discuss any questions you have with your health care provider.   Document Released: 04/26/2001 Document Revised: 11/21/2014 Document Reviewed: 08/12/2013 Elsevier Interactive Patient Education [employer]

## 2015-10-25 NOTE — ED Notes (Addendum)
Pt c/o vaginal pain and discharge x 2 days. Pt also reports pain to abdomen and back.

## 2015-10-25 NOTE — ED Provider Notes (Signed)
CSN: [id number]     Arrival date & time 10/25/15  1127 History   First MD Initiated Contact with Patient 10/25/15 1212     Chief Complaint  Patient presents with  . Vaginal Pain     (Consider location/radiation/quality/duration/timing/severity/associated sxs/prior Treatment) HPI 24 year old female comes in today stating that she was called by her sexual partners and told that she been exposed to gonorrhea 2 days ago. She has had some vaginal discharge but denies any pain. She denies any fever, chills, vomiting, or diarrhea. Sexually active. She states she has not been she is pregnant with a normal menstrual cycle 2 weeks ago. She is not using any birth control. She has been pregnant 3 times in the past and has had 3 children. Past Medical History  Diagnosis Date  . Medical history non-contributory   . Depression    Past Surgical History  Procedure Laterality Date  . Broken leg      closed reduction  . No past surgeries     Family History  Problem Relation Age of Onset  . Hypertension Mother   . Cancer Maternal Grandmother    Social History  Substance Use Topics  . Smoking status: Current Every Day Smoker -- 0.50 packs/day for 5 years    Types: Cigarettes  . Smokeless tobacco: Never Used  . Alcohol Use: Yes     Comment: occasionally   OB History    Gravida Para Term Preterm AB TAB SAB Ectopic Multiple Living   3 3 3       3      Review of Systems  All other systems reviewed and are negative.     Allergies  Review of patient's allergies indicates no known allergies.  Home Medications   Prior to Admission medications   Medication Sig Start Date End Date Taking? Authorizing Provider  amoxicillin (AMOXIL) 500 MG capsule Take 1 capsule (500 mg total) by mouth 3 (three) times daily. Patient not taking: Reported on 02/10/2015 10/26/14   Janne NapoleonHope M Neese, NP  FLUoxetine (PROZAC) 20 MG capsule Take 1 capsule (20 mg total) by mouth daily. For  depression 08/19/14   Sanjuana KavaAgnes I  Nwoko, NP  ibuprofen (ADVIL,MOTRIN) 200 MG tablet Take 400 mg by mouth every 6 (six) hours as needed for moderate pain.    Historical Provider, MD  QUEtiapine (SEROQUEL) 100 MG tablet Take 1 tablet (100 mg total) by mouth at bedtime. For mood control 08/19/14   Sanjuana KavaAgnes I Nwoko, NP  traMADol (ULTRAM) 50 MG tablet Take 1 tablet (50 mg total) by mouth every 6 (six) hours as needed. Patient not taking: Reported on 02/10/2015 10/26/14   Janne NapoleonHope M Neese, NP   BP 125/95 mmHg  Pulse 76  Temp(Src) 98.6 F (37 C) (Oral)  Resp 18  Ht 5\' 2"  (1.575 m)  Wt 86.24 kg  BMI 34.77 kg/m2  SpO2 99%  LMP 10/21/2015  Breastfeeding? No Physical Exam  Constitutional: She is oriented to person, place, and time. She appears well-developed and well-nourished. No distress.  HENT:  Head: Normocephalic and atraumatic.  Right Ear: External ear normal.  Left Ear: External ear normal.  Nose: Nose normal.  Eyes: Conjunctivae and EOM are normal. Pupils are equal, round, and reactive to light.  Neck: Normal range of motion. Neck supple.  Pulmonary/Chest: Effort normal.  Genitourinary: Uterus is not enlarged and not tender. Cervix exhibits discharge. Cervix exhibits no motion tenderness. Right adnexum displays no tenderness. Left adnexum displays no tenderness. No tenderness in  the vagina. Vaginal discharge found.  Musculoskeletal: Normal range of motion.  Neurological: She is alert and oriented to person, place, and time. She exhibits normal muscle tone. Coordination normal.  Skin: Skin is warm and dry.  Psychiatric: She has a normal mood and affect. Her behavior is normal. Thought content normal.  Nursing note and vitals reviewed.   ED Course  Procedures (including critical care time) Labs Review Labs Reviewed  WET PREP, GENITAL - Abnormal; Notable for the following:    Trich, Wet Prep PRESENT (*)    WBC, Wet Prep HPF POC MANY (*)    All other components within normal limits  COMPREHENSIVE METABOLIC PANEL -  Abnormal; Notable for the following:    ALT 13 (*)    All other components within normal limits  CBC - Abnormal; Notable for the following:    Platelets 403 (*)    All other components within normal limits  URINALYSIS, ROUTINE W REFLEX MICROSCOPIC (NOT AT [facility]) - Abnormal; Notable for the following:    APPearance CLOUDY (*)    Leukocytes, UA LARGE (*)    All other components within normal limits  URINE MICROSCOPIC-ADD ON - Abnormal; Notable for the following:    Squamous Epithelial / LPF 6-30 (*)    All other components within normal limits  LIPASE, BLOOD  RPR  HIV ANTIBODY (ROUTINE TESTING)  I-STAT BETA HCG BLOOD, ED ([facility], [facility], [facility] ONLY)  I-STAT BETA HCG BLOOD, ED ([facility], [facility], [facility] ONLY)  GC/CHLAMYDIA PROBE AMP ([place]) NOT AT [facility]    Imaging Review No results found. I have personally reviewed and evaluated these images and lab results as part of my medical decision-making.   EKG Interpretation None      MDM   Final diagnoses:  Trichomonal infection  STD exposure    Patient treated for gonorrhea and chlamydia here with Rocephin and Zithromax. Swabs taken are pending. Wet prep is significant for trichomonas and she is treated here with 2 g of Flagyl.   Margarita Grizzle, MD 10/25/15 1341

## 2015-10-26 LAB — GC/CHLAMYDIA PROBE AMP (~~LOC~~) NOT AT ARMC
CHLAMYDIA, DNA PROBE: NEGATIVE
NEISSERIA GONORRHEA: NEGATIVE

## 2015-10-26 LAB — RPR: RPR Ser Ql: NONREACTIVE

## 2015-10-26 LAB — HIV ANTIBODY (ROUTINE TESTING W REFLEX): HIV Screen 4th Generation wRfx: NONREACTIVE

## 2015-11-15 NOTE — L&D Delivery Note (Signed)
25 y.o. [id number] at [id number] delivered a viable female infant in cephalic, ROA position. No nuchal cord. Left anterior shoulder delivered with ease. 60 sec delayed cord clamping. Cord clamped x2 and cut. Placenta delivered spontaneously intact, with 3VC. Fundus firm on exam with massage and pitocin. Good hemostasis noted.  Laceration: None Suture: N/A Good hemostasis noted.  Mom and baby recovering in LDR.    Apgars: 8/9 Weight:  Pending, skin to skin    Jen MowElizabeth Elsi Stelzer, DO [facility] Fellow [facility] 09/08/2016, 4:43 PM

## 2016-01-06 ENCOUNTER — Emergency Department (HOSPITAL_COMMUNITY): Payer: Medicaid Other

## 2016-01-06 ENCOUNTER — Emergency Department (HOSPITAL_COMMUNITY)
Admission: EM | Admit: 2016-01-06 | Discharge: 2016-01-06 | Disposition: A | Discharge status: Home / Self Care | Payer: Medicaid Other | Attending: Emergency Medicine | Admitting: Emergency Medicine

## 2016-01-06 ENCOUNTER — Encounter (HOSPITAL_COMMUNITY): Payer: Self-pay | Admitting: Emergency Medicine

## 2016-01-06 DIAGNOSIS — F17210 Nicotine dependence, cigarettes, uncomplicated: Secondary | ICD-10-CM | POA: Diagnosis not present

## 2016-01-06 DIAGNOSIS — Z8659 Personal history of other mental and behavioral disorders: Secondary | ICD-10-CM | POA: Insufficient documentation

## 2016-01-06 DIAGNOSIS — B349 Viral infection, unspecified: Secondary | ICD-10-CM

## 2016-01-06 DIAGNOSIS — J029 Acute pharyngitis, unspecified: Secondary | ICD-10-CM | POA: Diagnosis present

## 2016-01-06 LAB — RAPID STREP SCREEN (MED CTR MEBANE ONLY): Streptococcus, Group A Screen (Direct): NEGATIVE

## 2016-01-06 MED ORDER — BENZONATATE 100 MG PO CAPS: 100.0000 mg | ORAL_CAPSULE | Freq: Three times a day (TID) | ORAL | Status: DC | PRN

## 2016-01-06 MED ORDER — ONDANSETRON HCL 4 MG PO TABS: 4.0000 mg | ORAL_TABLET | Freq: Three times a day (TID) | ORAL | Status: DC | PRN

## 2016-01-06 NOTE — Discharge Instructions (Signed)
°Emergency Department Resource Guide °1) Find a Doctor and Pay Out of Pocket °Although you won't have to find out who is covered by your insurance plan, it is a good idea to ask around and get recommendations. You will then need to call the office and see if the doctor you have chosen will accept you as a new patient and what types of options they offer for patients who are self-pay. Some doctors offer discounts or will set up payment plans for their patients who do not have insurance, but you will need to ask so you aren't surprised when you get to your appointment. ° °2) Contact Your Local Health Department °Not all health departments have doctors that can see patients for sick visits, but many do, so it is worth a call to see if yours does. If you don't know where your local health department is, you can check in your phone book. The CDC also has a tool to help you locate your state's health department, and many state websites also have listings of all of their local health departments. ° °3) Find a Walk-in Clinic °If your illness is not likely to be very severe or complicated, you may want to try a walk in clinic. These are popping up all over the country in pharmacies, drugstores, and shopping centers. They're usually staffed by nurse practitioners or physician assistants that have been trained to treat common illnesses and complaints. They're usually fairly quick and inexpensive. However, if you have serious medical issues or chronic medical problems, these are probably not your best option. ° °No Primary Care Doctor: °- Call [facility] at  832-8000 - they can help you locate a primary care doctor that  accepts your insurance, provides certain services, etc. °- Physician Referral Service- 1-800-533-3463 ° °Chronic Pain Problems: °Organization         Address  Phone   Notes  °[facility]  (336) 297-2271 Patients need to be referred by their primary care doctor.  ° °Medication  Assistance: °Organization         Address  Phone   Notes  °[place] [facility] [address] °[place], [place] [address] (336) 641-8030 --Must be a resident of [place] °-- Must have NO insurance coverage whatsoever (no Medicaid/ Medicare, etc.) °-- The pt. MUST have a primary care doctor that directs their care regularly and follows them in the community °  °[facility]  (866) 331-1348   °[employer]  (888) 892-1162   ° °Agencies that provide inexpensive medical care: °Organization         Address  Phone   Notes  °[facility]  (336) 832-8035   °[facility]    (336) 832-7272   °[facility] [address] °[place], [place] [address] (336) 832-4777   °[facility] [address], °[place] (336) 271-4999   °[facility]    (336) 373-0678   °[facility]    (336) 272-1050   °[facility] ° [address], [place] Phone:  (336) 832-4444, Fax:  (336) 832-4440 Hours of Operation:  9 am - 6 pm, M-F.  Also accepts Medicaid/Medicare and self-pay.  °[facility] ° [address], [place] Phone: (336) 832-3150, Fax: (336) 832-3151. Hours of Operation:  8:30 am - 5:30 pm, M-F.  Also accepts Medicaid and self-pay.  °[facility] [place] [address], [place] Phone: (336) 878-6027   °[facility] [address], [place], [place] (336)723-1848, Ext. 123 Mondays & Thursdays: 7-9 AM.  First 15 patients are seen on a first come, first serve basis. °  ° °Medicaid-accepting [place] Providers: ° °Organization         Address  Phone   Notes  °[facility] [address], [place] (336) 641-2100 Also accepts self-pay patients.  °[facility] [address], [place] ° (336) 856-9996   °[facility] [address], [place]  (336) 288-8857   °[facility] [address] [place] [address], [place] (336) 299-7000   °Veita Bland [address], [place]  ° (336) 373-1557 Only accepts [place] Access Medicaid patients after they have their name applied to their card.  ° °Self-Pay (no insurance) in [place]: ° °Organization         Address  Phone   Notes  °Sickle Cell Patients, [facility] [address], [place] (336) 832-1970   °[facility] [address], [place] (336) 832-4400   °[facility] ° [address], [place] (336) 992-4800   °[facility]/Dr. Osei-Bonsu ° [address] [place] [address], [place] or [address], [place] (336) 841-8500 Phone number for both [place] and [place] locations is the same.  °[facility] [address], [place] (336) 299-0000   °[facility] [address] [place] [address], [place] or [address] (336) 852-7530 °(336) 878-2260   °[facility] [address], [place] (336) 350-1642, phone; (336) 294-5005, fax Sees patients 1st and 3rd Saturday of every month.  Must not qualify for public or private insurance (i.e. Medicaid, Medicare, [place] Health Choice, Veterans' Benefits) • Household income should be no more than 200% of the poverty level •The clinic cannot treat you if you are pregnant or think you are pregnant • Sexually transmitted diseases are not treated at the clinic.  ° ° °Dental Care: °Organization         Address  Phone  Notes  °[place] [facility] [address], [place] (336) 641-6152 Accepts children up to age 21 who are enrolled in Medicaid or [place] Health Choice; pregnant women with a Medicaid card; and children who have applied for Medicaid or [place] Health Choice, but were declined, whose parents can pay a reduced fee at time of service.  °[place]  [facility] [place]  [address], [place] (336) 641-7733 Accepts children up to age 21 who are enrolled in Medicaid or [place] Health Choice; pregnant women with a Medicaid card; and children who have applied for Medicaid or [place] Health Choice, but were declined, whose parents can pay a reduced fee at time of service.  °[facility] ° [address], [place] (336) 641-4533 Patients are seen by appointment only. Walk-ins are not accepted. [facility] will see patients 18 years of age and older. °Monday - Tuesday (8am-5pm) °Most Wednesdays (8:30-5pm) °$30 per visit, cash only  °[facility] ° [address], [place] (336) 641-4533 Patients are seen by appointment only. Walk-ins are not accepted. [facility] will see patients 18 years of age and older. °One   Wednesday Evening (Monthly: Volunteer Based).  $30 per visit, cash only  °[facility]  (919) 537-3737 for adults; Children under age 4, call Graduate Pediatric Dentistry at (919) [phone]. Children aged 4-14, please call (919) [phone] to request a pediatric application. ° Dental services are provided in all areas of dental care including fillings, crowns and bridges, complete and partial dentures, implants, gum treatment, root canals, and extractions. Preventive care is also provided. Treatment is provided to both adults and children. °Patients are selected via a lottery and there is often a waiting list. °  °[facility] [address], °[place] ° (336) 763-8833 [email or web address] °  °[facility] [address], [place], [place] (336)723-1848, Ext. 123 Second and Fourth Thursday of each month, opens at 6:30 AM; Clinic ends at 9 AM.  Patients are seen on a first-come first-served basis, and a limited number are seen during each clinic.  ° °[facility] ° [address], [place], [place] (336) 723-7904    Eligibility Requirements °You must have lived in [place], or [place] counties for at least the last three months. °  You cannot be eligible for state or federal sponsored healthcare insurance, including Veterans Administration, Medicaid, or Medicare. °  You generally cannot be eligible for healthcare insurance through your employer.  °  How to apply: °Eligibility screenings are held every Tuesday and Wednesday afternoon from 1:00 pm until 4:00 pm. You do not need an appointment for the interview!  °[facility] [address], [place], [place] 336-631-2330   °[facility]  336-342-8273   °[facility]  336-703-3100   °[facility]  336-570-6415   ° °Behavioral Health Resources in the Community: °Intensive Outpatient Programs °Organization         Address  Phone  Notes  °[facility] [address], [place], [place] 336-878-6098   °[facility] [address], [place], [place] 336-832-9800   °[facility] [address], [place], [place] ° 336-882-2125   °[facility] [address],  °[place], [place] 1-800-853-5163 or 336-641-4981   °Substance Abuse Resources °Organization         Address  Phone  Notes  °[facility]  336-882-2125   °[facility]  336-784-9470   °[facility]  336-285-9073   °[facility]  336-845-3988   °Residential & Outpatient Substance Abuse Program  1-800-659-3381   °Psychological Services °Organization         Address  Phone  Notes  °[facility]  336- 832-9600   °[facility]  336- 378-7881   °[facility] [address], [place] 1-800-853-5163 or 336-641-4981   ° °Mobile Crisis Teams °Organization         Address  Phone  Notes  °[facility], Mobile Crisis Care Unit  1-877-626-1772   °Assertive °[facility] ° [address]  [place], [place] 336-834-9664   °Sharon DeEsch [address] °[place] 336-554-5454   ° °Self-Help/Support Groups °Organization         Address  Phone             Notes  °[facility]  - variety of support groups  336- 373-1402 Call for more information  °Narcotics Anonymous (NA), [facility] [address], °[place]  2 meetings at this location  ° °  Residential Treatment Programs Organization         Address  Phone  Notes  [facility] [address]    [place]  8-295-621-3086   [facility]  [address] [place] [address], [place], [place] 629-528-4132   [facility] [address] [place], [place] 440-102-7253 Admissions: 8am-3pm M-F  [facility] [address]    [place], [place] 664-403-4742   [facility] [address] [place], [place], [place] 595-638-7564   [facility] [address]  [place], [place] 332-951-8841   [facility] - Intensive Outpatient [address], [place], [place] 660-630-1601   [facility] [address]  [place], [place] 0-932-355-7322 or 706-745-2134   [facility] [address], [place], [place] 762-831-5176 Accepts Medicaid  [facility] [address]  [place] 1-607-371-0626 Substance Abuse/Addiction Treatment   [facility] Organization         Address  Phone  Notes  [facility]  (438) 686-8245   Angie Fava, PhD [address] [place], [place]   939-864-6191 or 610-038-8514   [facility]   [address] [place], [place] 548-131-9542   [facility] [address] [place], [place] (917)360-9971 Insurance/Medicaid/sponsorship through [facility] [address]                                    [place], [place] (321)490-3690 Therapy/tele-psych/case    [facility] [address] [place], [place] (404)635-2680    Dr. Lolly Mustache  628-202-8528   [facility]  [employer] [facility] Dept. 1) [address] [place] 2) [address] [place] 3)  [address], [place] 567-093-9528 6156182751  2050058688   [facility] Child Abuse Hotline (513)345-2777 or 207-520-3845 (After Hours)      Take the prescriptions as directed.  Increase your fluid intake (ie:  Gatoraide) for the next few days, as discussed.  Eat a bland diet and advance to your regular diet slowly as you can tolerate it.   Avoid full strength juices, as well as milk and milk products until your diarrhea has resolved.  Take over the counter tylenol and ibuprofen, as directed on packaging, as needed for discomfort.  Gargle with warm water several times per day to help with discomfort.  May also use over the counter sore throat pain medicines such as chloraseptic or sucrets, as directed on packaging, as needed for discomfort.  Take over the counter decongestant (such as sudafed), as directed on packaging, for the next week.  Use over the counter normal saline nasal spray, several times per day for the next 2 weeks.  Call your regular medical doctor tomorrow to schedule a follow up appointment in the next 2 to 3 days.  Return to the Emergency Department immediately if worsening.

## 2016-01-06 NOTE — ED Notes (Signed)
Pt c/o sore throat/n/v/d since yesterday.

## 2016-01-06 NOTE — ED Provider Notes (Signed)
CSN: [id number]     Arrival date & time 01/06/16  1806 History   First MD Initiated Contact with Patient 01/06/16 1854     Chief Complaint  Patient presents with  . Emesis  . Cough  . Sore Throat      HPI Pt was seen at 1900.  Per pt, c/o gradual onset and persistence of constant sore throat, runny/stuffy nose, sinus congestion, and cough for the past 2 days. Has been associated with generalized body aches/fatigue, as well as several intermittent episodes of N/V/D.  Pt states her child had similar symptoms last week. Denies fevers, no rash, no CP/SOB, no back pain, no dysuria, no abd pain.     Past Medical History  Diagnosis Date  . Medical history non-contributory   . Depression    Past Surgical History  Procedure Laterality Date  . Broken leg      closed reduction  . No past surgeries     Family History  Problem Relation Age of Onset  . Hypertension Mother   . Cancer Maternal Grandmother    Social History  Substance Use Topics  . Smoking status: Current Every Day Smoker -- 0.50 packs/day for 5 years    Types: Cigarettes  . Smokeless tobacco: Never Used  . Alcohol Use: Yes     Comment: occasionally   OB History    Gravida Para Term Preterm AB TAB SAB Ectopic Multiple Living   Review of Systems ROS: Statement: All systems negative except as marked or noted in the HPI; Constitutional: Negative for fever and chills. ; ; Eyes: Negative for eye pain, redness and discharge. ; ; ENMT: Negative for ear pain, hoarseness, +nasal congestion, sinus pressure and sore throat. ; ; Cardiovascular: Negative for chest pain, palpitations, diaphoresis, dyspnea and peripheral edema. ; ; Respiratory: +cough. Negative for wheezing and stridor. ; ; Gastrointestinal: +N/V/D. Negative for abdominal pain, blood in stool, hematemesis, jaundice and rectal bleeding. . ; ; Genitourinary: Negative for dysuria, flank pain and hematuria. ; ; Musculoskeletal: Negative for back pain and  neck pain. Negative for swelling and trauma.; ; Skin: Negative for pruritus, rash, abrasions, blisters, bruising and skin lesion.; ; Neuro: Negative for headache, lightheadedness and neck stiffness. Negative for weakness, altered level of consciousness , altered mental status, extremity weakness, paresthesias, involuntary movement, seizure and syncope.      Allergies  Review of patient's allergies indicates no known allergies.  Home Medications   Prior to Admission medications   Medication Sig Start Date End Date Taking? Authorizing Provider  amoxicillin (AMOXIL) 500 MG capsule Take 1 capsule (500 mg total) by mouth 3 (three) times daily. Patient not taking: Reported on 02/10/2015 10/26/14   Janne Napoleon, NP  FLUoxetine (PROZAC) 20 MG capsule Take 1 capsule (20 mg total) by mouth daily. For  depression Patient not taking: Reported on 01/06/2016 08/19/14   Sanjuana Kava, NP  ibuprofen (ADVIL,MOTRIN) 200 MG tablet Take 400 mg by mouth every 6 (six) hours as needed for moderate pain.    Historical Provider, MD  QUEtiapine (SEROQUEL) 100 MG tablet Take 1 tablet (100 mg total) by mouth at bedtime. For mood control Patient not taking: Reported on 01/06/2016 08/19/14   Sanjuana Kava, NP  traMADol (ULTRAM) 50 MG tablet Take 1 tablet (50 mg total) by mouth every 6 (six) hours as needed. Patient not taking: Reported on 02/10/2015 10/26/14   [facility]  Damian Leavell, NP   BP 127/67 mmHg  Pulse 88  Temp(Src) 97.4 F (36.3 C) (Oral)  Resp 18  Ht  (1.575 m)  Wt 180 lb (81.647 kg)  BMI 32.91 kg/m2  SpO2 99%  LMP 12/09/2015 Physical Exam  1905: Physical examination:  Nursing notes reviewed; Vital signs and O2 SAT reviewed;  Constitutional: Well developed, Well nourished, Well hydrated, In no acute distress; Head:  Normocephalic, atraumatic; Eyes: EOMI, PERRL, No scleral icterus; ENMT: TM's clear bilat. +edemetous nasal turbinates bilat with clear rhinorrhea. Mouth and pharynx without lesions. No tonsillar  exudates. No intra-oral edema. No submandibular or sublingual edema. No hoarse voice, no drooling, no stridor. No pain with manipulation of larynx. No trismus. Mouth and pharynx normal, Mucous membranes moist; Neck: Supple, Full range of motion, No lymphadenopathy; Cardiovascular: Regular rate and rhythm, No murmur, rub, or gallop; Respiratory: Breath sounds clear & equal bilaterally, No rales, rhonchi, wheezes.  Speaking full sentences with ease, Normal respiratory effort/excursion; Chest: Nontender, Movement normal; Abdomen: Soft, Nontender, Nondistended, Normal bowel sounds; Genitourinary: No CVA tenderness; Extremities: Pulses normal, No tenderness, No edema, No calf edema or asymmetry.; Neuro: AA&Ox3, Major CN grossly intact.  Speech clear. No gross focal motor or sensory deficits in extremities.; Skin: Color normal, Warm, Dry.   ED Course  Procedures (including critical care time) Labs Review  Imaging Review  I have personally reviewed and evaluated these images and lab results as part of my medical decision-making.   EKG Interpretation None      MDM  MDM Reviewed: previous chart, nursing note and vitals Interpretation: x-ray and labs      Results for orders placed or performed during the hospital encounter of 01/06/16  Rapid strep screen  Result Value Ref Range   Streptococcus, Group A Screen (Direct) NEGATIVE NEGATIVE   Dg Chest 2 View 01/06/2016  CLINICAL DATA:  Nonproductive cough with sore throat and congestion. EXAM: CHEST  2 VIEW COMPARISON:  None. FINDINGS: The heart size and mediastinal contours are within normal limits. Both lungs are clear. The visualized skeletal structures are unremarkable. IMPRESSION: Normal exam. Electronically Signed   By: Kennith Center M.D.   On: 01/06/2016 19:30    2100:  Workup reassuring. Tx symptomatically at this time. Dx and testing d/w pt.  Questions answered.  Verb understanding, agreeable to d/c home with outpt f/u.   Samuel Jester, DO 01/09/16 2018

## 2016-01-10 LAB — CULTURE, GROUP A STREP (THRC)

## 2016-02-17 ENCOUNTER — Encounter: Payer: Self-pay | Admitting: Adult Health

## 2016-02-17 ENCOUNTER — Ambulatory Visit (INDEPENDENT_AMBULATORY_CARE_PROVIDER_SITE_OTHER): Payer: Medicaid Other | Admitting: Adult Health

## 2016-02-17 VITALS — BP 110/78 | HR 70 | Ht 62.0 in | Wt 188.5 lb

## 2016-02-17 DIAGNOSIS — O3680X0 Pregnancy with inconclusive fetal viability, not applicable or unspecified: Secondary | ICD-10-CM | POA: Insufficient documentation

## 2016-02-17 DIAGNOSIS — Z3201 Encounter for pregnancy test, result positive: Secondary | ICD-10-CM

## 2016-02-17 DIAGNOSIS — R112 Nausea with vomiting, unspecified: Secondary | ICD-10-CM

## 2016-02-17 DIAGNOSIS — N898 Other specified noninflammatory disorders of vagina: Secondary | ICD-10-CM | POA: Diagnosis not present

## 2016-02-17 DIAGNOSIS — J301 Allergic rhinitis due to pollen: Secondary | ICD-10-CM

## 2016-02-17 DIAGNOSIS — Z3490 Encounter for supervision of normal pregnancy, unspecified, unspecified trimester: Secondary | ICD-10-CM

## 2016-02-17 DIAGNOSIS — Z9109 Other allergy status, other than to drugs and biological substances: Secondary | ICD-10-CM

## 2016-02-17 HISTORY — DX: Other allergy status, other than to drugs and biological substances: Z91.09

## 2016-02-17 HISTORY — DX: Encounter for supervision of normal pregnancy, unspecified, unspecified trimester: Z34.90

## 2016-02-17 HISTORY — DX: Other specified noninflammatory disorders of vagina: N89.8

## 2016-02-17 HISTORY — DX: Nausea with vomiting, unspecified: R11.2

## 2016-02-17 LAB — POCT WET PREP (WET MOUNT): WBC, Wet Prep HPF POC: POSITIVE

## 2016-02-17 LAB — POCT URINE PREGNANCY: PREG TEST UR: POSITIVE — AB

## 2016-02-17 MED ORDER — PRENATAL PLUS 27-1 MG PO TABS: 1.0000 | ORAL_TABLET | Freq: Every day | ORAL | Status: DC

## 2016-02-17 MED ORDER — PROMETHAZINE HCL 25 MG PO TABS: 25.0000 mg | ORAL_TABLET | Freq: Four times a day (QID) | ORAL | Status: DC | PRN

## 2016-02-17 MED ORDER — CETIRIZINE HCL 10 MG PO TABS: 10.0000 mg | ORAL_TABLET | Freq: Every day | ORAL | Status: DC

## 2016-02-17 NOTE — Patient Instructions (Signed)
First Trimester of Pregnancy The first trimester of pregnancy is from week 1 until the end of week 12 (months 1 through 3). A week after a sperm fertilizes an egg, the egg will implant on the wall of the uterus. This embryo will begin to develop into a baby. Genes from you and your partner are forming the baby. The female genes determine whether the baby is a boy or a girl. At 6-8 weeks, the eyes and face are formed, and the heartbeat can be seen on ultrasound. At the end of 12 weeks, all the baby's organs are formed.  Now that you are pregnant, you will want to do everything you can to have a healthy baby. Two of the most important things are to get good prenatal care and to follow your health care provider's instructions. Prenatal care is all the medical care you receive before the baby's birth. This care will help prevent, find, and treat any problems during the pregnancy and childbirth. BODY CHANGES Your body goes through many changes during pregnancy. The changes vary from woman to woman.   You may gain or lose a couple of pounds at first.  You may feel sick to your stomach (nauseous) and throw up (vomit). If the vomiting is uncontrollable, call your health care provider.  You may tire easily.  You may develop headaches that can be relieved by medicines approved by your health care provider.  You may urinate more often. Painful urination may mean you have a bladder infection.  You may develop heartburn as a result of your pregnancy.  You may develop constipation because certain hormones are causing the muscles that push waste through your intestines to slow down.  You may develop hemorrhoids or swollen, bulging veins (varicose veins).  Your breasts may begin to grow larger and become tender. Your nipples may stick out more, and the tissue that surrounds them (areola) may become darker.  Your gums may bleed and may be sensitive to brushing and flossing.  Dark spots or blotches (chloasma,  mask of pregnancy) may develop on your face. This will likely fade after the baby is born.  Your menstrual periods will stop.  You may have a loss of appetite.  You may develop cravings for certain kinds of food.  You may have changes in your emotions from day to day, such as being excited to be pregnant or being concerned that something may go wrong with the pregnancy and baby.  You may have more vivid and strange dreams.  You may have changes in your hair. These can include thickening of your hair, rapid growth, and changes in texture. Some women also have hair loss during or after pregnancy, or hair that feels dry or thin. Your hair will most likely return to normal after your baby is born. WHAT TO EXPECT AT YOUR PRENATAL VISITS During a routine prenatal visit:  You will be weighed to make sure you and the baby are growing normally.  Your blood pressure will be taken.  Your abdomen will be measured to track your baby's growth.  The fetal heartbeat will be listened to starting around week 10 or 12 of your pregnancy.  Test results from any previous visits will be discussed. Your health care provider may ask you:  How you are feeling.  If you are feeling the baby move.  If you have had any abnormal symptoms, such as leaking fluid, bleeding, severe headaches, or abdominal cramping.  If you are using any tobacco products,   including cigarettes, chewing tobacco, and electronic cigarettes.  If you have any questions. Other tests that may be performed during your first trimester include:  Blood tests to find your blood type and to check for the presence of any previous infections. They will also be used to check for low iron levels (anemia) and Rh antibodies. Later in the pregnancy, blood tests for diabetes will be done along with other tests if problems develop.  Urine tests to check for infections, diabetes, or protein in the urine.  An ultrasound to confirm the proper growth  and development of the baby.  An amniocentesis to check for possible genetic problems.  Fetal screens for spina bifida and Down syndrome.  You may need other tests to make sure you and the baby are doing well.  HIV (human immunodeficiency virus) testing. Routine prenatal testing includes screening for HIV, unless you choose not to have this test. HOME CARE INSTRUCTIONS  Medicines  Follow your health care provider's instructions regarding medicine use. Specific medicines may be either safe or unsafe to take during pregnancy.  Take your prenatal vitamins as directed.  If you develop constipation, try taking a stool softener if your health care provider approves. Diet  Eat regular, well-balanced meals. Choose a variety of foods, such as meat or vegetable-based protein, fish, milk and low-fat dairy products, vegetables, fruits, and whole grain breads and cereals. Your health care provider will help you determine the amount of weight gain that is right for you.  Avoid raw meat and uncooked cheese. These carry germs that can cause birth defects in the baby.  Eating four or five small meals rather than three large meals a day may help relieve nausea and vomiting. If you start to feel nauseous, eating a few soda crackers can be helpful. Drinking liquids between meals instead of during meals also seems to help nausea and vomiting.  If you develop constipation, eat more high-fiber foods, such as fresh vegetables or fruit and whole grains. Drink enough fluids to keep your urine clear or pale yellow. Activity and Exercise  Exercise only as directed by your health care provider. Exercising will help you:  Control your weight.  Stay in shape.  Be prepared for labor and delivery.  Experiencing pain or cramping in the lower abdomen or low back is a good sign that you should stop exercising. Check with your health care provider before continuing normal exercises.  Try to avoid standing for long  periods of time. Move your legs often if you must stand in one place for a long time.  Avoid heavy lifting.  Wear low-heeled shoes, and practice good posture.  You may continue to have sex unless your health care provider directs you otherwise. Relief of Pain or Discomfort  Wear a good support bra for breast tenderness.   Take warm sitz baths to soothe any pain or discomfort caused by hemorrhoids. Use hemorrhoid cream if your health care provider approves.   Rest with your legs elevated if you have leg cramps or low back pain.  If you develop varicose veins in your legs, wear support hose. Elevate your feet for 15 minutes, 3-4 times a day. Limit salt in your diet. Prenatal Care  Schedule your prenatal visits by the twelfth week of pregnancy. They are usually scheduled monthly at first, then more often in the last 2 months before delivery.  Write down your questions. Take them to your prenatal visits.  Keep all your prenatal visits as directed by your   health care provider. Safety  Wear your seat belt at all times when driving.  Make a list of emergency phone numbers, including numbers for family, friends, the hospital, and police and fire departments. General Tips  Ask your health care provider for a referral to a local prenatal education class. Begin classes no later than at the beginning of month 6 of your pregnancy.  Ask for help if you have counseling or nutritional needs during pregnancy. Your health care provider can offer advice or refer you to specialists for help with various needs.  Do not use hot tubs, steam rooms, or saunas.  Do not douche or use tampons or scented sanitary pads.  Do not cross your legs for long periods of time.  Avoid cat litter boxes and soil used by cats. These carry germs that can cause birth defects in the baby and possibly loss of the fetus by miscarriage or stillbirth.  Avoid all smoking, herbs, alcohol, and medicines not prescribed by  your health care provider. Chemicals in these affect the formation and growth of the baby.  Do not use any tobacco products, including cigarettes, chewing tobacco, and electronic cigarettes. If you need help quitting, ask your health care provider. You may receive counseling support and other resources to help you quit.  Schedule a dentist appointment. At home, brush your teeth with a soft toothbrush and be gentle when you floss. SEEK MEDICAL CARE IF:   You have dizziness.  You have mild pelvic cramps, pelvic pressure, or nagging pain in the abdominal area.  You have persistent nausea, vomiting, or diarrhea.  You have a bad smelling vaginal discharge.  You have pain with urination.  You notice increased swelling in your face, hands, legs, or ankles. SEEK IMMEDIATE MEDICAL CARE IF:   You have a fever.  You are leaking fluid from your vagina.  You have spotting or bleeding from your vagina.  You have severe abdominal cramping or pain.  You have rapid weight gain or loss.  You vomit blood or material that looks like coffee grounds.  You are exposed to [place] measles and have never had them.  You are exposed to fifth disease or chickenpox.  You develop a severe headache.  You have shortness of breath.  You have any kind of trauma, such as from a fall or a car accident.   This information is not intended to replace advice given to you by your health care provider. Make sure you discuss any questions you have with your health care provider.   Document Released: 10/25/2001 Document Revised: 11/21/2014 Document Reviewed: 09/10/2013 Elsevier Interactive Patient Education 2016 [employer] Eat often Take zyrtec Follow up in 1 week for [place]

## 2016-02-17 NOTE — Progress Notes (Signed)
Subjective:     Patient ID: Rose Holden, female   DOB: 11/28/1990, 25 y.o.   MRN: [id number]  HPI [name] is a 25 year old black female in for UPT.She is complaining of vaginal discharge, [facility] as partner was +, they have broken up.She is having sneezing and congestion and nausea and vomiting.She denies any bleeding or pain, is not thrilled about being pregnant, but doe not want another abortion.   Review of Systems Patient denies any headaches, hearing loss, fatigue, blurred vision, shortness of breath, chest pain, abdominal pain, problems with bowel movements, urination, or intercourse. No joint pain or mood swings.See HPI for positives  Reviewed past medical,surgical, social and family history. Reviewed medications and allergies.     Objective:   Physical Exam BP 110/78 mmHg  Pulse 70  Ht 5\' 2"  (1.575 m)  Wt 188 lb 8 oz (85.503 kg)  BMI 34.47 kg/m2  LMP 12/16/2015 UPT +, about 9 weeks by LMP with EDD 09/21/16, Skin warm and dry. Neck: mid line trachea, normal thyroid, good ROM, no lymphadenopathy noted. Lungs: clear to ausculation bilaterally. Cardiovascular: regular rate and rhythm. Pelvic external genitalia is normal in appearance no lesions, vagina: white discharge with odor,urethra has no lesions or masses noted, cervix:smooth and bulbous, uterus: normal size, shape and contour, non tender, no masses felt, adnexa: no masses or tenderness noted. Bladder is non tender and no masses felt. Wet prep:  +WBCs. GC/CHL obtained.  Discussed increasing fluids and eating often and take zyrtec for allergies, will get in in 1 week for US, and no sex til GC/CHL back.   Face time 15 minutes with 50 % counseling.  Assessment:       UPT + Pregnant Vaginal discharge  Nausea and vomiting Pollen allergies    Plan:    GC/CHL sent Rx prenatal plus #30 take 1 daily with 11 refills Eat often and increase fluids Rx phenergan 25 mg #30 take 1 every 6 hours prn N/V with 1  refill Rx zyrtec 10 mg OTC 1 daily Return in 1 week for dating US Review handout on first trimester

## 2016-02-18 ENCOUNTER — Telehealth: Payer: Self-pay | Admitting: *Deleted

## 2016-02-18 NOTE — Telephone Encounter (Signed)
Rose HooverJohnetta Holden called letting us know pt's hemoglobin today was 10.8. Pt is taking her prenatal vit daily and nausea med daily. I spoke with Drenda FreezeFran and she recommends OTC iron 1 daily on empty stomach with Vit C. Pt voiced understanding. JSY

## 2016-02-19 ENCOUNTER — Telehealth: Payer: Self-pay | Admitting: *Deleted

## 2016-02-19 LAB — GC/CHLAMYDIA PROBE AMP
CHLAMYDIA, DNA PROBE: NEGATIVE
Neisseria gonorrhoeae by PCR: NEGATIVE

## 2016-02-19 NOTE — Telephone Encounter (Signed)
Pt informed of negative GC/CHL results. Pt states has an appt for [place] 10, 2017.

## 2016-02-22 ENCOUNTER — Other Ambulatory Visit: Payer: Medicaid Other

## 2016-02-22 ENCOUNTER — Encounter: Payer: Self-pay | Admitting: *Deleted

## 2016-02-25 ENCOUNTER — Other Ambulatory Visit: Payer: Self-pay | Admitting: Obstetrics and Gynecology

## 2016-02-25 DIAGNOSIS — O3680X0 Pregnancy with inconclusive fetal viability, not applicable or unspecified: Secondary | ICD-10-CM

## 2016-02-29 ENCOUNTER — Other Ambulatory Visit: Payer: Medicaid Other

## 2016-02-29 ENCOUNTER — Encounter: Payer: Self-pay | Admitting: Obstetrics & Gynecology

## 2016-03-17 ENCOUNTER — Other Ambulatory Visit: Payer: Medicaid Other

## 2016-03-18 ENCOUNTER — Ambulatory Visit (INDEPENDENT_AMBULATORY_CARE_PROVIDER_SITE_OTHER): Payer: Medicaid Other

## 2016-03-18 ENCOUNTER — Other Ambulatory Visit: Payer: Self-pay | Admitting: Adult Health

## 2016-03-18 DIAGNOSIS — O3680X0 Pregnancy with inconclusive fetal viability, not applicable or unspecified: Secondary | ICD-10-CM | POA: Diagnosis not present

## 2016-03-18 NOTE — Progress Notes (Signed)
US 14+4 wks,single IUP,normal ov's bilat,pos fht 164 bpm,svp of fluid 3.2cm,post pl

## 2016-03-27 ENCOUNTER — Encounter (HOSPITAL_COMMUNITY): Payer: Self-pay | Admitting: *Deleted

## 2016-03-27 ENCOUNTER — Emergency Department (HOSPITAL_COMMUNITY)
Admission: EM | Admit: 2016-03-27 | Discharge: 2016-03-27 | Disposition: A | Discharge status: Home / Self Care | Payer: Medicaid Other | Attending: Emergency Medicine | Admitting: Emergency Medicine

## 2016-03-27 DIAGNOSIS — F329 Major depressive disorder, single episode, unspecified: Secondary | ICD-10-CM | POA: Diagnosis not present

## 2016-03-27 DIAGNOSIS — F17210 Nicotine dependence, cigarettes, uncomplicated: Secondary | ICD-10-CM | POA: Diagnosis not present

## 2016-03-27 DIAGNOSIS — O26892 Other specified pregnancy related conditions, second trimester: Secondary | ICD-10-CM | POA: Diagnosis present

## 2016-03-27 DIAGNOSIS — Z3490 Encounter for supervision of normal pregnancy, unspecified, unspecified trimester: Secondary | ICD-10-CM

## 2016-03-27 DIAGNOSIS — Z3A16 16 weeks gestation of pregnancy: Secondary | ICD-10-CM | POA: Diagnosis not present

## 2016-03-27 DIAGNOSIS — W010XXA Fall on same level from slipping, tripping and stumbling without subsequent striking against object, initial encounter: Secondary | ICD-10-CM | POA: Diagnosis not present

## 2016-03-27 DIAGNOSIS — R109 Unspecified abdominal pain: Secondary | ICD-10-CM | POA: Insufficient documentation
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
## 2016-03-27 NOTE — ED Notes (Signed)
Dr Glick in to assess 
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
## 2016-03-27 NOTE — Discharge Instructions (Signed)
You may take acetaminophen (Tylenol) as needed for pain.  Do not drink while you are pregnant - it can harm the baby!

## 2016-03-28 ENCOUNTER — Encounter: Payer: Medicaid Other | Admitting: Women's Health

## 2016-03-28 ENCOUNTER — Encounter: Payer: Self-pay | Admitting: Women's Health

## 2016-07-23 ENCOUNTER — Emergency Department (HOSPITAL_COMMUNITY)
Admission: EM | Admit: 2016-07-23 | Discharge: 2016-07-23 | Disposition: A | Discharge status: Home / Self Care | Payer: Medicaid Other | Attending: Emergency Medicine | Admitting: Emergency Medicine

## 2016-07-23 ENCOUNTER — Emergency Department (HOSPITAL_COMMUNITY): Payer: Medicaid Other

## 2016-07-23 ENCOUNTER — Encounter (HOSPITAL_COMMUNITY): Payer: Self-pay

## 2016-07-23 DIAGNOSIS — R29810 Facial weakness: Secondary | ICD-10-CM | POA: Diagnosis not present

## 2016-07-23 DIAGNOSIS — O99353 Diseases of the nervous system complicating pregnancy, third trimester: Secondary | ICD-10-CM | POA: Diagnosis present

## 2016-07-23 DIAGNOSIS — Z3A33 33 weeks gestation of pregnancy: Secondary | ICD-10-CM | POA: Insufficient documentation

## 2016-07-23 DIAGNOSIS — G510 Bell's palsy: Secondary | ICD-10-CM | POA: Diagnosis not present

## 2016-07-23 DIAGNOSIS — Z79899 Other long term (current) drug therapy: Secondary | ICD-10-CM | POA: Insufficient documentation

## 2016-07-23 DIAGNOSIS — R202 Paresthesia of skin: Secondary | ICD-10-CM

## 2016-07-23 DIAGNOSIS — O99333 Smoking (tobacco) complicating pregnancy, third trimester: Secondary | ICD-10-CM | POA: Diagnosis not present

## 2016-07-23 DIAGNOSIS — F17210 Nicotine dependence, cigarettes, uncomplicated: Secondary | ICD-10-CM | POA: Insufficient documentation

## 2016-07-23 LAB — CBC WITH DIFFERENTIAL/PLATELET
BASOS ABS: 0 10*3/uL (ref 0.0–0.1)
BASOS PCT: 0 %
EOS ABS: 0.1 10*3/uL (ref 0.0–0.7)
EOS PCT: 2 %
HCT: 33.7 % — ABNORMAL LOW (ref 36.0–46.0)
Hemoglobin: 11.2 g/dL — ABNORMAL LOW (ref 12.0–15.0)
Lymphocytes Relative: 25 %
Lymphs Abs: 1.4 10*3/uL (ref 0.7–4.0)
MCH: 28.6 pg (ref 26.0–34.0)
MCHC: 33.2 g/dL (ref 30.0–36.0)
MCV: 86.2 fL (ref 78.0–100.0)
Monocytes Absolute: 0.3 10*3/uL (ref 0.1–1.0)
Monocytes Relative: 5 %
NEUTROS PCT: 68 %
Neutro Abs: 3.7 10*3/uL (ref 1.7–7.7)
PLATELETS: 295 10*3/uL (ref 150–400)
RBC: 3.91 MIL/uL (ref 3.87–5.11)
RDW: 12.9 % (ref 11.5–15.5)
WBC: 5.5 10*3/uL (ref 4.0–10.5)

## 2016-07-23 LAB — URINALYSIS, ROUTINE W REFLEX MICROSCOPIC
Bilirubin Urine: NEGATIVE
GLUCOSE, UA: NEGATIVE mg/dL
HGB URINE DIPSTICK: NEGATIVE
KETONES UR: NEGATIVE mg/dL
Nitrite: NEGATIVE
PH: 6.5 (ref 5.0–8.0)
Protein, ur: NEGATIVE mg/dL
Specific Gravity, Urine: 1.014 (ref 1.005–1.030)

## 2016-07-23 LAB — COMPREHENSIVE METABOLIC PANEL
ALT: 16 U/L (ref 14–54)
AST: 17 U/L (ref 15–41)
Albumin: 3.1 g/dL — ABNORMAL LOW (ref 3.5–5.0)
Alkaline Phosphatase: 66 U/L (ref 38–126)
Anion gap: 11 (ref 5–15)
BILIRUBIN TOTAL: 0.2 mg/dL — AB (ref 0.3–1.2)
BUN: 6 mg/dL (ref 6–20)
CO2: 19 mmol/L — ABNORMAL LOW (ref 22–32)
CREATININE: 0.57 mg/dL (ref 0.44–1.00)
Calcium: 9.2 mg/dL (ref 8.9–10.3)
Chloride: 106 mmol/L (ref 101–111)
Glucose, Bld: 100 mg/dL — ABNORMAL HIGH (ref 65–99)
Potassium: 3.7 mmol/L (ref 3.5–5.1)
Sodium: 136 mmol/L (ref 135–145)
TOTAL PROTEIN: 6.1 g/dL — AB (ref 6.5–8.1)

## 2016-07-23 LAB — URINE MICROSCOPIC-ADD ON: RBC / HPF: NONE SEEN RBC/hpf (ref 0–5)

## 2016-07-23 MED ORDER — PREDNISONE 10 MG (21) PO TBPK: 10.0000 mg | ORAL_TABLET | Freq: Every day | ORAL | 0 refills | Status: DC

## 2016-07-23 MED ORDER — SODIUM CHLORIDE 0.9 % IV BOLUS (SEPSIS): 1000.0000 mL | Freq: Once | INTRAVENOUS | Status: DC

## 2016-07-23 MED ORDER — ACYCLOVIR 400 MG PO TABS: 400.0000 mg | ORAL_TABLET | Freq: Every day | ORAL | 0 refills | Status: DC

## 2016-07-23 MED ORDER — ACETAMINOPHEN 500 MG PO TABS
1000.0000 mg | ORAL_TABLET | Freq: Once | ORAL | Status: AC
  Administered 2016-07-23: 1000 mg via ORAL
  Filled 2016-07-23: qty 2

## 2016-07-23 NOTE — Progress Notes (Signed)
Spoke with Dr. Vergie LivingPickens. Pt is a G4P3 all vaginal deliveries, no complications with her pregnancies. Pt is here today with c/o numbness in the left side of her face. Had a headache that was relieved  with tylenol. Denies blurred vision, spots before her eyes, epigastric pain. No vaginal bleeding or leaking of fluid. Blood pressures and labs are within normal limits. Pt is to get a MRI of her head and IVF for ui. FHR is a category 1 tracing. Pt is OB cleared and may be dc'd by the Ed staff. ED MD can call him with any questions at [phone]. ED MD notified.

## 2016-07-23 NOTE — ED Provider Notes (Signed)
[facility]-EMERGENCY DEPT Provider Note   CSN: [id number] Arrival date & time: 07/23/16  1152     History   Chief Complaint Chief Complaint  Patient presents with  . Numbness  . Headache    HPI Rose Holden is a 25 y.o. female who is 8 months pregnant who presents emergency Department with chief complaint of left facial numbness and headache. Patient's dates of that on Monday, September 4. She noticed left facial numbness. The patient was released from prison this morning. States that she saw a nurse practitioner there who checked her and referred her to an outpatient doctor. However, she was released prior to evaluation. She states that on Wednesday, she began having a global headache which she rates at a 6 out of 10 as well. She did not get any medications prior to arrival. She denies any visual disturbances, difficulty with swallowing. She complains of difficulty with speech and occasional drooling on the left side of her mouth due to weakness on that side. She has noticed facial weakness in the mirror. She complains of shooting pain in the left jaw which is worse after chewing a lot as well as ear pain.  HPI  Past Medical History:  Diagnosis Date  . Depression   . Medical history non-contributory   . Nausea & vomiting 02/17/2016  . Pollen allergies 02/17/2016  . Pregnant 02/17/2016  . Vaginal discharge 02/17/2016    Patient Active Problem List   Diagnosis Date Noted  . Pregnant 02/17/2016  . Vaginal discharge 02/17/2016  . Nausea & vomiting 02/17/2016  . Pollen allergies 02/17/2016  . MDD (major depressive disorder), single episode 08/15/2014  . Supervision of other normal pregnancy 06/13/2013  . Trichomonal vaginitis in pregnancy 03/03/2013  . 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 Prenatal care insufficient 02/28/2013    Past Surgical History:  Procedure Laterality Date  . broken leg     closed reduction  . NO PAST SURGERIES      OB History    Gravida Para Term Preterm AB Living   4 3 3     3    SAB TAB Ectopic Multiple Live Births           3       Home Medications    Prior to Admission medications   Medication Sig Start Date End Date Taking? Authorizing Provider  acyclovir (ZOVIRAX) 400 MG tablet Take 1 tablet (400 mg total) by mouth 5 (five) times daily. 07/23/16   Arthor CaptainAbigail Kallie Depolo, PA-C  cetirizine (ZYRTEC) 10 MG tablet Take 1 tablet (10 mg total) by mouth daily. 02/17/16   Adline PotterJennifer A Griffin, NP  predniSONE (STERAPRED UNI-PAK 21 TAB) 10 MG (21) TBPK tablet Take 1 tablet (10 mg total) by mouth daily. Take 6 tabs by mouth daily  for 2 days, then 5 tabs for 2 days, then 4 tabs for 2 days, then 3 tabs for 2 days, 2 tabs for 2 days, then 1 tab by mouth daily for 2 days 07/23/16   Arthor CaptainAbigail Holmes [place], PA-C  prenatal vitamin w/FE, FA (PRENATAL 1 + 1) 27-1 MG TABS tablet Take 1 tablet by mouth daily at 12 noon. 02/17/16   Adline PotterJennifer A Griffin, NP  promethazine (PHENERGAN) 25 MG tablet Take 1 tablet (25 mg total) by mouth every 6 (six) hours as needed for nausea or vomiting. 02/17/16   Adline PotterJennifer A Griffin, NP    Family History Family History  Problem Relation Age of Onset  . Hypertension Mother   .  Cancer Maternal Grandmother     Social History Social History  Substance Use Topics  . Smoking status: Current Every Day Smoker    Packs/day: 0.00    Years: 11.00    Types: Cigarettes  . Smokeless tobacco: Never Used     Comment: smokes 10 cig daily  . Alcohol use Yes     Comment: every other day     Allergies   Review of patient's allergies indicates no known allergies.   Review of Systems Review of Systems  Ten systems reviewed and are negative for acute change, except as noted in the HPI.   Physical Exam Updated Vital Signs BP 129/75   Pulse 86   Temp 98.6 F (37 C) (Oral)   Resp 20   Ht 5\' 2"  (1.575 m)   Wt 102.5 kg   LMP 12/16/2015   SpO2 98%   BMI 41.34 kg/m   Physical Exam  Constitutional: She is oriented to person,  place, and time. She appears well-developed and well-nourished. No distress.  HENT:  Head: Normocephalic and atraumatic.  Mouth/Throat: Oropharynx is clear and moist.  BL TMs normal without lesion or rash. Left auricular adenopathy present.  Eyes: Conjunctivae and EOM are normal. Pupils are equal, round, and reactive to light. No scleral icterus.  No horizontal, vertical or rotational nystagmus  Neck: Normal range of motion. Neck supple.  Full active and passive ROM without pain No midline or paraspinal tenderness No nuchal rigidity or meningeal signs  Cardiovascular: Normal rate, regular rhythm and intact distal pulses.   Pulmonary/Chest: Effort normal and breath sounds normal. No respiratory distress. She has no wheezes. She has no rales.  Abdominal: Soft. Bowel sounds are normal. There is no tenderness. There is no rebound and no guarding.  Gravid abdomen  Musculoskeletal: Normal range of motion.  Lymphadenopathy:    She has no cervical adenopathy.  Neurological: She is alert and oriented to person, place, and time. She has normal reflexes. No cranial nerve deficit. She exhibits normal muscle tone. Coordination normal.  Mental Status:  Alert, oriented, thought content appropriate. Speech fluent without evidence of aphasia. Able to follow 2 step commands without difficulty.  Cranial Nerves:  II:  Peripheral visual fields grossly normal, pupils equal, round, reactive to light III,IV, VI: ptosis not present, extra-ocular motions intact bilaterally  V,VII:LEFT FACE WITH WEAKNESS, LEFT FOREHEAD WEAKNESS AS WELL, SENSATION ABNORMAL ON LEFT VIII: hearing grossly normal bilaterally  IX,X: midline uvula rise  XI: bilateral shoulder shrug equal and strong XII: midline tongue extension  Motor:  5/5 in upper and lower extremities bilaterally including strong and equal grip strength and dorsiflexion/plantar flexion Sensory: Pinprick and light touch normal in all extremities.  Deep Tendon  Reflexes: 2+ and symmetric  Cerebellar: normal finger-to-nose with bilateral upper extremities Gait: normal gait and balance CV: distal pulses palpable throughout   Skin: Skin is warm and dry. No rash noted. She is not diaphoretic.  Psychiatric: She has a normal mood and affect. Her behavior is normal. Judgment and thought content normal.  Nursing note and vitals reviewed.    ED Treatments / Results  Labs (all labs ordered are listed, but only abnormal results are displayed) Labs Reviewed  URINALYSIS, ROUTINE W REFLEX MICROSCOPIC (NOT AT [facility]) - Abnormal; Notable for the following:       Result Value   APPearance CLOUDY (*)    Leukocytes, UA SMALL (*)    All other components within normal limits  COMPREHENSIVE METABOLIC PANEL - Abnormal; Notable  for the following:    CO2 19 (*)    Glucose, Bld 100 (*)    Total Protein 6.1 (*)    Albumin 3.1 (*)    Total Bilirubin 0.2 (*)    All other components within normal limits  CBC WITH DIFFERENTIAL/PLATELET - Abnormal; Notable for the following:    Hemoglobin 11.2 (*)    HCT 33.7 (*)    All other components within normal limits  URINE MICROSCOPIC-ADD ON - Abnormal; Notable for the following:    Squamous Epithelial / LPF 6-30 (*)    Bacteria, UA FEW (*)    All other components within normal limits    EKG  EKG Interpretation None       Radiology Mr Brain Wo Contrast  Result Date: 07/23/2016 CLINICAL DATA:  Right-sided facial numbness and headaches for 5 days. Third trimester pregnancy. EXAM: MRI HEAD WITHOUT CONTRAST TECHNIQUE: Multiplanar, multiecho pulse sequences of the brain and surrounding structures were obtained without intravenous contrast. COMPARISON:  None. FINDINGS: Brain: The diffusion-weighted images demonstrate no evidence for acute or subacute infarction. No acute hemorrhage or mass lesion is present. The ventricles are normal size. No significant extraaxial fluid collection is present. Internal auditory canals are  within normal limits bilaterally. The brainstem and cerebellum are normal. The pituitary is prominent, compatible with pregnancy. Vascular: Flow is present in the major intracranial arteries. Skull and upper cervical spine: The skullbase is within normal limits. The cerebellar tonsils extend 5 mm below the foramen magnum in the midline. Midline sagittal structures are otherwise within normal limits. The upper cervical spine is unremarkable. Sinuses/Orbits: The paranasal sinuses and mastoid air cells are clear. Other: IMPRESSION: 1. No acute intracranial abnormality. 2. 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 The cerebellar tonsils extend 5 mm below the foramen magnum in the midline the remain somewhat rounded. Electronically Signed   By: Marin Roberts M.D.   On: 07/23/2016 16:08    Procedures Procedures (including critical care time)  Medications Ordered in ED Medications  acetaminophen (TYLENOL) tablet 1,000 mg (1,000 mg Oral Given 07/23/16 1241)     Initial Impression / Assessment and Plan / ED Course  I have reviewed the triage vital signs and the nursing notes.  Pertinent labs & imaging results that were available during my care of the patient were reviewed by me and considered in my medical decision making (see chart for details).  Clinical Course   Patient with left facial weakness. Seen in shared visit with Dr. Ranae Palms. We consult to Dr. Amada Jupiter from the neurology service, who recommends MRI. Patient MRI without acute abnormality. Dr. contacted has suggested we use Valtrex or acyclovir here and steroids. I spoke with Dr. Vergie Living of OB/GYN to get recommendations on the safety profile. These medications for the patient was pregnant at this time. Patient is able to take a Sterapred dose pack, along with acyclovir 400 mg 5 times daily. Patient is to follow-up with her OB/GYN. If she is unable to follow-up with Dr. Emelda Fear. She may follow-up at the [facility]  outpatient clinic. She is safe for discharge at this time.  Final Clinical Impressions(s) / ED Diagnoses   Final diagnoses:  Bell's palsy    New Prescriptions Discharge Medication List as of 07/23/2016  4:11 PM    START taking these medications   Details  acyclovir (ZOVIRAX) 400 MG tablet Take 1 tablet (400 mg total) by mouth 5 (five) times daily., Starting Sat 07/23/2016, Print    predniSONE (STERAPRED UNI-PAK 21  TAB) 10 MG (21) TBPK tablet Take 1 tablet (10 mg total) by mouth daily. Take 6 tabs by mouth daily  for 2 days, then 5 tabs for 2 days, then 4 tabs for 2 days, then 3 tabs for 2 days, 2 tabs for 2 days, then 1 tab by mouth daily for 2 days, Starting Sat 07/23/2016, Print         [place], PA-C 07/24/16 1548    Loren Racer, MD 07/27/16 [id number]

## 2016-07-23 NOTE — ED Notes (Signed)
OB Rapid response nurse is with pt

## 2016-07-23 NOTE — ED Notes (Signed)
Patient returned to room from MRI, upon this RN entering the room, patient was dressed and sitting in the chair on the phone calling her ride. Pt made aware that scan results are not back and encouraged to stay until they are, pt stated "I guess if anything is wrong they will just call me." this RN advised patient that she needs to wait in the ED to get her scan results and that I would have abigail harris, PA come in to speak with her before she leaves. PA made aware.

## 2016-07-23 NOTE — ED Notes (Signed)
Patient transported to MRI 

## 2016-07-23 NOTE — Consult Note (Signed)
Neurology Consultation Reason for Consult: Facial weakness Referring Physician: Arthor CaptainHarris, Abigail  CC: Facial weakness  History is obtained from: Patient  HPI: Rose Holden is a 25 y.o. female who presents with progressive facial weakness of the left face. She states that she first noticed it on Monday, and was released from prison this morning. She also notes some left facial pain and headache as well. She also describes left facial numbness also   ROS: A 14 point ROS was performed and is negative except as noted in the HPI.   Past Medical History:  Diagnosis Date  . Depression   . Medical history non-contributory   . Nausea & vomiting 02/17/2016  . Pollen allergies 02/17/2016  . Pregnant 02/17/2016  . Vaginal discharge 02/17/2016     Family History  Problem Relation Age of Onset  . Hypertension Mother   . Cancer Maternal Grandmother      Social History:  reports that she has been smoking Cigarettes.  She has been smoking about 0.00 packs per day for the past 11.00 years. She has never used smokeless tobacco. She reports that she drinks alcohol. She reports that she uses drugs, including Marijuana.   Exam: Current vital signs: BP 129/75   Pulse 86   Temp 98.6 F (37 C) (Oral)   Resp 20   Ht 5\' 2"  (1.575 m)   Wt 102.5 kg (226 lb)   LMP 12/16/2015   SpO2 98%   BMI 41.34 kg/m  Vital signs in last 24 hours: Temp:  [98.6 F (37 C)] 98.6 F (37 C) (09/09 1200) Pulse Rate:  [75-92] 86 (09/09 1609) Resp:  [20] 20 (09/09 1200) BP: (103-129)/(65-80) 129/75 (09/09 1609) SpO2:  [97 %-100 %] 98 % (09/09 1609) Weight:  [102.5 kg (226 lb)] 102.5 kg (226 lb) (09/09 1201)   Physical Exam  Constitutional: Appears well-developed and well-nourished.  Psych: Affect appropriate to situation Eyes: No scleral injection HENT: No OP obstrucion Head: Normocephalic.  Cardiovascular: Normal rate and regular rhythm.  Respiratory: Effort normal and breath sounds normal to anterior  ascultation GI: Soft.  No distension. There is no tenderness.  Skin: WDI  Neuro: Mental Status: Patient is awake, alert, oriented to person, place, month, year, and situation. Patient is able to give a clear and coherent history. No signs of aphasia or neglect Cranial Nerves: II: Visual Fields are full. Pupils are equal, round, and reactive to light.   III,IV, VI: EOMI without ptosis or diploplia.  V: Facial sensation is decreased on the left light touch and pinprick VII: Facial movement is decreased on the left in the upper and lower face VIII: hearing is intact to voice X: Uvula elevates symmetrically XI: Shoulder shrug is symmetric. XII: tongue is midline without atrophy or fasciculations.  Motor: Tone is normal. Bulk is normal. 5/5 strength was present in all four extremities.  Sensory: Sensation is symmetric to light touch and temperature in the arms and legs. Cerebellar: FNF are intact bilaterally      I have reviewed labs in epic and the results pertinent to this consultation are: CMP-relatively unremarkable  Impression: 25 year old female with left facial weakness and numbness in a peripheral distribution. My suspicion is that this represent spells palsy, but the numbness is unusual. Sometimes patients do perceive a difference in sensation simply because they're face is not moving, and I would advise getting an MRI to ensure that she does not have any abnormal findings to explain her facial weakness.  Recommendations: 1) MRI  head 2) if no abnormalities, then would start her on valacyclovir and steroids if not contradicted indicated from an OB perspective, though I think steroids may have some rest, and would discuss this with OB before performing.   Ritta Slot, MD [facility] [phone]  If 7pm- 7am, please page neurology on call as listed in AMION.

## 2016-07-23 NOTE — Progress Notes (Addendum)
Pt says she woke up Monday morning and the left side of her face was numb and then she started having pain in the left side of her face. Does not have a headache now, says she was given tylenol when she arrived at the hospital. Denies blurred vision, spots before her eyes and epigastric pain. Pt was d/c from prison today and says [facility]. Bloodpressures are WNL. Denies vaginal bleeding or leaking of fluid. Pt says her due date is 09/13/2016 which makes her 325/7 weeks.Pt says she had an [facility] ultrasound. Pt is a G4P3, all vaginal deliveries, no problems  with her pregnancies.

## 2016-07-23 NOTE — ED Notes (Signed)
While hourly rounding on the pt, the pt stated she did not want to have the ordered MRI and stated her head feels better.  Verbalized this to PA who talked with the pt.

## 2016-07-23 NOTE — Discharge Instructions (Signed)

## 2016-09-04 ENCOUNTER — Emergency Department (HOSPITAL_COMMUNITY)
Admission: EM | Admit: 2016-09-04 | Discharge: 2016-09-04 | Disposition: A | Discharge status: Home / Self Care | Payer: Medicaid Other | Attending: Emergency Medicine | Admitting: Emergency Medicine

## 2016-09-04 ENCOUNTER — Encounter (HOSPITAL_COMMUNITY): Payer: Self-pay | Admitting: Emergency Medicine

## 2016-09-04 DIAGNOSIS — Z3A40 40 weeks gestation of pregnancy: Secondary | ICD-10-CM | POA: Diagnosis not present

## 2016-09-04 DIAGNOSIS — Z87891 Personal history of nicotine dependence: Secondary | ICD-10-CM | POA: Insufficient documentation

## 2016-09-04 DIAGNOSIS — O479 False labor, unspecified: Secondary | ICD-10-CM

## 2016-09-04 NOTE — Progress Notes (Signed)
Spoke with the ED MD says pt's cervix is closed, thk, and high. Asked to adj fetal monitor so I can better assess the FHR.

## 2016-09-04 NOTE — Progress Notes (Signed)
Pt is aG4P3 at 38 6/[redacted] weeks gestation with c/o uterine contractions every 15 min. Denies vaginal bleeding or leaking of fluid. [facility] [place]. [place] baby in [place].[facility] ED staff asked to check the pt's cervix.

## 2016-09-04 NOTE — Progress Notes (Signed)
Spoke with [facility] staff, pt can be taken off the monitor, she is OB cleared and can be d/c home. Category 1 tracing.

## 2016-09-04 NOTE — ED Provider Notes (Signed)
[facility]-EMERGENCY DEPT Provider Note   CSN: [id number] Arrival date & time: 09/04/16  1258     History   Chief Complaint Chief Complaint  Patient presents with  . Contractions    HPI Rose D Mayford Knifeurner is a 25 y.o. female.  HPI 25 year old female who presents with contractions. She is G4 P3 at 6240 weeks gestational age, with due date of 09/12/2016. This is following at [place] woman[place] clinic [place], but states that she would like to have her baby in [place]. Started feeling contractions at about 9 PM yesterday. Thinks she is having contractions every 15 minutes. Has not had any leakage of fluids, but has noted some thick mucus recently. No abnormal vaginal bleeding. States that she had Bell's palsy during her pregnancy, but no other complications. Has had normal vaginal deliveries with prior pregnancies, without any complications.   Past Medical History:  Diagnosis Date  . Depression   . Medical history non-contributory   . Nausea & vomiting 02/17/2016  . Pollen allergies 02/17/2016  . Pregnant 02/17/2016  . Vaginal discharge 02/17/2016    Patient Active Problem List   Diagnosis Date Noted  . Pregnant 02/17/2016  . Vaginal discharge 02/17/2016  . Nausea & vomiting 02/17/2016  . Pollen allergies 02/17/2016  . MDD (major depressive disorder), single episode 08/15/2014  . Supervision of other normal pregnancy 06/13/2013  . Trichomonal vaginitis in pregnancy 03/03/2013  . 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 Prenatal care insufficient 02/28/2013    Past Surgical History:  Procedure Laterality Date  . broken leg     closed reduction  . NO PAST SURGERIES      OB History    Gravida Para Term Preterm AB Living   4 3 3     3    SAB TAB Ectopic Multiple Live Births           3       Home Medications    Prior to Admission medications   Medication Sig Start Date End Date Taking? Authorizing Provider  acyclovir (ZOVIRAX) 400 MG tablet Take 1 tablet (400  mg total) by mouth 5 (five) times daily. 07/23/16   Arthor CaptainAbigail Harris, PA-C  cetirizine (ZYRTEC) 10 MG tablet Take 1 tablet (10 mg total) by mouth daily. 02/17/16   Adline PotterJennifer A Griffin, NP  predniSONE (STERAPRED UNI-PAK 21 TAB) 10 MG (21) TBPK tablet Take 1 tablet (10 mg total) by mouth daily. Take 6 tabs by mouth daily  for 2 days, then 5 tabs for 2 days, then 4 tabs for 2 days, then 3 tabs for 2 days, 2 tabs for 2 days, then 1 tab by mouth daily for 2 days 07/23/16   Arthor CaptainAbigail Harris, PA-C  prenatal vitamin w/FE, FA (PRENATAL 1 + 1) 27-1 MG TABS tablet Take 1 tablet by mouth daily at 12 noon. 02/17/16   Adline PotterJennifer A Griffin, NP  promethazine (PHENERGAN) 25 MG tablet Take 1 tablet (25 mg total) by mouth every 6 (six) hours as needed for nausea or vomiting. 02/17/16   Adline PotterJennifer A Griffin, NP    Family History Family History  Problem Relation Age of Onset  . Hypertension Mother   . Cancer Maternal Grandmother     Social History Social History  Substance Use Topics  . Smoking status: Former Smoker    Packs/day: 0.00    Years: 11.00    Types: Cigarettes  . Smokeless tobacco: Never Used     Comment: smokes 10 cig daily  .  Alcohol use No     Comment: . Pt denies     Allergies   Review of patient's allergies indicates no known allergies.   Review of Systems Review of Systems 10/14 systems reviewed and are negative other than those stated in the HPI   Physical Exam Updated Vital Signs BP 127/80   Pulse 85   Temp 98.6 F (37 C) (Oral)   Resp 18   Ht 5\' 2"  (1.575 m)   Wt 225 lb (102.1 kg)   LMP 12/16/2015   SpO2 99%   BMI 41.15 kg/m   Physical Exam Physical Exam  Nursing note and vitals reviewed. Constitutional: Well developed, well nourished, non-toxic, and in no acute distress Head: Normocephalic and atraumatic.  Mouth/Throat: Oropharynx is clear and moist.  Neck: Normal range of motion. Neck supple.  Cardiovascular: Normal rate and regular rhythm.   Pulmonary/Chest: Effort normal  and breath sounds normal.  Abdominal: Soft. Gravid There is no tenderness. There is no rebound and no guarding.  Musculoskeletal: Normal range of motion.  Neurological: Alert, no facial droop, fluent speech, moves all extremities symmetrically Skin: Skin is warm and dry.  Psychiatric: Cooperative   ED Treatments / Results  Labs (all labs ordered are listed, but only abnormal results are displayed) Labs Reviewed - No data to display  EKG  EKG Interpretation None       Radiology No results found.  Procedures Procedures (including critical care time)  Medications Ordered in ED Medications - No data to display   Initial Impression / Assessment and Plan / ED Course  I have reviewed the triage vital signs and the nursing notes.  Pertinent labs & imaging results that were available during my care of the patient were reviewed by me and considered in my medical decision making (see chart for details).  Clinical Course   25 year old female G4P3 at [redacted] weeks GA who presents with contractions. She is well-appearing, in no acute distress, with stable vital signs. Abdomen gravid, but otherwise benign. Placed on monitor, with fetal heart rates of 140. Patient checked, with cervix high, closed, and thick. Spoke with Dr. Su Hilt from [facility]. Patient does not appear to have regular contractions on monitoring, and does not appear to be in labor. She is cleared from [facility] standpoint for discharge. Discussed follow-up with OB from [facility]. Strict return and follow-up instructions reviewed. She expressed understanding of all discharge instructions and felt comfortable with the plan of care.   Final Clinical Impressions(s) / ED Diagnoses   Final diagnoses:  Irregular uterine contractions    New Prescriptions Discharge Medication List as of 09/04/2016  1:56 PM       Lavera Guise, MD 09/04/16 [phone]

## 2016-09-04 NOTE — ED Triage Notes (Signed)
Pt states she started having contractions last night. Pt states contractions are approx 15 min apart. Pt denies fluid leakage or bleeding. Pt's fourth child.

## 2016-09-04 NOTE — Progress Notes (Signed)
Spoke with Dr. Emelda FearFerguson. Pt is a G4P3 at 38 6/[redacted] weeks gestation, all vaginal deliveries, at [facility] with c/o contractions every 15 min. No vaginal bleeding or leaking of fluid.I have asked the staff to check her cervix. Pt gets her [facility] [place] [facility], but says she wants to deliver here. FHR is 150 BPM. Says he will call [facility] and speak to the ED MD.

## 2016-09-04 NOTE — Discharge Instructions (Signed)
You do not appear to be in active labor.  [facility] to set up close follow-up appointment.  If you need to deliver, we encourage you to go to a hospital where there is an OB doctor who will be able to help you deliver, whether that is at [facility] or [place].   Return ED without fail for worsening symptoms, including regular contractions, your water breaking, vaginal bleeding or any other symptoms concerning to you.

## 2016-09-04 NOTE — Progress Notes (Signed)
Spoke with Dr. Emelda FearFerguson. ED MD says pt is closed, thk, high. Wants pt to have an NST and then pt can be dc'd home. Says he will call the ED MD and give her further instructions.

## 2016-09-06 ENCOUNTER — Encounter (HOSPITAL_COMMUNITY): Payer: Self-pay

## 2016-09-06 ENCOUNTER — Inpatient Hospital Stay (HOSPITAL_COMMUNITY)
Admission: AD | Admit: 2016-09-06 | Discharge: 2016-09-06 | Disposition: A | Discharge status: Home / Self Care | Payer: Medicaid Other | Source: Ambulatory Visit | Attending: Obstetrics & Gynecology | Admitting: Obstetrics & Gynecology

## 2016-09-06 DIAGNOSIS — Z3493 Encounter for supervision of normal pregnancy, unspecified, third trimester: Secondary | ICD-10-CM | POA: Insufficient documentation

## 2016-09-06 NOTE — MAU Note (Signed)
Patient presents with c/o ctx every 10 mins. Patient denies any LOF. fETUS ACTIVE.

## 2016-09-06 NOTE — Discharge Instructions (Signed)
Fetal Movement Counts °Patient Name: __________________________________________________ Patient Due Date: ____________________ °Performing a fetal movement count is highly recommended in high-risk pregnancies, but it is good for every pregnant woman to do. Your health care provider may ask you to start counting fetal movements at 28 weeks of the pregnancy. Fetal movements often increase: °· After eating a full meal. °· After physical activity. °· After eating or drinking something sweet or cold. °· At rest. °Pay attention to when you feel the baby is most active. This will help you notice a pattern of your baby's sleep and wake cycles and what factors contribute to an increase in fetal movement. It is important to perform a fetal movement count at the same time each day when your baby is normally most active.  °HOW TO COUNT FETAL MOVEMENTS °1. Find a quiet and comfortable area to sit or lie down on your left side. Lying on your left side provides the best blood and oxygen circulation to your baby. °2. Write down the day and time on a sheet of paper or in a journal. °3. Start counting kicks, flutters, swishes, rolls, or jabs in a 2-hour period. You should feel at least 10 movements within 2 hours. °4. If you do not feel 10 movements in 2 hours, wait 2-3 hours and count again. Look for a change in the pattern or not enough counts in 2 hours. °SEEK MEDICAL CARE IF: °· You feel less than 10 counts in 2 hours, tried twice. °· There is no movement in over an hour. °· The pattern is changing or taking longer each day to reach 10 counts in 2 hours. °· You feel the baby is not moving as he or she usually does. °Date: ____________ Movements: ____________ Start time: ____________ Finish time: ____________  °Date: ____________ Movements: ____________ Start time: ____________ Finish time: ____________ °Date: ____________ Movements: ____________ Start time: ____________ Finish time: ____________ °Date: ____________ Movements:  ____________ Start time: ____________ Finish time: ____________ °Date: ____________ Movements: ____________ Start time: ____________ Finish time: ____________ °Date: ____________ Movements: ____________ Start time: ____________ Finish time: ____________ °Date: ____________ Movements: ____________ Start time: ____________ Finish time: ____________ °Date: ____________ Movements: ____________ Start time: ____________ Finish time: ____________  °Date: ____________ Movements: ____________ Start time: ____________ Finish time: ____________ °Date: ____________ Movements: ____________ Start time: ____________ Finish time: ____________ °Date: ____________ Movements: ____________ Start time: ____________ Finish time: ____________ °Date: ____________ Movements: ____________ Start time: ____________ Finish time: ____________ °Date: ____________ Movements: ____________ Start time: ____________ Finish time: ____________ °Date: ____________ Movements: ____________ Start time: ____________ Finish time: ____________ °Date: ____________ Movements: ____________ Start time: ____________ Finish time: ____________  °Date: ____________ Movements: ____________ Start time: ____________ Finish time: ____________ °Date: ____________ Movements: ____________ Start time: ____________ Finish time: ____________ °Date: ____________ Movements: ____________ Start time: ____________ Finish time: ____________ °Date: ____________ Movements: ____________ Start time: ____________ Finish time: ____________ °Date: ____________ Movements: ____________ Start time: ____________ Finish time: ____________ °Date: ____________ Movements: ____________ Start time: ____________ Finish time: ____________ °Date: ____________ Movements: ____________ Start time: ____________ Finish time: ____________  °Date: ____________ Movements: ____________ Start time: ____________ Finish time: ____________ °Date: ____________ Movements: ____________ Start time: ____________ Finish  time: ____________ °Date: ____________ Movements: ____________ Start time: ____________ Finish time: ____________ °Date: ____________ Movements: ____________ Start time: ____________ Finish time: ____________ °Date: ____________ Movements: ____________ Start time: ____________ Finish time: ____________ °Date: ____________ Movements: ____________ Start time: ____________ Finish time: ____________ °Date: ____________ Movements: ____________ Start time: ____________ Finish time: ____________  °Date: ____________ Movements: ____________ Start time: ____________ Finish   time: ____________ °Date: ____________ Movements: ____________ Start time: ____________ Finish time: ____________ °Date: ____________ Movements: ____________ Start time: ____________ Finish time: ____________ °Date: ____________ Movements: ____________ Start time: ____________ Finish time: ____________ °Date: ____________ Movements: ____________ Start time: ____________ Finish time: ____________ °Date: ____________ Movements: ____________ Start time: ____________ Finish time: ____________ °Date: ____________ Movements: ____________ Start time: ____________ Finish time: ____________  °Date: ____________ Movements: ____________ Start time: ____________ Finish time: ____________ °Date: ____________ Movements: ____________ Start time: ____________ Finish time: ____________ °Date: ____________ Movements: ____________ Start time: ____________ Finish time: ____________ °Date: ____________ Movements: ____________ Start time: ____________ Finish time: ____________ °Date: ____________ Movements: ____________ Start time: ____________ Finish time: ____________ °Date: ____________ Movements: ____________ Start time: ____________ Finish time: ____________ °Date: ____________ Movements: ____________ Start time: ____________ Finish time: ____________  °Date: ____________ Movements: ____________ Start time: ____________ Finish time: ____________ °Date: ____________  Movements: ____________ Start time: ____________ Finish time: ____________ °Date: ____________ Movements: ____________ Start time: ____________ Finish time: ____________ °Date: ____________ Movements: ____________ Start time: ____________ Finish time: ____________ °Date: ____________ Movements: ____________ Start time: ____________ Finish time: ____________ °Date: ____________ Movements: ____________ Start time: ____________ Finish time: ____________ °Date: ____________ Movements: ____________ Start time: ____________ Finish time: ____________  °Date: ____________ Movements: ____________ Start time: ____________ Finish time: ____________ °Date: ____________ Movements: ____________ Start time: ____________ Finish time: ____________ °Date: ____________ Movements: ____________ Start time: ____________ Finish time: ____________ °Date: ____________ Movements: ____________ Start time: ____________ Finish time: ____________ °Date: ____________ Movements: ____________ Start time: ____________ Finish time: ____________ °Date: ____________ Movements: ____________ Start time: ____________ Finish time: ____________ °  °This information is not intended to replace advice given to you by your health care provider. Make sure you discuss any questions you have with your health care provider. °  °Document Released: 11/30/2006 Document Revised: 11/21/2014 Document Reviewed: 08/27/2012 °Elsevier Interactive Patient Education ©2016 [employer] °Braxton Hicks Contractions °Contractions of the uterus can occur throughout pregnancy. Contractions are not always a sign that you are in labor.  °WHAT ARE BRAXTON HICKS CONTRACTIONS?  °Contractions that occur before labor are called Braxton Hicks contractions, or false labor. Toward the end of pregnancy (32-34 weeks), these contractions can develop more often and may become more forceful. This is not true labor because these contractions do not result in opening (dilatation) and thinning of  the cervix. They are sometimes difficult to tell apart from true labor because these contractions can be forceful and people have different pain tolerances. You should not feel embarrassed if you go to the hospital with false labor. Sometimes, the only way to tell if you are in true labor is for your health care provider to look for changes in the cervix. °If there are no prenatal problems or other health problems associated with the pregnancy, it is completely safe to be sent home with false labor and await the onset of true labor. °HOW CAN YOU TELL THE DIFFERENCE BETWEEN TRUE AND FALSE LABOR? °False Labor °· The contractions of false labor are usually shorter and not as hard as those of true labor.   °· The contractions are usually irregular.   °· The contractions are often felt in the front of the lower abdomen and in the groin.   °· The contractions may go away when you walk around or change positions while lying down.   °· The contractions get weaker and are shorter lasting as time goes on.   °· The contractions do not usually become progressively stronger, regular, and closer together as with true labor.   °True Labor °· Contractions in true   labor last 30-70 seconds, become very regular, usually become more intense, and increase in frequency.   °· The contractions do not go away with walking.   °· The discomfort is usually felt in the top of the uterus and spreads to the lower abdomen and low back.   °· True labor can be determined by your health care provider with an exam. This will show that the cervix is dilating and getting thinner.   °WHAT TO REMEMBER °· Keep up with your usual exercises and follow other instructions given by your health care provider.   °· Take medicines as directed by your health care provider.   °· Keep your regular prenatal appointments.   °· Eat and drink lightly if you think you are going into labor.   °· If Braxton Hicks contractions are making you uncomfortable:   °¨ Change your  position from lying down or resting to walking, or from walking to resting.   °¨ Sit and rest in a tub of warm water.   °¨ Drink 2-3 glasses of water. Dehydration may cause these contractions.   °¨ Do slow and deep breathing several times an hour.   °WHEN SHOULD I SEEK IMMEDIATE MEDICAL CARE? °Seek immediate medical care if: °· Your contractions become stronger, more regular, and closer together.   °· You have fluid leaking or gushing from your vagina.   °· You have a fever.   °· You pass blood-tinged mucus.   °· You have vaginal bleeding.   °· You have continuous abdominal pain.   °· You have low back pain that you never had before.   °· You feel your baby's head pushing down and causing pelvic pressure.   °· Your baby is not moving as much as it used to.   °  °This information is not intended to replace advice given to you by your health care provider. Make sure you discuss any questions you have with your health care provider. °  °Document Released: 10/31/2005 Document Revised: 11/05/2013 Document Reviewed: 08/12/2013 °Elsevier Interactive Patient Education ©2016 [employer] ° °

## 2016-09-08 ENCOUNTER — Encounter (HOSPITAL_COMMUNITY): Payer: Self-pay

## 2016-09-08 ENCOUNTER — Inpatient Hospital Stay (HOSPITAL_COMMUNITY)
Admission: AD | Admit: 2016-09-08 | Discharge: 2016-09-10 | DRG: 775 | Disposition: A | Discharge status: Home / Self Care | Payer: Medicaid Other | Source: Ambulatory Visit | Attending: Obstetrics & Gynecology | Admitting: Obstetrics & Gynecology

## 2016-09-08 ENCOUNTER — Inpatient Hospital Stay (HOSPITAL_COMMUNITY): Payer: Medicaid Other | Admitting: Anesthesiology

## 2016-09-08 DIAGNOSIS — O4202 Full-term premature rupture of membranes, onset of labor within 24 hours of rupture: Principal | ICD-10-CM | POA: Diagnosis present

## 2016-09-08 DIAGNOSIS — Z3A39 39 weeks gestation of pregnancy: Secondary | ICD-10-CM

## 2016-09-08 DIAGNOSIS — Z8249 Family history of ischemic heart disease and other diseases of the circulatory system: Secondary | ICD-10-CM | POA: Diagnosis not present

## 2016-09-08 DIAGNOSIS — Z87891 Personal history of nicotine dependence: Secondary | ICD-10-CM

## 2016-09-08 DIAGNOSIS — Z6841 Body Mass Index (BMI) 40.0 and over, adult: Secondary | ICD-10-CM

## 2016-09-08 DIAGNOSIS — O99214 Obesity complicating childbirth: Secondary | ICD-10-CM | POA: Diagnosis present

## 2016-09-08 LAB — TYPE AND SCREEN
ABO/RH(D): A POS
ANTIBODY SCREEN: NEGATIVE

## 2016-09-08 LAB — CBC
HEMATOCRIT: 32.3 % — AB (ref 36.0–46.0)
HEMOGLOBIN: 11.3 g/dL — AB (ref 12.0–15.0)
MCH: 28.9 pg (ref 26.0–34.0)
MCHC: 35 g/dL (ref 30.0–36.0)
MCV: 82.6 fL (ref 78.0–100.0)
Platelets: 258 10*3/uL (ref 150–400)
RBC: 3.91 MIL/uL (ref 3.87–5.11)
RDW: 13.8 % (ref 11.5–15.5)
WBC: 6.7 10*3/uL (ref 4.0–10.5)

## 2016-09-08 LAB — RAPID URINE DRUG SCREEN, HOSP PERFORMED
AMPHETAMINES: NOT DETECTED
Barbiturates: NOT DETECTED
Benzodiazepines: NOT DETECTED
COCAINE: NOT DETECTED
OPIATES: NOT DETECTED
Tetrahydrocannabinol: POSITIVE — AB

## 2016-09-08 LAB — GROUP B STREP BY PCR: Group B strep by PCR: NEGATIVE

## 2016-09-08 LAB — POCT FERN TEST: POCT Fern Test: POSITIVE

## 2016-09-08 LAB — HEPATITIS B SURFACE ANTIGEN: HEP B S AG: NEGATIVE

## 2016-09-08 MED ORDER — PHENYLEPHRINE 40 MCG/ML (10ML) SYRINGE FOR IV PUSH (FOR BLOOD PRESSURE SUPPORT)
80.0000 ug | PREFILLED_SYRINGE | INTRAVENOUS | Status: DC | PRN
  Filled 2016-09-08: qty 5

## 2016-09-08 MED ORDER — LACTATED RINGERS IV SOLN: 500.0000 mL | INTRAVENOUS | Status: DC | PRN

## 2016-09-08 MED ORDER — FENTANYL 2.5 MCG/ML BUPIVACAINE 1/10 % EPIDURAL INFUSION (WH - ANES)
14.0000 mL/h | INTRAMUSCULAR | Status: DC | PRN
  Administered 2016-09-08 (×2): 14 mL/h via EPIDURAL
  Filled 2016-09-08 (×2): qty 125

## 2016-09-08 MED ORDER — IBUPROFEN 600 MG PO TABS
600.0000 mg | ORAL_TABLET | Freq: Four times a day (QID) | ORAL | Status: DC
  Administered 2016-09-08 – 2016-09-10 (×6): 600 mg via ORAL
  Filled 2016-09-08 (×6): qty 1

## 2016-09-08 MED ORDER — ONDANSETRON HCL 4 MG/2ML IJ SOLN: 4.0000 mg | INTRAMUSCULAR | Status: DC | PRN

## 2016-09-08 MED ORDER — ZOLPIDEM TARTRATE 5 MG PO TABS: 5.0000 mg | ORAL_TABLET | Freq: Every evening | ORAL | Status: DC | PRN

## 2016-09-08 MED ORDER — ONDANSETRON HCL 4 MG PO TABS: 4.0000 mg | ORAL_TABLET | ORAL | Status: DC | PRN

## 2016-09-08 MED ORDER — SODIUM CHLORIDE 0.9% FLUSH: 3.0000 mL | Freq: Two times a day (BID) | INTRAVENOUS | Status: DC

## 2016-09-08 MED ORDER — WITCH HAZEL-GLYCERIN EX PADS: 1.0000 "application " | MEDICATED_PAD | CUTANEOUS | Status: DC | PRN

## 2016-09-08 MED ORDER — BENZOCAINE-MENTHOL 20-0.5 % EX AERO
1.0000 "application " | INHALATION_SPRAY | CUTANEOUS | Status: DC | PRN
  Administered 2016-09-08: 1 via TOPICAL
  Filled 2016-09-08: qty 56

## 2016-09-08 MED ORDER — SOD CITRATE-CITRIC ACID 500-334 MG/5ML PO SOLN
30.0000 mL | ORAL | Status: DC | PRN
  Administered 2016-09-08: 30 mL via ORAL
  Filled 2016-09-08: qty 15

## 2016-09-08 MED ORDER — EPHEDRINE 5 MG/ML INJ
10.0000 mg | INTRAVENOUS | Status: DC | PRN
Start: 2016-09-08 — End: 2016-09-08

## 2016-09-08 MED ORDER — SODIUM CHLORIDE 0.9 % IV SOLN: 250.0000 mL | INTRAVENOUS | Status: DC | PRN

## 2016-09-08 MED ORDER — OXYTOCIN 40 UNITS IN LACTATED RINGERS INFUSION - SIMPLE MED: 2.5000 [IU]/h | INTRAVENOUS | Status: DC

## 2016-09-08 MED ORDER — LIDOCAINE HCL (PF) 1 % IJ SOLN
30.0000 mL | INTRAMUSCULAR | Status: DC | PRN
  Filled 2016-09-08: qty 30

## 2016-09-08 MED ORDER — DIPHENHYDRAMINE HCL 25 MG PO CAPS: 25.0000 mg | ORAL_CAPSULE | Freq: Four times a day (QID) | ORAL | Status: DC | PRN

## 2016-09-08 MED ORDER — DIBUCAINE 1 % RE OINT: 1.0000 "application " | TOPICAL_OINTMENT | RECTAL | Status: DC | PRN

## 2016-09-08 MED ORDER — OXYCODONE-ACETAMINOPHEN 5-325 MG PO TABS: 1.0000 | ORAL_TABLET | ORAL | Status: DC | PRN

## 2016-09-08 MED ORDER — OXYCODONE-ACETAMINOPHEN 5-325 MG PO TABS: 2.0000 | ORAL_TABLET | ORAL | Status: DC | PRN

## 2016-09-08 MED ORDER — OXYTOCIN 40 UNITS IN LACTATED RINGERS INFUSION - SIMPLE MED
1.0000 m[IU]/min | INTRAVENOUS | Status: DC
  Administered 2016-09-08: 2 m[IU]/min via INTRAVENOUS
  Filled 2016-09-08: qty 1000

## 2016-09-08 MED ORDER — LACTATED RINGERS IV SOLN
INTRAVENOUS | Status: DC
  Administered 2016-09-08: 500 mL via INTRAUTERINE

## 2016-09-08 MED ORDER — ONDANSETRON HCL 4 MG/2ML IJ SOLN: 4.0000 mg | Freq: Four times a day (QID) | INTRAMUSCULAR | Status: DC | PRN

## 2016-09-08 MED ORDER — GUAIFENESIN-DM 100-10 MG/5ML PO SYRP
5.0000 mL | ORAL_SOLUTION | ORAL | Status: DC | PRN
  Administered 2016-09-08: 5 mL via ORAL
  Filled 2016-09-08 (×2): qty 5

## 2016-09-08 MED ORDER — TETANUS-DIPHTH-ACELL PERTUSSIS 5-2.5-18.5 LF-MCG/0.5 IM SUSP: 0.5000 mL | Freq: Once | INTRAMUSCULAR | Status: DC

## 2016-09-08 MED ORDER — EPHEDRINE 5 MG/ML INJ
10.0000 mg | INTRAVENOUS | Status: DC | PRN
Start: 2016-09-08 — End: 2016-09-08
  Filled 2016-09-08: qty 4

## 2016-09-08 MED ORDER — SENNOSIDES-DOCUSATE SODIUM 8.6-50 MG PO TABS
2.0000 | ORAL_TABLET | ORAL | Status: DC
  Administered 2016-09-08 – 2016-09-09 (×2): 2 via ORAL
  Filled 2016-09-08 (×2): qty 2

## 2016-09-08 MED ORDER — LACTATED RINGERS IV SOLN: 500.0000 mL | Freq: Once | INTRAVENOUS | Status: DC

## 2016-09-08 MED ORDER — COCONUT OIL OIL: 1.0000 "application " | TOPICAL_OIL | Status: DC | PRN

## 2016-09-08 MED ORDER — LACTATED RINGERS IV SOLN
INTRAVENOUS | Status: DC
  Administered 2016-09-08 (×2): via INTRAVENOUS

## 2016-09-08 MED ORDER — DIPHENHYDRAMINE HCL 50 MG/ML IJ SOLN
12.5000 mg | INTRAMUSCULAR | Status: DC | PRN
  Administered 2016-09-08: 12.5 mg via INTRAVENOUS
  Filled 2016-09-08: qty 1

## 2016-09-08 MED ORDER — SODIUM CHLORIDE 0.9% FLUSH: 3.0000 mL | INTRAVENOUS | Status: DC | PRN

## 2016-09-08 MED ORDER — EPHEDRINE 5 MG/ML INJ
10.0000 mg | INTRAVENOUS | Status: DC | PRN
  Filled 2016-09-08: qty 4

## 2016-09-08 MED ORDER — FLEET ENEMA 7-19 GM/118ML RE ENEM: 1.0000 | ENEMA | RECTAL | Status: DC | PRN

## 2016-09-08 MED ORDER — OXYCODONE HCL 5 MG PO TABS
5.0000 mg | ORAL_TABLET | Freq: Four times a day (QID) | ORAL | Status: DC | PRN
  Administered 2016-09-08 – 2016-09-10 (×3): 5 mg via ORAL
  Filled 2016-09-08 (×3): qty 1

## 2016-09-08 MED ORDER — OXYTOCIN 40 UNITS IN LACTATED RINGERS INFUSION - SIMPLE MED: 2.5000 [IU]/h | INTRAVENOUS | Status: DC | PRN

## 2016-09-08 MED ORDER — ACETAMINOPHEN 325 MG PO TABS
650.0000 mg | ORAL_TABLET | ORAL | Status: DC | PRN
  Administered 2016-09-09 – 2016-09-10 (×2): 650 mg via ORAL
  Filled 2016-09-08 (×2): qty 2

## 2016-09-08 MED ORDER — PHENYLEPHRINE 40 MCG/ML (10ML) SYRINGE FOR IV PUSH (FOR BLOOD PRESSURE SUPPORT): 80.0000 ug | PREFILLED_SYRINGE | INTRAVENOUS | Status: DC | PRN

## 2016-09-08 MED ORDER — TERBUTALINE SULFATE 1 MG/ML IJ SOLN
0.2500 mg | Freq: Once | INTRAMUSCULAR | Status: AC | PRN
  Administered 2016-09-08: 0.25 mg via SUBCUTANEOUS
  Filled 2016-09-08: qty 1

## 2016-09-08 MED ORDER — ACETAMINOPHEN 325 MG PO TABS: 650.0000 mg | ORAL_TABLET | ORAL | Status: DC | PRN

## 2016-09-08 MED ORDER — SIMETHICONE 80 MG PO CHEW: 80.0000 mg | CHEWABLE_TABLET | ORAL | Status: DC | PRN

## 2016-09-08 MED ORDER — PRENATAL MULTIVITAMIN CH
1.0000 | ORAL_TABLET | Freq: Every day | ORAL | Status: DC
  Administered 2016-09-09: 1 via ORAL
  Filled 2016-09-08: qty 1

## 2016-09-08 MED ORDER — OXYTOCIN BOLUS FROM INFUSION
500.0000 mL | Freq: Once | INTRAVENOUS | Status: AC
  Administered 2016-09-08: 500 mL via INTRAVENOUS

## 2016-09-08 MED ORDER — FENTANYL CITRATE (PF) 100 MCG/2ML IJ SOLN
100.0000 ug | INTRAMUSCULAR | Status: DC | PRN
  Administered 2016-09-08: 100 ug via INTRAVENOUS
  Filled 2016-09-08: qty 2

## 2016-09-08 MED ORDER — PHENYLEPHRINE 40 MCG/ML (10ML) SYRINGE FOR IV PUSH (FOR BLOOD PRESSURE SUPPORT)
80.0000 ug | PREFILLED_SYRINGE | INTRAVENOUS | Status: DC | PRN
  Filled 2016-09-08: qty 10
  Filled 2016-09-08: qty 5

## 2016-09-08 MED ORDER — LIDOCAINE HCL (PF) 1 % IJ SOLN
INTRAMUSCULAR | Status: DC | PRN
  Administered 2016-09-08: 3 mL
  Administered 2016-09-08: 5 mL
  Administered 2016-09-08: 2 mL

## 2016-09-08 NOTE — Anesthesia Procedure Notes (Signed)
Epidural Patient location during procedure: OB  Staffing Anesthesiologist: Marcene DuosFITZGERALD, Bryanda Mikel Performed: anesthesiologist   Preanesthetic Checklist Completed: patient identified, site marked, surgical consent, pre-op evaluation, timeout performed, IV checked, risks and benefits discussed and monitors and equipment checked  Epidural Patient position: sitting Prep: site prepped and draped and DuraPrep Patient monitoring: continuous pulse ox and blood pressure Approach: midline Location: L4-L5 Injection technique: LOR saline  Needle:  Needle type: Tuohy  Needle gauge: 17 G Needle length: 9 cm and 9 Needle insertion depth: 9 cm Catheter type: closed end flexible Catheter size: 19 Gauge Catheter at skin depth: 15 (14cm initially at the skin. Advanced to 15 when laid in lat decub.) cm Test dose: negative  Assessment Events: blood not aspirated, injection not painful, no injection resistance, negative IV test and no paresthesia

## 2016-09-08 NOTE — Progress Notes (Signed)
Patient ID: Rose Holden, female   DOB: 06/22/1991, 25 y.o.   MRN: [id number]  S: Patient seen & examined for progress of labor and repetitive variable decelerations. Patient very comfortable with epidural.  O:  Vitals:   09/08/16 1440 09/08/16 1450 09/08/16 1502 09/08/16 1525  BP:   (!) 130/55   Pulse:   99   Resp: 20 18 20 18   Temp:      TempSrc:      SpO2:      Weight:      Height:        Dilation: 10 Dilation Complete Date: 09/08/16 Dilation Complete Time: 1502 Effacement (%): 100 Station: 0, +1 Presentation: Vertex Exam by:: Dr. Omer JackMumaw  FHT: 145, mod var, variable decelerations nadir to 85, return to baseline after about 1 min.  TOCO: q2-4 min, difficult to trace 2/2 body habitus  Trial pushes with patient did not show progress with station after pushing (barely +1 station during pushes). Laboring down to recover baby. IUPC and FSE placed without difficulty.  A/P:  Pitocin turned off 1 dose of terbutaline given IUPC placed, amnioinfusion started FSE placed Will reevaluate

## 2016-09-08 NOTE — Progress Notes (Signed)
Please call MD for fever >101.

## 2016-09-08 NOTE — Anesthesia Preprocedure Evaluation (Signed)
Anesthesia Evaluation  Patient identified by MRN, date of birth, ID band  Reviewed: Allergy & Precautions, Patient's Chart, lab work & pertinent test results  Airway Mallampati: II       Dental   Pulmonary former smoker,    breath sounds clear to auscultation       Cardiovascular  Rhythm:Regular Rate:Normal     Neuro/Psych    GI/Hepatic   Endo/Other  Morbid obesity  Renal/GU      Musculoskeletal   Abdominal   Peds  Hematology   Anesthesia Other Findings   Reproductive/Obstetrics (+) Pregnancy                             Lab Results  Component Value Date   WBC 6.7 09/08/2016   HGB 11.3 (L) 09/08/2016   HCT 32.3 (L) 09/08/2016   MCV 82.6 09/08/2016   PLT 258 09/08/2016    Anesthesia Physical Anesthesia Plan  ASA: III  Anesthesia Plan: Epidural   Post-op Pain Management:    Induction:   Airway Management Planned: Natural Airway  Additional Equipment:   Intra-op Plan:   Post-operative Plan:   Informed Consent: I have reviewed the patients History and Physical, chart, labs and discussed the procedure including the risks, benefits and alternatives for the proposed anesthesia with the patient or authorized representative who has indicated his/her understanding and acceptance.     Plan Discussed with:   Anesthesia Plan Comments:         Anesthesia Quick Evaluation

## 2016-09-08 NOTE — Progress Notes (Signed)
[facility] to have IV moved to area besides antecubital. Explained to patient that that area is not ideal for labor dur to frequent occlusions and patient verbalized understanding. At 0530 patient states she would like to try new location for Iv. After 2 unsuccessful attempts in the left wrist and right forearm by Alfonzo BeersMolly Azalia Neuberger, RN and Alesia BandaLeanne, RN respectively patient declines having further IV attempts and LR infusion replaced on original Iv.

## 2016-09-08 NOTE — MAU Note (Signed)
Pt arrives via EMS stating water broke at 0100-clear. Cervix was closed yesterday. Denies vag bleeding. [place], however has not had PNC in about 2 months. +FM

## 2016-09-08 NOTE — H&P (Signed)
Rose Holden is a 25 y.o. female [phone] [id number] @ 39.3wks presenting for leaking fluid since 0100. Denies bldg; reports irreg ctx. [facility], and then [place] with no care x 2 mos.  OB History    Gravida Para Term Preterm AB Living   5 3 3   1 3    SAB TAB Ectopic Multiple Live Births     1     3     Past Medical History:  Diagnosis Date  . Depression   . Medical history non-contributory   . Nausea & vomiting 02/17/2016  . Pollen allergies 02/17/2016  . Pregnant 02/17/2016  . Vaginal discharge 02/17/2016   Past Surgical History:  Procedure Laterality Date  . broken leg     closed reduction  . NO PAST SURGERIES     Family History: family history includes Cancer in her maternal grandmother; Hypertension in her mother. Social History:  reports that she has quit smoking. Her smoking use included Cigarettes. She smoked 0.00 packs per day for 11.00 years. She has never used smokeless tobacco. She reports that she uses drugs, including Marijuana. She reports that she does not drink alcohol.     Maternal Diabetes: No Genetic Screening: unknown Maternal Ultrasounds/Referrals: Normal Fetal Ultrasounds or other Referrals:  None Maternal Substance Abuse:  Yes:  Type: Marijuana Significant Maternal Medications:  None Significant Maternal Lab Results:  Lab values include: Group B Strep negative Other Comments:  no PNC since August  ROS History Dilation: 2 Effacement (%): 70 Exam by:: Lynnette Weston RN Blood pressure 134/71, pulse 80, temperature 98.1 F (36.7 C), temperature source Oral, resp. rate 16, height 5\' 2"  (1.575 m), weight 102.1 kg (225 lb), last menstrual period 12/16/2015, SpO2 99 %. Exam Physical Exam  Constitutional: She is oriented to person, place, and time. She appears well-developed.  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  Cardiovascular: Normal rate.    Respiratory: Effort normal.  GI:  EFM 140s, +accels, no decels Irreg, mild ctx  Musculoskeletal: Normal range of motion.  Neurological: She is alert and oriented to person, place, and time.  Skin: Skin is warm and dry.  Psychiatric: She has a normal mood and affect. Her behavior is normal. Thought content normal.    Prenatal labs: ABO, Rh: --/--/A POS (10/26 0241) Antibody: NEG (10/26 0241) Rubella:   RPR: Non Reactive (12/11 1323)  HBsAg:    HIV: Non Reactive (12/11 1323)  GBS:   neg 10/26  Assessment/Plan: IUP@39 .3wks PROM GBS PCR neg  -[facility] -Expectant management initially, but now desires augmentation w/ Pit -Anticipate SVD -Will have SW see PP   Nancy Arvin CNM 09/08/2016, 5:04 AM

## 2016-09-08 NOTE — Progress Notes (Signed)
Oral Temp 101.9. Pulse=122. CNM,Frances Cresenzo notified. Order to repeat oral temp in 1 hour received.

## 2016-09-09 LAB — CBC
HEMATOCRIT: 31.2 % — AB (ref 36.0–46.0)
Hemoglobin: 10.6 g/dL — ABNORMAL LOW (ref 12.0–15.0)
MCH: 29.1 pg (ref 26.0–34.0)
MCHC: 34 g/dL (ref 30.0–36.0)
MCV: 85.7 fL (ref 78.0–100.0)
Platelets: 221 10*3/uL (ref 150–400)
RBC: 3.64 MIL/uL — ABNORMAL LOW (ref 3.87–5.11)
RDW: 13.9 % (ref 11.5–15.5)
WBC: 11.2 10*3/uL — AB (ref 4.0–10.5)

## 2016-09-09 LAB — RPR: RPR Ser Ql: NONREACTIVE

## 2016-09-09 NOTE — Progress Notes (Signed)
Post Partum Day 1 Subjective: no complaints, up ad lib, voiding and tolerating PO, small lochia, plans to bottle feed, bilateral tubal ligation ([facility])  Objective: Blood pressure (!) 100/54, pulse 77, temperature 97.8 F (36.6 C), temperature source Oral, resp. rate 16, height 5\' 2"  (1.575 m), weight 102.1 kg (225 lb), last menstrual period 12/16/2015, SpO2 100 %, unknown if currently breastfeeding.  Physical Exam:  General: alert, cooperative and no distress Lochia:normal flow Chest: CTAB Heart: RRR no m/r/g Abdomen: +BS, soft, nontender,  Uterine Fundus: firm DVT Evaluation: No evidence of DVT seen on physical exam. Extremities: no edema   Recent Labs  09/08/16 0241 09/09/16 0518  HGB 11.3* 10.6*  HCT 32.3* 31.2*    Assessment/Plan: Plan for discharge tomorrow and Contraception BTL--will sign papers next week when comes by for circ   LOS: 1 day   [name],Rose Holden 09/09/2016, 7:53 AM

## 2016-09-09 NOTE — Progress Notes (Signed)
CSW called [place] [facility] to inquire about report made earlier today.  CSW was informed that report was not accepted.  CSW will monitor umbilical cord tissue results and make report if warranted.

## 2016-09-09 NOTE — Progress Notes (Signed)
CLINICAL SOCIAL WORK MATERNAL/CHILD NOTE  Patient Details  Name: Rose Holden MRN: [id number] Date of Birth: 09/08/2018  Date:  09/09/2016  Clinical Social Worker Initiating Note:  Dhani Dannemiller E. Brigitte Pulse, [place] Date/ Time Initiated:  09/09/16/1200     Child's Name:  Rose Holden   Legal Guardian:  Mother (Earnestene Stitzer.  MOB did not want to disclose FOB's name.)   Need for Interpreter:  None   Date of Referral:  09/09/16     Reason for Referral:  Current Substance Use/Substance Use During Pregnancy , Other (Comment) (Limited PNC, and MOB positive for THC on admission.  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   Education:      Museum/gallery curator Resources:  Medicaid   Other Resources:      Cultural/Religious Considerations Which May Impact Care: None stated. MOB's facesheet notes religion as [place].  Strengths:  Ability to meet basic needs , Pediatrician chosen , Home prepared for child  (Pediatric follow up will be at [facility])   Risk Factors/Current Problems:  Mental Health Concerns , Substance Use    Cognitive State:  Alert    Mood/Affect:  Flat    CSW Assessment: CSW met with MOB in her first floor room/132 to offer support and complete assessment due to limited [facility] and marijuana use in pregnancy.  MOB was very quiet and CSW found her to be difficult to engage, however, pleasant during the visit.   MOB states she and baby  are doing well.  She reports that this is her fourth child and that her 3 other children are being cared for by her mother and aunt while she is in the hospital.  She reports FOB is not involved, but "will be eventually."  She did not wish to disclose his name and states she has not notified him of baby's birth.   MOB states she has all necessary supplies for infant at home and that she is aware of SIDS precautions.   CSW inquired about her prenatal care hx.  MOB states she started care at [facility] and then moved to [place] and started going to [facility].  She states when she moved back to [place] in August, [facility] would not take her back as a patient due to her gestation.  CSW informed MOB of hospital drug screen policy and asked if she has any concerns.  MOB did not have any questions or concerns.  CSW asked if there has been any substance use in pregnancy and MOB denied.  CSW asked MOB if she is aware that she was positive for [facility] on admission.  MOB was very quiet during conversation of this topic.  CSW informed MOB that CSW is concerned about her wellbeing and the wellbeing of her children and is asking questions free of judgement, but rather to provide resources if needed and to inform her of what to expect given that  she was positive for an illegal substance at delivery.  MOB states she only smoked marijuana during the last two weeks of pregnancy to help her eat.  She states she does not smoke on a regular basis.  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  CSW notes documentation in MOB's chart that she presented to the emergency room in May 2017 admitting to drinking and in the custody of [facility].  CSW is concerned about this, but did not press the issue, since MOB has denied all other substance use.  CSW informed MOB of report that CSW will make to [place]  since she was positive for an illegal substance on admission to the hospital to deliver.  MOB looked to be annoyed, but remained quiet.  CSW informed MOB that baby's UDS is negative and cord tissue drug screen is pending.  CSW asked MOB if she has hx with CPS and she replied, "not really."   CSW inquired about how MOB is feeling emotionally, as depression is documented in her chart.  CSW also notes hx of behavioral health admission in October of 2015 for suicide attempt.  MOB states, "I'm fine and I've been fine."  She reports one instance of depression, but did not discuss.  She states she felt it was situational and was not during any of her postpartum time periods.  CSW provided education regarding PMADs.  MOB was minimally attentive and states she feels comfortable talking with her OB at [facility] if she has concerns.   CSW made report to Child Protective Services and asked that a worker contact CSW to inform if the report was accepted and if a worker plans to meet with MOB prior to discharge.    CSW Plan/Description:  Child Copy Report , Patient/Family Education     Alphonzo Cruise, [place] 09/09/2016, 1:38 PM

## 2016-09-09 NOTE — Anesthesia Postprocedure Evaluation (Signed)
Anesthesia Post Note  Patient: Rose Holden  Procedure(s) Performed: * No procedures listed *  Patient location during evaluation: Mother Baby Anesthesia Type: Epidural Level of consciousness: awake and alert and oriented Pain management: satisfactory to patient Vital Signs Assessment: post-procedure vital signs reviewed and stable Respiratory status: spontaneous breathing and nonlabored ventilation Cardiovascular status: stable Postop Assessment: no headache, no backache, no signs of nausea or vomiting, adequate PO intake and patient able to bend at knees (patient up walking) Anesthetic complications: no     Last Vitals:  Vitals:   09/09/16 0550 09/09/16 0712  BP: 140/65 (!) 100/54  Pulse: 79 77  Resp: 18 16  Temp: 36.8 C 36.6 C    Last Pain:  Vitals:   09/09/16 0712  TempSrc: Oral  PainSc:    Pain Goal: Patients Stated Pain Goal: 2 (09/08/16 0842)               Madison HickmanGREGORY,Chenae Brager

## 2016-09-09 NOTE — Progress Notes (Signed)
Patient ID: Rose Holden, female   DOB: 08/18/1991, 25 y.o.   MRN: [id number]  POSTPARTUM PROGRESS NOTE  Post Partum Day 1 Subjective:  Rose Holden is a 25 y.o. [id number] 3748w3d s/p SVD.  No acute events overnight.  Pt denies problems with ambulating, voiding or po intake.  She denies nausea or vomiting.  Pain is well controlled.  She has had flatus. She has not had bowel movement.  Lochia Minimal.   Objective: Blood pressure (!) 100/54, pulse 77, temperature 97.8 F (36.6 C), temperature source Oral, resp. rate 16, height 5\' 2"  (1.575 m), weight 102.1 kg (225 lb), last menstrual period 12/16/2015, SpO2 100 %, unknown if currently breastfeeding.  Physical Exam:  General: alert, cooperative and no distress Lochia:normal flow Chest: CTAB Heart: RRR no m/r/g Abdomen: +BS, soft, nontender,  Uterine Fundus: firm, below level of umbilicus DVT Evaluation: No calf swelling or tenderness Extremities: no edema   Recent Labs  09/08/16 0241 09/09/16 0518  HGB 11.3* 10.6*  HCT 32.3* 31.2*    Assessment/Plan:  ASSESSMENT: Rose Holden is a 25 y.o. [id number] 4448w3d s/p SVD  Plan for discharge tomorrow   LOS: 1 day   Tillman Sersngela C Riccio, DO 09/09/2016, 7:36 AM   I have seen and examined this patient and agree the above assessment.  Respiratory effort normal, lochia appropriate, legs negative,  pain level normal.  [name],[name] 09/20/2016 5:07 PM

## 2016-09-10 MED ORDER — IBUPROFEN 600 MG PO TABS: 600.0000 mg | ORAL_TABLET | Freq: Four times a day (QID) | ORAL | 0 refills | Status: DC

## 2016-09-10 MED ORDER — SENNOSIDES-DOCUSATE SODIUM 8.6-50 MG PO TABS: 2.0000 | ORAL_TABLET | Freq: Every day | ORAL | 1 refills | Status: DC

## 2016-09-10 NOTE — Discharge Summary (Signed)
OB Discharge Summary     Patient Name: Rose Holden DOB: 06/26/1991 MRN: [id number]  Date of admission: 09/08/2016 Delivering MD: Jen MowMUMAW, [facility]   Date of discharge: 09/10/2016  Admitting diagnosis: PROM  Intrauterine pregnancy: 5645w3d     Secondary diagnosis:  Active Problems:   Indication for care in labor or delivery  Additional problems: none     Discharge diagnosis: Term Pregnancy Delivered                                                                                                Post partum procedures:none  Augmentation: Pitocin  Complications: None  Hospital course:  Onset of Labor With Vaginal Delivery     25 y.o. yo [id number] at 4745w3d was admitted in Active Labor on 09/08/2016. Patient had an uncomplicated labor course as follows:  Membrane Rupture Time/Date: 1:00 AM ,09/08/2016   Intrapartum Procedures: Episiotomy: None [1]                                         Lacerations:  None [1]  Patient had a delivery of a Viable infant. 09/08/2016  Information for the patient's newborn:  Rose Holden, Boy Rose Holden [[id number]][[id number]]  Delivery Method: Vaginal, Spontaneous Delivery (Filed from Delivery Summary)    Pateint had an uncomplicated postpartum course.  She is ambulating, tolerating a regular diet, passing flatus, and urinating well. Patient is discharged home in stable condition on 09/10/16.    Physical exam  Vitals:   09/09/16 0550 09/09/16 0712 09/09/16 1830 09/10/16 0500  BP: 140/65 (!) 100/54 135/86 129/89  Pulse: 79 77 79 88  Resp: 18 16 18 20   Temp: 98.3 F (36.8 C) 97.8 F (36.6 C) 98.2 F (36.8 C) 97.5 F (36.4 C)  TempSrc: Oral Oral Oral   SpO2:      Weight:      Height:       General: alert, cooperative and no distress Lochia: appropriate Uterine Fundus: firm Incision: N/A DVT Evaluation: No evidence of DVT seen on physical exam. Labs: Lab Results  Component Value Date   WBC 11.2 (H) 09/09/2016   HGB 10.6 (L) 09/09/2016   HCT 31.2  (L) 09/09/2016   MCV 85.7 09/09/2016   PLT 221 09/09/2016   CMP Latest Ref Rng & Units 07/23/2016  Glucose 65 - 99 mg/dL 130(Q100(H)  BUN 6 - 20 mg/dL 6  Creatinine 6.570.44 - 8.461.00 mg/dL 9.620.57  Sodium 952135 - 841145 mmol/L 136  Potassium 3.5 - 5.1 mmol/L 3.7  Chloride 101 - 111 mmol/L 106  CO2 22 - 32 mmol/L 19(L)  Calcium 8.9 - 10.3 mg/dL 9.2  Total Protein 6.5 - 8.1 g/dL 6.1(L)  Total Bilirubin 0.3 - 1.2 mg/dL 3.2(G0.2(L)  Alkaline Phos 38 - 126 U/L 66  AST 15 - 41 U/L 17  ALT 14 - 54 U/L 16    Discharge instruction: per After Visit Summary and "Baby and Me Booklet".  After visit meds:    Medication List  TAKE these medications   calcium carbonate 500 MG chewable tablet Commonly known as:  TUMS - dosed in mg elemental calcium Chew 2 tablets by mouth as needed for indigestion or heartburn.   ibuprofen 600 MG tablet Commonly known as:  ADVIL,MOTRIN Take 1 tablet (600 mg total) by mouth every 6 (six) hours.   NATACHEW 28-1 MG Chew Chew 1 tablet by mouth daily.   senna-docusate 8.6-50 MG tablet Commonly known as:  Senokot-S Take 2 tablets by mouth at bedtime.       Diet: routine diet  Activity: Advance as tolerated. Pelvic rest for 6 weeks.   Outpatient follow up:6 weeks Follow up Appt:No future appointments. Follow up Visit:No Follow-up on file.  Postpartum contraception: Tubal Ligation  Newborn Data: Live born female  Birth Weight: 7 lb 10.9 oz (3485 g) APGAR: 8, 9  Baby Feeding: Bottle Disposition:home with mother   09/10/2016 Rose SersAngela C Riccio, DO   CNM attestation I have seen and examined this patient and agree with above documentation in the resident's note.   Rose Holden is a 25 y.o. [id number] s/p SVD.   Pain is well controlled.  Plan for birth control is bilateral tubal ligation- interval.  Method of Feeding: bottle  PE:  BP 129/89 (BP Location: Left Arm)   Pulse 88   Temp 97.5 F (36.4 C)   Resp 20   Ht 5\' 2"  (1.575 m)   Wt 102.1 kg (225 lb)   LMP  12/16/2015   SpO2 100%   Breastfeeding? Unknown   BMI 41.15 kg/m  Fundus firm   Recent Labs  09/08/16 0241 09/09/16 0518  HGB 11.3* 10.6*  HCT 32.3* 31.2*     Plan: discharge today - postpartum care discussed - f/u clinic in 6 weeks for postpartum visit; stop by FT sooner to sign BTL papers   Cam Holden, Rose Holden, CNM 9:39 AM  09/10/2016

## 2016-09-10 NOTE — Discharge Instructions (Signed)

## 2016-09-13 LAB — HIV ANTIBODY (ROUTINE TESTING W REFLEX): HIV Screen 4th Generation wRfx: NONREACTIVE

## 2016-09-14 LAB — RUBELLA SCREEN: RUBELLA: 11.1 {index} (ref >0.99)

## 2016-09-14 NOTE — Progress Notes (Signed)
Received call from lab that there was a correction lab result  on Rose Holden. I called the clinic [phone] left message for RN about the information lab gave me that the recent Rubella result of 11.10 instead of 10.20. Patient has been discharged already. I called house coverage Larita FifeLynn and she suggested to call MD office with result since patient has been dc'd. I am charge today so that is why they contacted me. But left message for RN with result.

## 2016-10-19 ENCOUNTER — Ambulatory Visit: Payer: Medicaid Other | Admitting: Advanced Practice Midwife

## 2016-10-27 ENCOUNTER — Ambulatory Visit: Payer: Medicaid Other | Admitting: Advanced Practice Midwife

## 2016-11-01 ENCOUNTER — Ambulatory Visit: Payer: Medicaid Other | Admitting: Advanced Practice Midwife

## 2017-01-18 ENCOUNTER — Other Ambulatory Visit: Payer: Self-pay

## 2017-02-23 ENCOUNTER — Other Ambulatory Visit (HOSPITAL_COMMUNITY)
Admission: RE | Admit: 2017-02-23 | Discharge: 2017-02-23 | Disposition: A | Discharge status: Home / Self Care | Payer: Medicaid Other | Source: Ambulatory Visit | Attending: Adult Health | Admitting: Adult Health

## 2017-02-23 ENCOUNTER — Encounter (INDEPENDENT_AMBULATORY_CARE_PROVIDER_SITE_OTHER): Payer: Self-pay

## 2017-02-23 ENCOUNTER — Ambulatory Visit (INDEPENDENT_AMBULATORY_CARE_PROVIDER_SITE_OTHER): Payer: Medicaid Other | Admitting: Adult Health

## 2017-02-23 ENCOUNTER — Encounter: Payer: Self-pay | Admitting: Adult Health

## 2017-02-23 VITALS — BP 128/80 | HR 72 | Ht 63.0 in | Wt 233.0 lb

## 2017-02-23 DIAGNOSIS — Z3490 Encounter for supervision of normal pregnancy, unspecified, unspecified trimester: Secondary | ICD-10-CM

## 2017-02-23 DIAGNOSIS — Z01419 Encounter for gynecological examination (general) (routine) without abnormal findings: Secondary | ICD-10-CM | POA: Insufficient documentation

## 2017-02-23 DIAGNOSIS — O3680X0 Pregnancy with inconclusive fetal viability, not applicable or unspecified: Secondary | ICD-10-CM

## 2017-02-23 DIAGNOSIS — R3915 Urgency of urination: Secondary | ICD-10-CM

## 2017-02-23 DIAGNOSIS — N898 Other specified noninflammatory disorders of vagina: Secondary | ICD-10-CM

## 2017-02-23 DIAGNOSIS — Z3201 Encounter for pregnancy test, result positive: Secondary | ICD-10-CM

## 2017-02-23 DIAGNOSIS — Z0000 Encounter for general adult medical examination without abnormal findings: Secondary | ICD-10-CM

## 2017-02-23 LAB — POCT URINALYSIS DIPSTICK
Blood, UA: NEGATIVE
Glucose, UA: NEGATIVE
Ketones, UA: NEGATIVE
LEUKOCYTES UA: NEGATIVE
Nitrite, UA: NEGATIVE
PROTEIN UA: NEGATIVE

## 2017-02-23 LAB — POCT URINE PREGNANCY: PREG TEST UR: POSITIVE — AB

## 2017-02-23 MED ORDER — CITRANATAL ASSURE 35-1 & 300 MG PO MISC: 1.0000 | Freq: Every day | ORAL | 12 refills | Status: DC

## 2017-02-23 NOTE — Progress Notes (Signed)
Patient ID: Deyci D Blakesley, female   DOB: 18-Dec-1990, 26 y.o.   MRN: [id number] History of Present Illness: Marguerette is a 26 year old black female in for well woman gyn exam and pap, and complains of urinary frequency and urgency and vaginal discharge with odor and itching.She says she wants a tubal. A UPT was performed and was +.She was surprized, said she was using condoms.  Current Medications, Allergies, Past Medical History, Past Surgical History, Family History and Social History were reviewed in [employer] record.     Review of Systems:  Patient denies any headaches, hearing loss, fatigue, blurred vision, shortness of breath, chest pain, abdominal pain, problems with bowel movements, or intercourse. No joint pain or mood swings.See HPI for positives.    Physical Exam:BP 128/80 (BP Location: Left Arm, Patient Position: Sitting, Cuff Size: Large)   Pulse 72   Ht  (1.6 m)   Wt 233 lb (105.7 kg)   LMP 01/16/2017   Breastfeeding? No   BMI 41.27 kg/m UPT +, about 5+3 weeks by LMP, EDD 10/23/17. Urine dipstick: negative. General:  Well developed, well nourished, no acute distress Skin:  Warm and dry Neck:  Midline trachea, normal thyroid, good ROM, no lymphadenopathy Lungs; Clear to auscultation bilaterally Breast:  No dominant palpable mass, retraction, or nipple discharge Cardiovascular: Regular rate and rhythm Abdomen:  Soft, non tender, no hepatosplenomegaly Pelvic:  External genitalia is normal in appearance, no lesions.  The vagina is normal in appearance,scant clear discharge, no odor. Urethra has no lesions or masses. The cervix is bulbous,pap with GC/CHL performed.  Uterus is felt to be normal size, shape, and contour.  No adnexal masses or tenderness noted.Bladder is non tender, no masses felt. Extremities/musculoskeletal:  No swelling or varicosities noted, no clubbing or cyanosis Psych:  No mood changes, alert and cooperative,seems happy PHQ 2 score  0.  Impression:  1. Pap smear, as part of routine gynecological examination   2. Urinary urgency   3. Positive urine pregnancy test   4. Pregnancy, unspecified gestational age   87. Encounter to determine fetal viability of pregnancy, single or unspecified fetus   6. Vaginal discharge      Plan: Meds ordered this encounter  Medications  . Prenat w/o A-FeCbGl-DSS-FA-DHA (CITRANATAL ASSURE) 35-1 & 300 MG tablet    Sig: Take 1 tablet by mouth daily.    Dispense:  30 tablet    Refill:  12    Order Specific Question:   Supervising Provider    Answer:   Lazaro Arms [[id number]]  Return in 2 week for dating [place] Physical in 1 year, pap in 3 if normal

## 2017-03-02 ENCOUNTER — Telehealth: Payer: Self-pay | Admitting: *Deleted

## 2017-03-02 ENCOUNTER — Encounter: Payer: Self-pay | Admitting: Women's Health

## 2017-03-02 DIAGNOSIS — R87610 Atypical squamous cells of undetermined significance on cytologic smear of cervix (ASC-US): Secondary | ICD-10-CM | POA: Insufficient documentation

## 2017-03-02 LAB — CYTOLOGY - PAP
CHLAMYDIA, DNA PROBE: NEGATIVE
DIAGNOSIS: UNDETERMINED — AB
HPV (WINDOPATH): NOT DETECTED
NEISSERIA GONORRHEA: NEGATIVE

## 2017-03-02 NOTE — Telephone Encounter (Signed)
Informed patient that per Selena Batten pap showed slightly abnormal cells, but high risk HPV neg, per guidelines can repeat in 3 yrs (or next year w/ her physical if she prefers). Patient verbalized understanding.

## 2017-03-09 ENCOUNTER — Other Ambulatory Visit: Payer: Medicaid Other

## 2017-03-09 ENCOUNTER — Ambulatory Visit: Payer: Medicaid Other | Admitting: Adult Health

## 2017-03-09 ENCOUNTER — Encounter: Payer: Self-pay | Admitting: Adult Health

## 2017-04-17 ENCOUNTER — Encounter: Payer: Self-pay | Admitting: Obstetrics and Gynecology

## 2017-04-17 ENCOUNTER — Ambulatory Visit (INDEPENDENT_AMBULATORY_CARE_PROVIDER_SITE_OTHER): Payer: Medicaid Other | Admitting: Obstetrics and Gynecology

## 2017-04-17 DIAGNOSIS — Z91199 Patient's noncompliance with other medical treatment and regimen due to unspecified reason: Secondary | ICD-10-CM

## 2017-04-17 DIAGNOSIS — Z5329 Procedure and treatment not carried out because of patient's decision for other reasons: Secondary | ICD-10-CM

## 2017-06-29 ENCOUNTER — Telehealth: Payer: Self-pay | Admitting: Obstetrics and Gynecology

## 2017-06-29 NOTE — Progress Notes (Signed)
Patient did not keep this appointment, call to cancel and states that she did not have an appointment that she That day and she will call to make an appointment within she desires one

## 2017-06-29 NOTE — Telephone Encounter (Signed)
Phone call the patient 06/29/2017. Ms. Rose Holden states that she did not keep her appointment on 04/27/2017, that she called late that day and canceled the appointment today and plans to reschedule. Patient has our number and says that she'll call for rescheduling of appointment

## 2017-07-07 ENCOUNTER — Ambulatory Visit: Payer: Self-pay | Admitting: Obstetrics and Gynecology

## 2017-07-07 ENCOUNTER — Encounter: Payer: Self-pay | Admitting: Obstetrics and Gynecology

## 2017-07-10 ENCOUNTER — Encounter (HOSPITAL_COMMUNITY): Payer: Self-pay | Admitting: Adult Health

## 2017-07-10 ENCOUNTER — Emergency Department (HOSPITAL_COMMUNITY)
Admission: EM | Admit: 2017-07-10 | Discharge: 2017-07-10 | Disposition: A | Discharge status: Home / Self Care | Payer: Medicaid Other | Attending: Emergency Medicine | Admitting: Emergency Medicine

## 2017-07-10 DIAGNOSIS — Z87891 Personal history of nicotine dependence: Secondary | ICD-10-CM | POA: Insufficient documentation

## 2017-07-10 DIAGNOSIS — Z79899 Other long term (current) drug therapy: Secondary | ICD-10-CM | POA: Insufficient documentation

## 2017-07-10 DIAGNOSIS — N898 Other specified noninflammatory disorders of vagina: Secondary | ICD-10-CM | POA: Insufficient documentation

## 2017-07-10 DIAGNOSIS — J039 Acute tonsillitis, unspecified: Secondary | ICD-10-CM | POA: Insufficient documentation

## 2017-07-10 DIAGNOSIS — Z202 Contact with and (suspected) exposure to infections with a predominantly sexual mode of transmission: Secondary | ICD-10-CM | POA: Insufficient documentation

## 2017-07-10 LAB — WET PREP, GENITAL
Clue Cells Wet Prep HPF POC: NONE SEEN
SPERM: NONE SEEN
Trich, Wet Prep: NONE SEEN
YEAST WET PREP: NONE SEEN

## 2017-07-10 LAB — URINALYSIS, ROUTINE W REFLEX MICROSCOPIC
Bilirubin Urine: NEGATIVE
Glucose, UA: NEGATIVE mg/dL
HGB URINE DIPSTICK: NEGATIVE
Ketones, ur: NEGATIVE mg/dL
Leukocytes, UA: NEGATIVE
Nitrite: NEGATIVE
Protein, ur: NEGATIVE mg/dL
SPECIFIC GRAVITY, URINE: 1.025 (ref 1.005–1.030)
pH: 6 (ref 5.0–8.0)

## 2017-07-10 LAB — POC URINE PREG, ED: Preg Test, Ur: NEGATIVE

## 2017-07-10 LAB — RAPID STREP SCREEN (MED CTR MEBANE ONLY): Streptococcus, Group A Screen (Direct): NEGATIVE

## 2017-07-10 MED ORDER — CEFTRIAXONE SODIUM 250 MG IJ SOLR
250.0000 mg | Freq: Once | INTRAMUSCULAR | Status: AC
  Administered 2017-07-10: 250 mg via INTRAMUSCULAR
  Filled 2017-07-10: qty 250

## 2017-07-10 MED ORDER — AZITHROMYCIN 250 MG PO TABS
1000.0000 mg | ORAL_TABLET | Freq: Once | ORAL | Status: AC
  Administered 2017-07-10: 1000 mg via ORAL
  Filled 2017-07-10: qty 4

## 2017-07-10 MED ORDER — IBUPROFEN 800 MG PO TABS: 800.0000 mg | ORAL_TABLET | Freq: Three times a day (TID) | ORAL | 0 refills | Status: DC

## 2017-07-10 MED ORDER — STERILE WATER FOR INJECTION IJ SOLN
INTRAMUSCULAR | Status: AC
  Administered 2017-07-10: 10 mL
  Filled 2017-07-10: qty 10

## 2017-07-10 MED ORDER — MAGIC MOUTHWASH W/LIDOCAINE: 5.0000 mL | Freq: Three times a day (TID) | ORAL | 0 refills | Status: DC | PRN

## 2017-07-10 NOTE — Discharge Instructions (Signed)
Be ure to keep your GYN appt for later this week.  Return here if needed.

## 2017-07-10 NOTE — ED Triage Notes (Signed)
PResents with vaginal discharge with foul odor that is yellow began one week ago, Sunday developed sore throat. Throat injected with white exudates and enlarged tonsils. Endorses unprotected oral and vaginal sex.

## 2017-07-10 NOTE — ED Provider Notes (Signed)
[facility]-EMERGENCY DEPT Provider Note   CSN: [id number] Arrival date & time: 07/10/17  1046     History   Chief Complaint Chief Complaint  Patient presents with  . Vaginal Discharge  . Sore Throat    HPI Rose Holden is a 26 y.o. female.  HPI  Rose Holden is a 26 y.o. female who presents to the Emergency Department complaining of sore throat and nasal congestion that began one day prior and vaginal discharge with odor for one week.  She admits to recent unprotected oral and vaginal intercourse.  She has not tried any medications for symptom relief.   She denies fever, cough, shortness of breath, dysuria, vaginal or pelvic pain, vaginal bleeding and back pain.  Past Medical History:  Diagnosis Date  . Depression   . Medical history non-contributory   . Nausea & vomiting 02/17/2016  . Pollen allergies 02/17/2016  . Pregnant 02/17/2016  . Vaginal discharge 02/17/2016    Patient Active Problem List   Diagnosis Date Noted  . Atypical squamous cell changes of undetermined significance (ASCUS) on cervical cytology with negative high risk human papilloma virus (HPV) test result 03/02/2017  . Positive urine pregnancy test 02/23/2017  . Urinary urgency 02/23/2017  . Indication for care in labor or delivery 09/08/2016  . Encounter to determine fetal viability of pregnancy 02/17/2016  . Vaginal discharge 02/17/2016  . Nausea & vomiting 02/17/2016  . Pollen allergies 02/17/2016  . MDD (major depressive disorder), single episode 08/15/2014  . Supervision of other normal pregnancy 06/13/2013  . Trichomonal vaginitis in pregnancy 03/03/2013  . 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 Prenatal care insufficient 02/28/2013    Past Surgical History:  Procedure Laterality Date  . broken leg     closed reduction  . NO PAST SURGERIES      OB History    Gravida Para Term Preterm AB Living   6 4 4   1 4    SAB TAB Ectopic Multiple Live Births     1   0 4        Home Medications    Prior to Admission medications   Medication Sig Start Date End Date Taking? Authorizing Provider  acetaminophen (TYLENOL) 500 MG tablet Take 500 mg by mouth every 6 (six) hours as needed.   Yes [provider]  Prenat w/o A-FeCbGl-DSS-FA-DHA (CITRANATAL ASSURE) 35-1 & 300 MG tablet Take 1 tablet by mouth daily. Patient not taking: Reported on 07/10/2017 02/23/17   Adline Potter, NP    Family History Family History  Problem Relation Age of Onset  . Hypertension Mother   . Glaucoma Mother   . Cancer Maternal Grandmother     Social History Social History  Substance Use Topics  . Smoking status: Former Smoker    Packs/day: 0.00    Years: 11.00    Types: Cigarettes  . Smokeless tobacco: Never Used     Comment: smokes 10 cig daily  . Alcohol use Yes     Comment: occ     Allergies   Patient has no known allergies.   Review of Systems Review of Systems  Constitutional: Negative for activity change, appetite change, chills and fever.  HENT: Positive for congestion and sore throat. Negative for ear pain, facial swelling, trouble swallowing and voice change.   Respiratory: Negative for cough and shortness of breath.   Cardiovascular: Negative for chest pain.  Gastrointestinal: Negative for abdominal pain, nausea and vomiting.  Genitourinary: Positive  for vaginal discharge. Negative for difficulty urinating, dysuria, flank pain, genital sores, pelvic pain and vaginal bleeding.  Musculoskeletal: Negative for arthralgias, neck pain and neck stiffness.  Skin: Negative for color change and rash.  Neurological: Negative for dizziness, facial asymmetry, speech difficulty, numbness and headaches.  Hematological: Negative for adenopathy.  All other systems reviewed and are negative.    Physical Exam Updated Vital Signs BP 128/83   Pulse 83   Temp 98.1 F (36.7 C) (Oral)   Resp 18   Ht 5\' 2"  (1.575 m)   Wt 99.8 kg (220 lb)   LMP  07/02/2017 (Exact Date)   SpO2 98%   Breastfeeding? No   BMI 40.24 kg/m   Physical Exam  Constitutional: She is oriented to person, place, and time. She appears well-developed and well-nourished. No distress.  HENT:  Head: Atraumatic.  Right Ear: Tympanic membrane and ear canal normal.  Left Ear: Tympanic membrane and ear canal normal.  Mouth/Throat: Uvula is midline and mucous membranes are normal. Posterior oropharyngeal erythema present. No oropharyngeal exudate, posterior oropharyngeal edema or tonsillar abscesses. Tonsils are 1+ on the right. Tonsils are 1+ on the left. Tonsillar exudate.  Neck: Normal range of motion. No JVD present.  Cardiovascular: Normal rate, regular rhythm and intact distal pulses.   No murmur heard. Pulmonary/Chest: Effort normal and breath sounds normal. No respiratory distress.  Abdominal: Soft. She exhibits no distension and no mass. There is no tenderness. There is no guarding.  Genitourinary: Uterus normal. There is no lesion on the right labia. There is no lesion on the left labia. Cervix exhibits motion tenderness. Cervix exhibits no discharge and no friability. Right adnexum displays no mass and no tenderness. Left adnexum displays no mass and no tenderness. No tenderness or bleeding in the vagina. No foreign body in the vagina. Vaginal discharge found.  Genitourinary Comments: Pelvic exam assisted by nursing.  Small amt of white vaginal discharge.  No adnexal masses or tenderness, mild CMT.    Musculoskeletal: Normal range of motion.  Lymphadenopathy:    She has no cervical adenopathy.  Neurological: She is oriented to person, place, and time. No sensory deficit.  Skin: Skin is warm. No rash noted.  Psychiatric: She has a normal mood and affect.  Nursing note and vitals reviewed.    ED Treatments / Results  Labs (all labs ordered are listed, but only abnormal results are displayed) Labs Reviewed  WET PREP, GENITAL - Abnormal; Notable for the  following:       Result Value   WBC, Wet Prep HPF POC MODERATE (*)    All other components within normal limits  URINALYSIS, ROUTINE W REFLEX MICROSCOPIC - Abnormal; Notable for the following:    APPearance HAZY (*)    All other components within normal limits  RAPID STREP SCREEN (NOT AT [facility])  CULTURE, GROUP A STREP ([facility])  POC URINE PREG, ED  GC/CHLAMYDIA PROBE AMP ([place]) NOT AT [facility]    EKG  EKG Interpretation None       Radiology No results found.  Procedures Procedures (including critical care time)  Medications Ordered in ED Medications - No data to display   Initial Impression / Assessment and Plan / ED Course  I have reviewed the triage vital signs and the nursing notes.  Pertinent labs & imaging results that were available during my care of the patient were reviewed by me and considered in my medical decision making (see chart for details).     Pt  well appearing.  abd is soft, NT.   Vaginal d/c with mild CMT, will tx with IM Rocephin and po zithromax, sore throat likely tonsillitis.  Pt stable for d/c.  Return precautions discussed.    Final Clinical Impressions(s) / ED Diagnoses   Final diagnoses:  Tonsillitis  Possible exposure to STD    New Prescriptions New Prescriptions   No medications on file     Pauline Aus, Cordelia Poche 07/12/17 2233    Vanetta Mulders, MD 07/19/17 1640

## 2017-07-11 LAB — GC/CHLAMYDIA PROBE AMP (~~LOC~~) NOT AT ARMC
CHLAMYDIA, DNA PROBE: NEGATIVE
NEISSERIA GONORRHEA: NEGATIVE

## 2017-07-12 LAB — CULTURE, GROUP A STREP (THRC)

## 2017-11-20 ENCOUNTER — Ambulatory Visit: Payer: Self-pay | Admitting: Obstetrics and Gynecology

## 2017-11-30 ENCOUNTER — Encounter: Payer: Self-pay | Admitting: Obstetrics and Gynecology

## 2017-11-30 ENCOUNTER — Ambulatory Visit: Payer: Self-pay | Admitting: Obstetrics and Gynecology

## 2017-12-07 ENCOUNTER — Encounter (HOSPITAL_COMMUNITY): Payer: Self-pay | Admitting: Emergency Medicine

## 2017-12-07 ENCOUNTER — Emergency Department (HOSPITAL_COMMUNITY)
Admission: EM | Admit: 2017-12-07 | Discharge: 2017-12-07 | Disposition: A | Discharge status: Home / Self Care | Payer: Self-pay | Attending: Emergency Medicine | Admitting: Emergency Medicine

## 2017-12-07 ENCOUNTER — Other Ambulatory Visit: Payer: Self-pay

## 2017-12-07 DIAGNOSIS — Z79899 Other long term (current) drug therapy: Secondary | ICD-10-CM | POA: Insufficient documentation

## 2017-12-07 DIAGNOSIS — Z87891 Personal history of nicotine dependence: Secondary | ICD-10-CM | POA: Insufficient documentation

## 2017-12-07 DIAGNOSIS — K047 Periapical abscess without sinus: Secondary | ICD-10-CM

## 2017-12-07 DIAGNOSIS — A599 Trichomoniasis, unspecified: Secondary | ICD-10-CM

## 2017-12-07 LAB — URINALYSIS, ROUTINE W REFLEX MICROSCOPIC
BILIRUBIN URINE: NEGATIVE
GLUCOSE, UA: NEGATIVE mg/dL
HGB URINE DIPSTICK: NEGATIVE
KETONES UR: NEGATIVE mg/dL
NITRITE: NEGATIVE
PH: 5 (ref 5.0–8.0)
Protein, ur: 30 mg/dL — AB
SPECIFIC GRAVITY, URINE: 1.031 — AB (ref 1.005–1.030)

## 2017-12-07 LAB — WET PREP, GENITAL
Clue Cells Wet Prep HPF POC: NONE SEEN
SPERM: NONE SEEN
Yeast Wet Prep HPF POC: NONE SEEN

## 2017-12-07 LAB — PREGNANCY, URINE: Preg Test, Ur: NEGATIVE

## 2017-12-07 MED ORDER — CEFTRIAXONE SODIUM 250 MG IJ SOLR
250.0000 mg | Freq: Once | INTRAMUSCULAR | Status: AC
  Administered 2017-12-07: 250 mg via INTRAMUSCULAR
  Filled 2017-12-07: qty 250

## 2017-12-07 MED ORDER — AZITHROMYCIN 250 MG PO TABS
1000.0000 mg | ORAL_TABLET | Freq: Once | ORAL | Status: AC
  Administered 2017-12-07: 1000 mg via ORAL
  Filled 2017-12-07: qty 4

## 2017-12-07 MED ORDER — METRONIDAZOLE 500 MG PO TABS: 500.0000 mg | ORAL_TABLET | Freq: Two times a day (BID) | ORAL | 0 refills | Status: DC

## 2017-12-07 MED ORDER — AMOXICILLIN 500 MG PO CAPS: 500.0000 mg | ORAL_CAPSULE | Freq: Three times a day (TID) | ORAL | 0 refills | Status: AC

## 2017-12-07 MED ORDER — LIDOCAINE HCL (PF) 1 % IJ SOLN
INTRAMUSCULAR | Status: AC
  Administered 2017-12-07: 5 mL
  Filled 2017-12-07: qty 5

## 2017-12-07 NOTE — Discharge Instructions (Signed)
Complete your entire course of antibiotics as prescribed for your dental infection and your vaginal infection.   Avoid applying heat or ice to your mouth  which can worsen pain and swelling.  You may use warm salt water swish and spit treatment or half peroxide and water swish and spit after meals to keep this area clean as discussed.   Your gonorrhea and chlamydia cultures are pending and will be notified of either is positive, however you have been treated for both of these infections with the medication you received today.  You do have trichomonas which is a sexually transmitted disease.  Your partner should be treated for this infection as well or you will continue to have problems with this infection.  Do not drink alcohol while taking Flagyl as this will make you nauseated and vomit with this combination.

## 2017-12-07 NOTE — ED Triage Notes (Signed)
PT c/o pain to right upper dental pain x2 weeks. PT also c/o tan vaginal discharge with foul odor.

## 2017-12-08 LAB — GC/CHLAMYDIA PROBE AMP (~~LOC~~) NOT AT ARMC
Chlamydia: NEGATIVE
Neisseria Gonorrhea: NEGATIVE

## 2017-12-08 NOTE — ED Provider Notes (Signed)
[facility] EMERGENCY DEPARTMENT Provider Note   CSN: [id number] Arrival date & time: 12/07/17  1407     History   Chief Complaint Chief Complaint  Patient presents with  . Dental Pain  . Vaginal Discharge    HPI Rose Holden is a 27 y.o. female with 2 complaints, the first being dental pain.  She has a fractured tooth which has been present for a long time, but reports pain in the tooth and surrounding gingiva for the past 2 weeks.  She has had no drainage from the tooth, but does endorse gingival swelling.  She does have a dentist but not current dental insurance but is hoping to be able to arrange dental care within the next 1-2 weeks.  She denies fevers or chills and has had no difficulty with throat pain or swallowing.  She has taken Tylenol without relief of symptoms.  Additionally reports having a watery tan colored vaginal discharge along with foul odor which is been present for the past week.  She denies abdominal or pelvic pain, dysuria, also denies fevers or chills, nausea or vomiting and has no back or flank pain.  She is sexually active with one partner, to her knowledge her partner is having no symptoms suggesting STDs.  HPI  Past Medical History:  Diagnosis Date  . Depression   . Medical history non-contributory   . Nausea & vomiting 02/17/2016  . Pollen allergies 02/17/2016  . Pregnant 02/17/2016  . Vaginal discharge 02/17/2016    Patient Active Problem List   Diagnosis Date Noted  . Atypical squamous cell changes of undetermined significance (ASCUS) on cervical cytology with negative high risk human papilloma virus (HPV) test result 03/02/2017  . Positive urine pregnancy test 02/23/2017  . Urinary urgency 02/23/2017  . Indication for care in labor or delivery 09/08/2016  . Encounter to determine fetal viability of pregnancy 02/17/2016  . Vaginal discharge 02/17/2016  . Nausea & vomiting 02/17/2016  . Pollen allergies 02/17/2016  . MDD (major depressive  disorder), single episode 08/15/2014  . Supervision of other normal pregnancy 06/13/2013  . Trichomonal vaginitis in pregnancy 03/03/2013  . 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 Prenatal care insufficient 02/28/2013    Past Surgical History:  Procedure Laterality Date  . broken leg     closed reduction  . NO PAST SURGERIES      OB History    Gravida Para Term Preterm AB Living   6 4 4   1 4    SAB TAB Ectopic Multiple Live Births     1   0 4       Home Medications    Prior to Admission medications   Medication Sig Start Date End Date Taking? Authorizing Provider  acetaminophen (TYLENOL) 500 MG tablet Take 500 mg by mouth every 6 (six) hours as needed.    [provider]  amoxicillin (AMOXIL) 500 MG capsule Take 1 capsule (500 mg total) by mouth 3 (three) times daily for 10 days. 12/07/17 12/17/17  Burgess Amor, PA-C  ibuprofen (ADVIL,MOTRIN) 800 MG tablet Take 1 tablet (800 mg total) by mouth 3 (three) times daily. 07/10/17   Triplett, Tammy, PA-C  magic mouthwash w/lidocaine SOLN Take 5 mLs by mouth 3 (three) times daily as needed for mouth pain. Swish and spit, do not swallow 07/10/17   Triplett, Tammy, PA-C  metroNIDAZOLE (FLAGYL) 500 MG tablet Take 1 tablet (500 mg total) by mouth 2 (two) times daily. 12/07/17  Burgess AmorIdol, Giavonni Cizek, PA-C  Prenat w/o A-FeCbGl-DSS-FA-DHA (CITRANATAL ASSURE) 35-1 & 300 MG tablet Take 1 tablet by mouth daily. Patient not taking: Reported on 07/10/2017 02/23/17   Adline PotterGriffin, Jennifer A, NP    Family History Family History  Problem Relation Age of Onset  . Hypertension Mother   . Glaucoma Mother   . Cancer Maternal Grandmother     Social History Social History   Tobacco Use  . Smoking status: Former Smoker    Packs/day: 0.50    Years: 11.00    Pack years: 5.50    Types: Cigarettes  . Smokeless tobacco: Never Used  . Tobacco comment: smokes 10 cig daily  Substance Use Topics  . Alcohol use: Yes    Comment: occ  .  Drug use: Yes    Types: Marijuana    Comment: Pt denies     Allergies   Patient has no known allergies.   Review of Systems Review of Systems  Constitutional: Negative for fever.  HENT: Positive for dental problem. Negative for congestion and sore throat.   Eyes: Negative.   Respiratory: Negative for chest tightness and shortness of breath.   Cardiovascular: Negative for chest pain.  Gastrointestinal: Negative for abdominal pain, nausea and vomiting.  Genitourinary: Positive for vaginal discharge. Negative for dysuria, flank pain and pelvic pain.  Musculoskeletal: Negative for arthralgias, joint swelling and neck pain.  Skin: Negative.  Negative for rash and wound.  Neurological: Negative for dizziness, weakness, light-headedness, numbness and headaches.  Psychiatric/Behavioral: Negative.      Physical Exam Updated Vital Signs BP 118/72 (BP Location: Left Arm)   Pulse 82   Temp 98 F (36.7 C) (Oral)   Resp 17   Ht 5\' 2"  (1.575 m)   Wt 99.8 kg (220 lb)   LMP 11/16/2017   SpO2 100%   BMI 40.24 kg/m   Physical Exam  Constitutional: She is oriented to person, place, and time. She appears well-developed and well-nourished. No distress.  HENT:  Head: Normocephalic and atraumatic.  Right Ear: Tympanic membrane and external ear normal.  Left Ear: Tympanic membrane and external ear normal.  Mouth/Throat: Oropharynx is clear and moist and mucous membranes are normal. No oral lesions. No trismus in the jaw. No dental abscesses.    Eyes: Conjunctivae are normal.  Neck: Normal range of motion. Neck supple.  Cardiovascular: Normal rate, regular rhythm, normal heart sounds and intact distal pulses.  Pulmonary/Chest: Effort normal and breath sounds normal. She has no wheezes.  Abdominal: Soft. Bowel sounds are normal. She exhibits no distension and no mass. There is no tenderness. There is no guarding.  Genitourinary: Uterus is not tender. Cervix exhibits no motion tenderness  and no discharge. Right adnexum displays no mass, no tenderness and no fullness. Left adnexum displays no mass, no tenderness and no fullness. Vaginal discharge found.  Musculoskeletal: Normal range of motion.  Lymphadenopathy:    She has no cervical adenopathy.  Neurological: She is alert and oriented to person, place, and time.  Skin: Skin is warm and dry. No erythema.  Psychiatric: She has a normal mood and affect.  Nursing note and vitals reviewed.    ED Treatments / Results  Labs (all labs ordered are listed, but only abnormal results are displayed) Labs Reviewed  WET PREP, GENITAL - Abnormal; Notable for the following components:      Result Value   Trich, Wet Prep PRESENT (*)    WBC, Wet Prep HPF POC TOO NUMEROUS TO COUNT (*)  All other components within normal limits  URINALYSIS, ROUTINE W REFLEX MICROSCOPIC - Abnormal; Notable for the following components:   APPearance CLOUDY (*)    Specific Gravity, Urine 1.031 (*)    Protein, ur 30 (*)    Leukocytes, UA LARGE (*)    Bacteria, UA RARE (*)    Squamous Epithelial / LPF 6-30 (*)    All other components within normal limits  URINE CULTURE  PREGNANCY, URINE  GC/CHLAMYDIA PROBE AMP ([place]) NOT AT [facility]    EKG  EKG Interpretation None       Radiology No results found.  Procedures Procedures (including critical care time)  Medications Ordered in ED Medications  cefTRIAXone (ROCEPHIN) injection 250 mg (250 mg Intramuscular Given 12/07/17 1856)  azithromycin (ZITHROMAX) tablet 1,000 mg (1,000 mg Oral Given 12/07/17 1855)  lidocaine (PF) (XYLOCAINE) 1 % injection (5 mLs  Given 12/07/17 1858)     Initial Impression / Assessment and Plan / ED Course  I have reviewed the triage vital signs and the nursing notes.  Pertinent labs & imaging results that were available during my care of the patient were reviewed by me and considered in my medical decision making (see chart for details).     Labs reviewed and  discussed with patient.  She was desirous of being prophylactically treated for gonorrhea and chlamydia which was completed today.  Pt aware that gonorrhea and Chlamydia cultures are currently pending.  She was placed on Flagyl and amoxicillin to cover her dental infection as well.  Discussed that she will need to discuss this diagnosis with her partner as he will need to be treated for trichomonas as well.  Patient understands and agrees with plan.  Final Clinical Impressions(s) / ED Diagnoses   Final diagnoses:  Trichomonas infection  Dental infection    ED Discharge Orders        Ordered    metroNIDAZOLE (FLAGYL) 500 MG tablet  2 times daily     12/07/17 1916    amoxicillin (AMOXIL) 500 MG capsule  3 times daily     12/07/17 1916       Victoriano Lain 12/08/17 1610    Long, Arlyss Repress, MD 12/08/17 [phone]

## 2017-12-09 LAB — URINE CULTURE: Special Requests: NORMAL

## 2018-03-19 ENCOUNTER — Emergency Department (HOSPITAL_COMMUNITY): Payer: Self-pay

## 2018-03-19 ENCOUNTER — Other Ambulatory Visit: Payer: Self-pay

## 2018-03-19 ENCOUNTER — Encounter (HOSPITAL_COMMUNITY): Payer: Self-pay | Admitting: Emergency Medicine

## 2018-03-19 ENCOUNTER — Emergency Department (HOSPITAL_COMMUNITY)
Admission: EM | Admit: 2018-03-19 | Discharge: 2018-03-19 | Disposition: A | Discharge status: Home / Self Care | Payer: Self-pay | Attending: Emergency Medicine | Admitting: Emergency Medicine

## 2018-03-19 DIAGNOSIS — Y999 Unspecified external cause status: Secondary | ICD-10-CM | POA: Insufficient documentation

## 2018-03-19 DIAGNOSIS — R3915 Urgency of urination: Secondary | ICD-10-CM | POA: Insufficient documentation

## 2018-03-19 DIAGNOSIS — Y939 Activity, unspecified: Secondary | ICD-10-CM | POA: Insufficient documentation

## 2018-03-19 DIAGNOSIS — X58XXXA Exposure to other specified factors, initial encounter: Secondary | ICD-10-CM | POA: Insufficient documentation

## 2018-03-19 DIAGNOSIS — R05 Cough: Secondary | ICD-10-CM | POA: Insufficient documentation

## 2018-03-19 DIAGNOSIS — S39012A Strain of muscle, fascia and tendon of lower back, initial encounter: Secondary | ICD-10-CM | POA: Insufficient documentation

## 2018-03-19 DIAGNOSIS — Y929 Unspecified place or not applicable: Secondary | ICD-10-CM | POA: Insufficient documentation

## 2018-03-19 DIAGNOSIS — R109 Unspecified abdominal pain: Secondary | ICD-10-CM | POA: Insufficient documentation

## 2018-03-19 DIAGNOSIS — F17210 Nicotine dependence, cigarettes, uncomplicated: Secondary | ICD-10-CM | POA: Insufficient documentation

## 2018-03-19 LAB — URINALYSIS, ROUTINE W REFLEX MICROSCOPIC
Bilirubin Urine: NEGATIVE
Glucose, UA: NEGATIVE mg/dL
Hgb urine dipstick: NEGATIVE
Ketones, ur: NEGATIVE mg/dL
LEUKOCYTES UA: NEGATIVE
NITRITE: NEGATIVE
PROTEIN: NEGATIVE mg/dL
SPECIFIC GRAVITY, URINE: 1.027 (ref 1.005–1.030)
pH: 5 (ref 5.0–8.0)

## 2018-03-19 LAB — POC URINE PREG, ED: PREG TEST UR: NEGATIVE

## 2018-03-19 MED ORDER — CYCLOBENZAPRINE HCL 10 MG PO TABS
10.0000 mg | ORAL_TABLET | Freq: Once | ORAL | Status: AC
Start: 2018-03-19 — End: 2018-03-19
  Administered 2018-03-19: 10 mg via ORAL
  Filled 2018-03-19: qty 1

## 2018-03-19 MED ORDER — CYCLOBENZAPRINE HCL 10 MG PO TABS: 10.0000 mg | ORAL_TABLET | Freq: Three times a day (TID) | ORAL | 0 refills | Status: DC

## 2018-03-19 MED ORDER — TRAMADOL HCL 50 MG PO TABS
100.0000 mg | ORAL_TABLET | Freq: Once | ORAL | Status: AC
  Administered 2018-03-19: 100 mg via ORAL
  Filled 2018-03-19: qty 2

## 2018-03-19 MED ORDER — IBUPROFEN 600 MG PO TABS: 600.0000 mg | ORAL_TABLET | Freq: Four times a day (QID) | ORAL | 0 refills | Status: DC

## 2018-03-19 NOTE — Discharge Instructions (Signed)
Your vital signs within normal limits.  Your urine pregnancy test is negative.  Your urine is negative for infection or signs of kidney stone.  Your chest x-ray is negative for any pneumonia or other lung related problem.  I suspect that you have a muscle strain involving your back and flank area.  Please use a heating pad to your back.  Please use Flexeril 3 times daily.  This medication may cause drowsiness, please do not drive a vehicle, operate machinery, drink alcohol, or participate in activities requiring concentration when taking this medication.  Use ibuprofen with breakfast, lunch, dinner, and at bedtime.

## 2018-03-19 NOTE — ED Provider Notes (Signed)
[facility] EMERGENCY DEPARTMENT Provider Note   CSN: [id number] Arrival date & time: 03/19/18  1720     History   Chief Complaint Chief Complaint  Patient presents with  . Back Pain    HPI Rose Holden is a 27 y.o. female.  The history is provided by the patient.  Back Pain   This is a new problem. The current episode started more than 2 days ago. The problem occurs daily. The problem has been gradually worsening. The pain is associated with no known injury. The pain is present in the lumbar spine. Radiates to: right lower abd. The pain is at a severity of 8/10. The symptoms are aggravated by bending and twisting. The pain is the same all the time. Associated symptoms include abdominal pain. Pertinent negatives include no chest pain, no fever, no bowel incontinence, no perianal numbness, no bladder incontinence and no dysuria. Associated symptoms comments: Cough, urine urgency. She has tried nothing for the symptoms.    Past Medical History:  Diagnosis Date  . Depression   . Medical history non-contributory   . Nausea & vomiting 02/17/2016  . Pollen allergies 02/17/2016  . Pregnant 02/17/2016  . Vaginal discharge 02/17/2016    Patient Active Problem List   Diagnosis Date Noted  . Atypical squamous cell changes of undetermined significance (ASCUS) on cervical cytology with negative high risk human papilloma virus (HPV) test result 03/02/2017  . Positive urine pregnancy test 02/23/2017  . Urinary urgency 02/23/2017  . Indication for care in labor or delivery 09/08/2016  . Encounter to determine fetal viability of pregnancy 02/17/2016  . Vaginal discharge 02/17/2016  . Nausea & vomiting 02/17/2016  . Pollen allergies 02/17/2016  . MDD (major depressive disorder), single episode 08/15/2014  . Supervision of other normal pregnancy 06/13/2013  . Trichomonal vaginitis in pregnancy 03/03/2013  . 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 Prenatal care  insufficient 02/28/2013    Past Surgical History:  Procedure Laterality Date  . broken leg     closed reduction  . NO PAST SURGERIES       OB History    Gravida  6   Para  4   Term  4   Preterm      AB  1   Living  4     SAB      TAB  1   Ectopic      Multiple  0   Live Births  4            Home Medications    Prior to Admission medications   Medication Sig Start Date End Date Taking? Authorizing Provider  acetaminophen (TYLENOL) 500 MG tablet Take 500 mg by mouth every 6 (six) hours as needed.    [provider]  ibuprofen (ADVIL,MOTRIN) 800 MG tablet Take 1 tablet (800 mg total) by mouth 3 (three) times daily. 07/10/17   Triplett, Tammy, PA-C  magic mouthwash w/lidocaine SOLN Take 5 mLs by mouth 3 (three) times daily as needed for mouth pain. Swish and spit, do not swallow 07/10/17   Triplett, Tammy, PA-C  metroNIDAZOLE (FLAGYL) 500 MG tablet Take 1 tablet (500 mg total) by mouth 2 (two) times daily. 12/07/17   Burgess Amor, PA-C  Prenat w/o A-FeCbGl-DSS-FA-DHA (CITRANATAL ASSURE) 35-1 & 300 MG tablet Take 1 tablet by mouth daily. Patient not taking: Reported on 07/10/2017 02/23/17   Adline Potter, NP    Family History Family History  Problem Relation  Age of Onset  . Hypertension Mother   . Glaucoma Mother   . Cancer Maternal Grandmother     Social History Social History   Tobacco Use  . Smoking status: Current Every Day Smoker    Packs/day: 0.50    Years: 11.00    Pack years: 5.50    Types: Cigarettes  . Smokeless tobacco: Never Used  . Tobacco comment: smokes 10 cig daily  Substance Use Topics  . Alcohol use: Yes    Comment: occ  . Drug use: Yes    Types: Marijuana     Allergies   Patient has no known allergies.   Review of Systems Review of Systems  Constitutional: Negative for activity change and fever.       All ROS Neg except as noted in HPI  HENT: Negative for nosebleeds.   Eyes: Negative for photophobia and  discharge.  Respiratory: Negative for cough, shortness of breath and wheezing.   Cardiovascular: Negative for chest pain and palpitations.  Gastrointestinal: Positive for abdominal pain. Negative for blood in stool and bowel incontinence.  Genitourinary: Positive for frequency and vaginal discharge. Negative for bladder incontinence, dysuria, hematuria and vaginal bleeding.  Musculoskeletal: Positive for back pain. Negative for arthralgias and neck pain.  Skin: Negative.   Neurological: Negative for dizziness, seizures and speech difficulty.  Psychiatric/Behavioral: Negative for confusion and hallucinations.     Physical Exam Updated Vital Signs BP 113/68 (BP Location: Right Arm)   Pulse 93   Temp 98 F (36.7 C) (Oral)   Resp 18   Ht  (1.575 m)   Wt 103.4 kg (228 lb)   SpO2 98%   BMI 41.70 kg/m   Physical Exam  Constitutional: She is oriented to person, place, and time. She appears well-developed and well-nourished.  Non-toxic appearance.  HENT:  Head: Normocephalic.  Right Ear: Tympanic membrane and external ear normal.  Left Ear: Tympanic membrane and external ear normal.  Eyes: Pupils are equal, round, and reactive to light. EOM and lids are normal.  Neck: Normal range of motion. Neck supple. Carotid bruit is not present.  Cardiovascular: Normal rate, regular rhythm, normal heart sounds, intact distal pulses and normal pulses.  Pulmonary/Chest: Breath sounds normal. No respiratory distress.  Abdominal: Soft. Bowel sounds are normal. There is no tenderness. There is no guarding.  Musculoskeletal: Normal range of motion.       Lumbar back: She exhibits pain and spasm.       Back:  Lymphadenopathy:       Head (right side): No submandibular adenopathy present.       Head (left side): No submandibular adenopathy present.    She has no cervical adenopathy.  Neurological: She is alert and oriented to person, place, and time. She has normal strength. No cranial nerve  deficit or sensory deficit.  Skin: Skin is warm and dry.  Psychiatric: She has a normal mood and affect. Her speech is normal.  Nursing note and vitals reviewed.    ED Treatments / Results  Labs (all labs ordered are listed, but only abnormal results are displayed) Labs Reviewed  URINALYSIS, ROUTINE W REFLEX MICROSCOPIC    EKG None  Radiology No results found.  Procedures Procedures (including critical care time)  Medications Ordered in ED Medications - No data to display   Initial Impression / Assessment and Plan / ED Course  I have reviewed the triage vital signs and the nursing notes.  Pertinent labs & imaging results that were available  during my care of the patient were reviewed by me and considered in my medical decision making (see chart for details).       Final Clinical Impressions(s) / ED Diagnoses MDM  Vital signs within normal limits.  The patient has pain from the right lower back extending into the right lower abdomen area.  Will check for pregnancy, for urinary tract infection, for kidney stone, for pneumonia, and also for possible muscle strain involving the back and flank area.  Patient is ambulatory, but with some discomfort.  Urine pregnancy test is negative.  Urinalysis shows a hazy yellow specimen with a specific gravity 1.027.  The nitrates and leukocyte esterase are both negative.  No hemoglobin noted on the dipstick.  Chest x-ray shows no active cardiopulmonary disease.  Prescription for Flexeril and ibuprofen given to the patient.  Patient will use heating pad to the back, and rest the back is much as possible.  Of asked patient to see her primary physician or return to the emergency department if any changes in condition, problems, or concerns.   Final diagnoses:  Strain of lumbar region, initial encounter    ED Discharge Orders    None       Ivery Quale, PA-C 03/19/18 2010    Terrilee Files, MD 03/20/18 [phone]

## 2018-03-19 NOTE — ED Triage Notes (Signed)
Pt c/o of right lower back pain x 3 days with difficulty and pain during urination.

## 2018-05-30 ENCOUNTER — Other Ambulatory Visit: Payer: Self-pay | Admitting: Adult Health

## 2018-06-20 ENCOUNTER — Other Ambulatory Visit: Payer: Self-pay

## 2018-06-20 ENCOUNTER — Emergency Department (HOSPITAL_COMMUNITY)
Admission: EM | Admit: 2018-06-20 | Discharge: 2018-06-20 | Disposition: A | Discharge status: Home / Self Care | Payer: Medicaid Other | Attending: Emergency Medicine | Admitting: Emergency Medicine

## 2018-06-20 ENCOUNTER — Encounter (HOSPITAL_COMMUNITY): Payer: Self-pay | Admitting: Emergency Medicine

## 2018-06-20 DIAGNOSIS — B9689 Other specified bacterial agents as the cause of diseases classified elsewhere: Secondary | ICD-10-CM

## 2018-06-20 DIAGNOSIS — L02415 Cutaneous abscess of right lower limb: Secondary | ICD-10-CM | POA: Diagnosis not present

## 2018-06-20 DIAGNOSIS — B373 Candidiasis of vulva and vagina: Secondary | ICD-10-CM | POA: Diagnosis not present

## 2018-06-20 DIAGNOSIS — N760 Acute vaginitis: Secondary | ICD-10-CM | POA: Insufficient documentation

## 2018-06-20 DIAGNOSIS — B3731 Acute candidiasis of vulva and vagina: Secondary | ICD-10-CM

## 2018-06-20 LAB — WET PREP, GENITAL
Sperm: NONE SEEN
Trich, Wet Prep: NONE SEEN

## 2018-06-20 LAB — URINALYSIS, ROUTINE W REFLEX MICROSCOPIC
BILIRUBIN URINE: NEGATIVE
Glucose, UA: NEGATIVE mg/dL
Hgb urine dipstick: NEGATIVE
Ketones, ur: NEGATIVE mg/dL
Leukocytes, UA: NEGATIVE
NITRITE: NEGATIVE
PROTEIN: NEGATIVE mg/dL
SPECIFIC GRAVITY, URINE: 1.025 (ref 1.005–1.030)
pH: 5 (ref 5.0–8.0)

## 2018-06-20 LAB — POC URINE PREG, ED: PREG TEST UR: NEGATIVE

## 2018-06-20 MED ORDER — METRONIDAZOLE 500 MG PO TABS: 500.0000 mg | ORAL_TABLET | Freq: Two times a day (BID) | ORAL | 0 refills | Status: DC

## 2018-06-20 MED ORDER — FLUCONAZOLE 200 MG PO TABS: ORAL_TABLET | ORAL | 1 refills | Status: DC

## 2018-06-20 MED ORDER — DOXYCYCLINE HYCLATE 100 MG PO CAPS: 100.0000 mg | ORAL_CAPSULE | Freq: Two times a day (BID) | ORAL | 0 refills | Status: DC

## 2018-06-20 MED ORDER — LIDOCAINE HCL (PF) 1 % IJ SOLN
INTRAMUSCULAR | Status: AC
  Filled 2018-06-20: qty 5

## 2018-06-20 NOTE — ED Notes (Signed)
Lab label would not print.

## 2018-06-20 NOTE — ED Notes (Signed)
Patient states she also wants to be checked for an STI.

## 2018-06-20 NOTE — Discharge Instructions (Addendum)
Warm water soaks or compresses to the affected area 2-3 times a day.  Take the antibiotic as directed.  Keep the area bandaged for the first few days.  Follow-up with your primary doctor or return here for any worsening symptoms such as increasing redness, swelling, or fever.

## 2018-06-20 NOTE — ED Triage Notes (Signed)
Patient c/o boil to her upper inner R thigh.

## 2018-06-21 LAB — GC/CHLAMYDIA PROBE AMP (~~LOC~~) NOT AT ARMC
Chlamydia: NEGATIVE
NEISSERIA GONORRHEA: POSITIVE — AB

## 2018-06-23 NOTE — ED Provider Notes (Signed)
[facility] EMERGENCY DEPARTMENT Provider Note   CSN: [id number] Arrival date & time: 06/20/18  1441     History   Chief Complaint Chief Complaint  Patient presents with  . Abscess    HPI Rose Holden is a 27 y.o. female.  HPI   Rose Holden is a 27 y.o. female who presents to the Emergency Department complaining of pain and area of swelling to the medial right thigh.  Symptoms have been present for several days.  She states this is a recurring problem.  She has tried warm water soaks without relief.  She denies drainage of the area.  She also reports having a vaginal discharge and requesting evaluation for possible STD.  She denies abdominal pain, fever, chills, vomiting, dysuria and abnormal vaginal bleeding. No new sexual partners   Past Medical History:  Diagnosis Date  . Depression   . Medical history non-contributory   . Nausea & vomiting 02/17/2016  . Pollen allergies 02/17/2016  . Pregnant 02/17/2016  . Vaginal discharge 02/17/2016    Patient Active Problem List   Diagnosis Date Noted  . Atypical squamous cell changes of undetermined significance (ASCUS) on cervical cytology with negative high risk human papilloma virus (HPV) test result 03/02/2017  . Positive urine pregnancy test 02/23/2017  . Urinary urgency 02/23/2017  . Indication for care in labor or delivery 09/08/2016  . Encounter to determine fetal viability of pregnancy 02/17/2016  . Vaginal discharge 02/17/2016  . Nausea & vomiting 02/17/2016  . Pollen allergies 02/17/2016  . MDD (major depressive disorder), single episode 08/15/2014  . Supervision of other normal pregnancy 06/13/2013  . Trichomonal vaginitis in pregnancy 03/03/2013  . 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 Prenatal care insufficient 02/28/2013    Past Surgical History:  Procedure Laterality Date  . broken leg     closed reduction  . NO PAST SURGERIES       OB History    Gravida  6   Para  4   Term   4   Preterm      AB  1   Living  4     SAB      TAB  1   Ectopic      Multiple  0   Live Births  4            Home Medications    Prior to Admission medications   Medication Sig Start Date End Date Taking? Authorizing Provider  acetaminophen (TYLENOL) 500 MG tablet Take 500 mg by mouth every 6 (six) hours as needed.    [provider]  cyclobenzaprine (FLEXERIL) 10 MG tablet Take 1 tablet (10 mg total) by mouth 3 (three) times daily. 03/19/18   Ivery Quale, PA-C  doxycycline (VIBRAMYCIN) 100 MG capsule Take 1 capsule (100 mg total) by mouth 2 (two) times daily. 06/20/18   Joseline Mccampbell, PA-C  fluconazole (DIFLUCAN) 200 MG tablet 1 tablet p.o. as single dose.  May be repeated in 7 days if needed 06/20/18   Stachia Slutsky, PA-C  ibuprofen (ADVIL,MOTRIN) 600 MG tablet Take 1 tablet (600 mg total) by mouth 4 (four) times daily. 03/19/18   Ivery Quale, PA-C  magic mouthwash w/lidocaine SOLN Take 5 mLs by mouth 3 (three) times daily as needed for mouth pain. Swish and spit, do not swallow 07/10/17   Carolena Fairbank, PA-C  metroNIDAZOLE (FLAGYL) 500 MG tablet Take 1 tablet (500 mg total) by mouth 2 (two) times daily.  06/20/18   Santos Sollenberger, PA-C  Prenat w/o A-FeCbGl-DSS-FA-DHA (CITRANATAL ASSURE) 35-1 & 300 MG tablet Take 1 tablet by mouth daily. Patient not taking: Reported on 07/10/2017 02/23/17   Adline PotterGriffin, Jennifer A, NP    Family History Family History  Problem Relation Age of Onset  . Hypertension Mother   . Glaucoma Mother   . Cancer Maternal Grandmother     Social History Social History   Tobacco Use  . Smoking status: Current Every Day Smoker    Packs/day: 0.50    Years: 11.00    Pack years: 5.50    Types: Cigarettes  . Smokeless tobacco: Never Used  . Tobacco comment: smokes 10 cig daily  Substance Use Topics  . Alcohol use: Yes    Comment: occ  . Drug use: Yes    Types: Marijuana     Allergies   Patient has no known  allergies.   Review of Systems Review of Systems  Constitutional: Negative for chills and fever.  Gastrointestinal: Negative for abdominal pain, nausea and vomiting.  Genitourinary: Positive for vaginal discharge. Negative for decreased urine volume, difficulty urinating, dysuria, flank pain, menstrual problem, pelvic pain and vaginal bleeding.  Musculoskeletal: Negative for arthralgias and joint swelling.  Skin: Positive for color change.       Redness pain and swelling of the right upper thigh  Hematological: Negative for adenopathy.  All other systems reviewed and are negative.    Physical Exam Updated Vital Signs BP (!) 158/98 (BP Location: Right Arm)   Pulse 79   Temp 98.5 F (36.9 C) (Oral)   Resp 18   Ht 5\' 2"  (1.575 m)   Wt 99.8 kg   LMP 05/30/2018   SpO2 100%   BMI 40.24 kg/m   Physical Exam  Constitutional: She is oriented to person, place, and time. She appears well-developed and well-nourished. No distress.  HENT:  Head: Normocephalic and atraumatic.  Cardiovascular: Normal rate, regular rhythm and intact distal pulses.  No murmur heard. Pulmonary/Chest: Effort normal and breath sounds normal. No respiratory distress.  Abdominal: Soft. She exhibits no distension and no mass. There is no tenderness. There is no guarding.  Genitourinary: Uterus normal. There is no rash or tenderness on the right labia. There is no rash or tenderness on the left labia. Cervix exhibits no motion tenderness. Right adnexum displays no mass and no tenderness. Left adnexum displays no mass and no tenderness.  Genitourinary Comments: Pelvic exam assisted by nursing staff.  No adnexal masses or tenderness.  No cervical motion tenderness.  Scant amount of milky white vaginal discharge  Neurological: She is alert and oriented to person, place, and time. She exhibits normal muscle tone. Coordination normal.  Skin: Skin is warm. Capillary refill takes less than 2 seconds. No rash noted. No  erythema.  3 cm area of fluctuance to the upper medial right thigh.  No active drainage.  Mild to moderate surrounding induration.  No clear erythema.  Nursing note and vitals reviewed.    ED Treatments / Results  Labs (all labs ordered are listed, but only abnormal results are displayed) Labs Reviewed  WET PREP, GENITAL - Abnormal; Notable for the following components:      Result Value   Yeast Wet Prep HPF POC PRESENT (*)    Clue Cells Wet Prep HPF POC PRESENT (*)    WBC, Wet Prep HPF POC FEW (*)    All other components within normal limits  URINALYSIS, ROUTINE W REFLEX MICROSCOPIC - Abnormal;  Notable for the following components:   APPearance HAZY (*)    All other components within normal limits  GC/CHLAMYDIA PROBE AMP ([place]) NOT AT [facility] - Abnormal; Notable for the following components:   Neisseria gonorrhea **POSITIVE** (*)    All other components within normal limits  POC URINE PREG, ED    EKG None  Radiology No results found.  Procedures Procedures (including critical care time)  Medications Ordered in ED Medications - No data to display   Initial Impression / Assessment and Plan / ED Course  I have reviewed the triage vital signs and the nursing notes.  Pertinent labs & imaging results that were available during my care of the patient were reviewed by me and considered in my medical decision making (see chart for details).     Patient well-appearing.  No abdominal tenderness.  GC and Chlamydia cultures are pending we will treat with doxycycline, Diflucan, and Flagyl.  Patient agrees to warm wet soaks or compresses.  Return precautions discussed.  Final Clinical Impressions(s) / ED Diagnoses   Final diagnoses:  Vaginal candidiasis  Abscess of right thigh  Bacterial vaginosis    ED Discharge Orders         Ordered    doxycycline (VIBRAMYCIN) 100 MG capsule  2 times daily     06/20/18 1645    fluconazole (DIFLUCAN) 200 MG tablet     06/20/18 1645     metroNIDAZOLE (FLAGYL) 500 MG tablet  2 times daily     06/20/18 1645           Pauline Aus, PA-C 06/23/18 1530    Mesner, Barbara Cower, MD 06/23/18 2337

## 2018-06-25 ENCOUNTER — Ambulatory Visit (INDEPENDENT_AMBULATORY_CARE_PROVIDER_SITE_OTHER): Payer: Medicaid Other | Admitting: Adult Health

## 2018-06-25 ENCOUNTER — Other Ambulatory Visit (HOSPITAL_COMMUNITY)
Admission: RE | Admit: 2018-06-25 | Discharge: 2018-06-25 | Disposition: A | Discharge status: Home / Self Care | Payer: Medicaid Other | Source: Ambulatory Visit | Attending: Adult Health | Admitting: Adult Health

## 2018-06-25 ENCOUNTER — Encounter: Payer: Self-pay | Admitting: Adult Health

## 2018-06-25 VITALS — BP 123/77 | HR 76 | Ht 62.0 in | Wt 235.5 lb

## 2018-06-25 DIAGNOSIS — Z9889 Other specified postprocedural states: Secondary | ICD-10-CM

## 2018-06-25 DIAGNOSIS — Z113 Encounter for screening for infections with a predominantly sexual mode of transmission: Secondary | ICD-10-CM | POA: Diagnosis not present

## 2018-06-25 DIAGNOSIS — Z01419 Encounter for gynecological examination (general) (routine) without abnormal findings: Secondary | ICD-10-CM

## 2018-06-25 DIAGNOSIS — A549 Gonococcal infection, unspecified: Secondary | ICD-10-CM

## 2018-06-25 DIAGNOSIS — Z309 Encounter for contraceptive management, unspecified: Secondary | ICD-10-CM

## 2018-06-25 DIAGNOSIS — Z3009 Encounter for other general counseling and advice on contraception: Secondary | ICD-10-CM

## 2018-06-25 MED ORDER — AZITHROMYCIN 500 MG PO TABS: ORAL_TABLET | ORAL | 0 refills | Status: DC

## 2018-06-25 MED ORDER — CEFTRIAXONE SODIUM 250 MG IJ SOLR
250.0000 mg | Freq: Once | INTRAMUSCULAR | Status: AC
  Administered 2018-06-25: 250 mg via INTRAMUSCULAR

## 2018-06-25 NOTE — Progress Notes (Signed)
Patient ID: Rose Holden, female   DOB: 12/11/1990, 27 y.o.   MRN: [id number] History of Present Illness: Rose Holden is a 27 year old black female in for well woman gyn exam and pap.She had ASCUS with negative HPV 02/23/17. She went to ER 8/7 for boil right in thigh, and was called and told had +GC, has not been treated and has not gotten antibiotic for boil yet. She says she uses condoms.  She has [employer].  Current Medications, Allergies, Past Medical History, Past Surgical History, Family History and Social History were reviewed in [facility] electronic medical record.     Review of Systems:  Patient denies any headaches, hearing loss, fatigue, blurred vision, shortness of breath, chest pain, abdominal pain, problems with bowel movements, urination, or intercourse. No joint pain or mood swings. Has boil right inner thigh, it is better   Physical Exam:BP 123/77 (BP Location: Left Arm, Patient Position: Sitting, Cuff Size: Large)   Pulse 76   Ht 5\' 2"  (1.575 m)   Wt 235 lb 8 oz (106.8 kg)   LMP 05/30/2018   Breastfeeding? No   BMI 43.07 kg/m  General:  Well developed, well nourished, no acute distress Skin:  Warm and dry Neck:  Midline trachea, normal thyroid, good ROM, no lymphadenopathy Lungs; Clear to auscultation bilaterally Breast:  No dominant palpable mass, retraction, or nipple discharge Cardiovascular: Regular rate and rhythm Abdomen:  Soft, non tender, no hepatosplenomegaly Pelvic:  External genitalia is normal in appearance, Has healing boil right inner thigh.  The vagina is normal in appearance. Urethra has no lesions or masses. The cervix is bulbous. Pap with reflex HPV and GC/CHL performed. Uterus is felt to be normal size, shape, and contour.  No adnexal masses or tenderness noted.Bladder is non tender, no masses felt. Extremities/musculoskeletal:  No swelling or varicosities noted, no clubbing or cyanosis Psych:  No mood changes, alert and  cooperative,seems happy PHQ 2 score 0.  Impression:   ICD-10-CM   1. Encounter for gynecological examination with Papanicolaou smear of cervix Z01.419   2. Family planning Z30.09 HIV antibody    RPR  3. Screening examination for STD (sexually transmitted disease) Z11.3 HIV antibody    RPR  4. Gonorrhea A54.9   5. History of drainage of abscess Z98.890       Plan: Check HIV and RPR Pap with reflex HPV and GC/CHL sent Meds ordered this encounter  Medications  . azithromycin (ZITHROMAX) 500 MG tablet    Sig: Take 2 po now    Dispense:  2 tablet    Refill:  0    Order Specific Question:   Supervising Provider    Answer:   Lazaro ArmsEURE, LUTHER H [[id number]]  Will give rocephin 250 mg IM in office No sex F/U for POC in 10 days Physical in 1 year,pap in 3 if normal

## 2018-06-25 NOTE — Progress Notes (Signed)
Pt received Rocephin 250 mg in right gluteal. Pt tolerated shot well. JSY

## 2018-06-27 ENCOUNTER — Encounter: Payer: Self-pay | Admitting: Adult Health

## 2018-06-27 ENCOUNTER — Telehealth: Payer: Self-pay | Admitting: Adult Health

## 2018-06-27 DIAGNOSIS — A549 Gonococcal infection, unspecified: Secondary | ICD-10-CM

## 2018-06-27 HISTORY — DX: Gonococcal infection, unspecified: A54.9

## 2018-06-27 LAB — CYTOLOGY - PAP
ADEQUACY: ABSENT
CHLAMYDIA, DNA PROBE: NEGATIVE
DIAGNOSIS: NEGATIVE
Neisseria Gonorrhea: POSITIVE — AB

## 2018-06-27 LAB — RPR: RPR: NONREACTIVE

## 2018-06-27 LAB — HIV ANTIBODY (ROUTINE TESTING W REFLEX): HIV Screen 4th Generation wRfx: NONREACTIVE

## 2018-06-27 MED ORDER — METRONIDAZOLE 500 MG PO TABS: 500.0000 mg | ORAL_TABLET | Freq: Two times a day (BID) | ORAL | 0 refills | Status: DC

## 2018-06-27 MED ORDER — AZITHROMYCIN 500 MG PO TABS: ORAL_TABLET | ORAL | 0 refills | Status: DC

## 2018-06-27 NOTE — Telephone Encounter (Signed)
Pt aware pap is negative for malignancy and +BV and GC, negative for chlamydia. [facility] with 250 mg rocephin, she has not gotten azithromycin yet, and will rx flagyl for BV

## 2018-07-05 ENCOUNTER — Ambulatory Visit: Payer: Medicaid Other | Admitting: Adult Health

## 2018-07-12 ENCOUNTER — Encounter: Payer: Self-pay | Admitting: *Deleted

## 2018-07-12 ENCOUNTER — Ambulatory Visit: Payer: Medicaid Other | Admitting: Adult Health

## 2018-08-27 ENCOUNTER — Encounter (INDEPENDENT_AMBULATORY_CARE_PROVIDER_SITE_OTHER): Payer: Self-pay

## 2018-08-27 ENCOUNTER — Ambulatory Visit (INDEPENDENT_AMBULATORY_CARE_PROVIDER_SITE_OTHER): Payer: Medicaid Other | Admitting: Adult Health

## 2018-08-27 ENCOUNTER — Encounter: Payer: Self-pay | Admitting: Adult Health

## 2018-08-27 VITALS — BP 118/72 | HR 80 | Ht 63.0 in | Wt 232.6 lb

## 2018-08-27 DIAGNOSIS — Z09 Encounter for follow-up examination after completed treatment for conditions other than malignant neoplasm: Secondary | ICD-10-CM

## 2018-08-27 DIAGNOSIS — Z8619 Personal history of other infectious and parasitic diseases: Secondary | ICD-10-CM

## 2018-08-27 DIAGNOSIS — Z3201 Encounter for pregnancy test, result positive: Secondary | ICD-10-CM

## 2018-08-27 DIAGNOSIS — N926 Irregular menstruation, unspecified: Secondary | ICD-10-CM | POA: Diagnosis not present

## 2018-08-27 DIAGNOSIS — Z113 Encounter for screening for infections with a predominantly sexual mode of transmission: Secondary | ICD-10-CM

## 2018-08-27 DIAGNOSIS — Z3A10 10 weeks gestation of pregnancy: Secondary | ICD-10-CM

## 2018-08-27 DIAGNOSIS — O3680X0 Pregnancy with inconclusive fetal viability, not applicable or unspecified: Secondary | ICD-10-CM

## 2018-08-27 LAB — POCT URINE PREGNANCY: Preg Test, Ur: POSITIVE — AB

## 2018-08-27 MED ORDER — PRENATE MINI 18-0.6-0.4-350 MG PO CAPS: 1.0000 | ORAL_CAPSULE | Freq: Every day | ORAL | 12 refills | Status: DC

## 2018-08-27 NOTE — Progress Notes (Signed)
  Subjective:     Patient ID: Rose Holden, female   DOB: 1991-06-08, 27 y.o.   MRN: [id number]  HPI Rose Holden is a 27 year old black female in for proof of cur for +GC that was treated 06/25/18. She has had sex, and she guesses her was treated.She has missed 2 periods.   Review of Systems +missed periods Reviewed past medical,surgical, social and family history. Reviewed medications and allergies.     Objective:   Physical Exam BP 118/72 (BP Location: Right Arm, Patient Position: Sitting, Cuff Size: Large)   Pulse 80   Ht 5\' 3"  (1.6 m)   Wt 232 lb 9.6 oz (105.5 kg)   LMP 06/15/2018   Breastfeeding? No   BMI 41.20 kg/m UPT +,a bout 10+3 weeks by LMP with EDD 03/23/19.Skin warm and dry. Neck: mid line trachea, normal thyroid, good ROM, no lymphadenopathy noted. Lungs: clear to ausculation bilaterally. Cardiovascular: regular rate and rhythm.Abdomen is soft and nontender. Pelvic: external genitalia is normal in appearance no lesions, vagina: white discharge without odor,urethra has no lesions or masses noted, cervix:smooth and bulbous, uterus: about 10 weeks size, non tender, no masses felt, adnexa: no masses or tenderness noted. Bladder is non tender and no masses felt. GC/CHL obtained.     Assessment:     1. History of gonorrhea   2. Screening examination for STD (sexually transmitted disease)   3. Pregnancy examination or test, positive result   4. [redacted] weeks gestation of pregnancy   5. Encounter to determine fetal viability of pregnancy, single or unspecified fetus       Plan:     GC/CHL sent Meds ordered this encounter  Medications  . Prenat-FeCbn-FeAsp-Meth-FA-DHA (PRENATE MINI) 18-0.6-0.4-350 MG CAPS    Sig: Take 1 tablet by mouth daily.    Dispense:  30 capsule    Refill:  12    Order Specific Question:   Supervising Provider    Answer:   Lazaro Arms [[id number]]  Return in 1 week for dating and 2 weeks for new OB Review handouts on First trimester and by [facility] tree

## 2018-08-27 NOTE — Patient Instructions (Signed)
First Trimester of Pregnancy The first trimester of pregnancy is from week 1 until the end of week 13 (months 1 through 3). A week after a sperm fertilizes an egg, the egg will implant on the wall of the uterus. This embryo will begin to develop into a baby. Genes from you and your partner will form the baby. The female genes will determine whether the baby will be a boy or a girl. At 6-8 weeks, the eyes and face will be formed, and the heartbeat can be seen on ultrasound. At the end of 12 weeks, all the baby's organs will be formed. Now that you are pregnant, you will want to do everything you can to have a healthy baby. Two of the most important things are to get good prenatal care and to follow your health care provider's instructions. Prenatal care is all the medical care you receive before the baby's birth. This care will help prevent, find, and treat any problems during the pregnancy and childbirth. Body changes during your first trimester Your body goes through many changes during pregnancy. The changes vary from woman to woman.  You may gain or lose a couple of pounds at first.  You may feel sick to your stomach (nauseous) and you may throw up (vomit). If the vomiting is uncontrollable, call your health care provider.  You may tire easily.  You may develop headaches that can be relieved by medicines. All medicines should be approved by your health care provider.  You may urinate more often. Painful urination may mean you have a bladder infection.  You may develop heartburn as a result of your pregnancy.  You may develop constipation because certain hormones are causing the muscles that push stool through your intestines to slow down.  You may develop hemorrhoids or swollen veins (varicose veins).  Your breasts may begin to grow larger and become tender. Your nipples may stick out more, and the tissue that surrounds them (areola) may become darker.  Your gums may bleed and may be  sensitive to brushing and flossing.  Dark spots or blotches (chloasma, mask of pregnancy) may develop on your face. This will likely fade after the baby is born.  Your menstrual periods will stop.  You may have a loss of appetite.  You may develop cravings for certain kinds of food.  You may have changes in your emotions from day to day, such as being excited to be pregnant or being concerned that something may go wrong with the pregnancy and baby.  You may have more vivid and strange dreams.  You may have changes in your hair. These can include thickening of your hair, rapid growth, and changes in texture. Some women also have hair loss during or after pregnancy, or hair that feels dry or thin. Your hair will most likely return to normal after your baby is born.  What to expect at prenatal visits During a routine prenatal visit:  You will be weighed to make sure you and the baby are growing normally.  Your blood pressure will be taken.  Your abdomen will be measured to track your baby's growth.  The fetal heartbeat will be listened to between weeks 10 and 14 of your pregnancy.  Test results from any previous visits will be discussed.  Your health care provider may ask you:  How you are feeling.  If you are feeling the baby move.  If you have had any abnormal symptoms, such as leaking fluid, bleeding, severe headaches,   or abdominal cramping.  If you are using any tobacco products, including cigarettes, chewing tobacco, and electronic cigarettes.  If you have any questions.  Other tests that may be performed during your first trimester include:  Blood tests to find your blood type and to check for the presence of any previous infections. The tests will also be used to check for low iron levels (anemia) and protein on red blood cells (Rh antibodies). Depending on your risk factors, or if you previously had diabetes during pregnancy, you may have tests to check for high blood  sugar that affects pregnant women (gestational diabetes).  Urine tests to check for infections, diabetes, or protein in the urine.  An ultrasound to confirm the proper growth and development of the baby.  Fetal screens for spinal cord problems (spina bifida) and Down syndrome.  HIV (human immunodeficiency virus) testing. Routine prenatal testing includes screening for HIV, unless you choose not to have this test.  You may need other tests to make sure you and the baby are doing well.  Follow these instructions at home: Medicines  Follow your health care provider's instructions regarding medicine use. Specific medicines may be either safe or unsafe to take during pregnancy.  Take a prenatal vitamin that contains at least 600 micrograms (mcg) of folic acid.  If you develop constipation, try taking a stool softener if your health care provider approves. Eating and drinking  Eat a balanced diet that includes fresh fruits and vegetables, whole grains, good sources of protein such as meat, eggs, or tofu, and low-fat dairy. Your health care provider will help you determine the amount of weight gain that is right for you.  Avoid raw meat and uncooked cheese. These carry germs that can cause birth defects in the baby.  Eating four or five small meals rather than three large meals a day may help relieve nausea and vomiting. If you start to feel nauseous, eating a few soda crackers can be helpful. Drinking liquids between meals, instead of during meals, also seems to help ease nausea and vomiting.  Limit foods that are high in fat and processed sugars, such as fried and sweet foods.  To prevent constipation: ? Eat foods that are high in fiber, such as fresh fruits and vegetables, whole grains, and beans. ? Drink enough fluid to keep your urine clear or pale yellow. Activity  Exercise only as directed by your health care provider. Most women can continue their usual exercise routine during  pregnancy. Try to exercise for 30 minutes at least 5 days a week. Exercising will help you: ? Control your weight. ? Stay in shape. ? Be prepared for labor and delivery.  Experiencing pain or cramping in the lower abdomen or lower back is a good sign that you should stop exercising. Check with your health care provider before continuing with normal exercises.  Try to avoid standing for long periods of time. Move your legs often if you must stand in one place for a long time.  Avoid heavy lifting.  Wear low-heeled shoes and practice good posture.  You may continue to have sex unless your health care provider tells you not to. Relieving pain and discomfort  Wear a good support bra to relieve breast tenderness.  Take warm sitz baths to soothe any pain or discomfort caused by hemorrhoids. Use hemorrhoid cream if your health care provider approves.  Rest with your legs elevated if you have leg cramps or low back pain.  If you develop   varicose veins in your legs, wear support hose. Elevate your feet for 15 minutes, 3-4 times a day. Limit salt in your diet. Prenatal care  Schedule your prenatal visits by the twelfth week of pregnancy. They are usually scheduled monthly at first, then more often in the last 2 months before delivery.  Write down your questions. Take them to your prenatal visits.  Keep all your prenatal visits as told by your health care provider. This is important. Safety  Wear your seat belt at all times when driving.  Make a list of emergency phone numbers, including numbers for family, friends, the hospital, and police and fire departments. General instructions  Ask your health care provider for a referral to a local prenatal education class. Begin classes no later than the beginning of month 6 of your pregnancy.  Ask for help if you have counseling or nutritional needs during pregnancy. Your health care provider can offer advice or refer you to specialists for help  with various needs.  Do not use hot tubs, steam rooms, or saunas.  Do not douche or use tampons or scented sanitary pads.  Do not cross your legs for long periods of time.  Avoid cat litter boxes and soil used by cats. These carry germs that can cause birth defects in the baby and possibly loss of the fetus by miscarriage or stillbirth.  Avoid all smoking, herbs, alcohol, and medicines not prescribed by your health care provider. Chemicals in these products affect the formation and growth of the baby.  Do not use any products that contain nicotine or tobacco, such as cigarettes and e-cigarettes. If you need help quitting, ask your health care provider. You may receive counseling support and other resources to help you quit.  Schedule a dentist appointment. At home, brush your teeth with a soft toothbrush and be gentle when you floss. Contact a health care provider if:  You have dizziness.  You have mild pelvic cramps, pelvic pressure, or nagging pain in the abdominal area.  You have persistent nausea, vomiting, or diarrhea.  You have a bad smelling vaginal discharge.  You have pain when you urinate.  You notice increased swelling in your face, hands, legs, or ankles.  You are exposed to fifth disease or chickenpox.  You are exposed to German measles (rubella) and have never had it. Get help right away if:  You have a fever.  You are leaking fluid from your vagina.  You have spotting or bleeding from your vagina.  You have severe abdominal cramping or pain.  You have rapid weight gain or loss.  You vomit blood or material that looks like coffee grounds.  You develop a severe headache.  You have shortness of breath.  You have any kind of trauma, such as from a fall or a car accident. Summary  The first trimester of pregnancy is from week 1 until the end of week 13 (months 1 through 3).  Your body goes through many changes during pregnancy. The changes vary from  woman to woman.  You will have routine prenatal visits. During those visits, your health care provider will examine you, discuss any test results you may have, and talk with you about how you are feeling. This information is not intended to replace advice given to you by your health care provider. Make sure you discuss any questions you have with your health care provider. Document Released: 10/25/2001 Document Revised: 10/12/2016 Document Reviewed: 10/12/2016 Elsevier Interactive Patient Education  2018 [employer]vier   Inc.  

## 2018-08-29 LAB — GC/CHLAMYDIA PROBE AMP
Chlamydia trachomatis, NAA: NEGATIVE
NEISSERIA GONORRHOEAE BY PCR: NEGATIVE

## 2018-09-03 ENCOUNTER — Telehealth: Payer: Self-pay | Admitting: *Deleted

## 2018-09-03 ENCOUNTER — Other Ambulatory Visit: Payer: Medicaid Other

## 2018-09-03 ENCOUNTER — Ambulatory Visit (INDEPENDENT_AMBULATORY_CARE_PROVIDER_SITE_OTHER): Payer: Medicaid Other

## 2018-09-03 ENCOUNTER — Other Ambulatory Visit: Payer: Self-pay | Admitting: Adult Health

## 2018-09-03 DIAGNOSIS — Z3682 Encounter for antenatal screening for nuchal translucency: Secondary | ICD-10-CM | POA: Diagnosis not present

## 2018-09-03 DIAGNOSIS — O3680X0 Pregnancy with inconclusive fetal viability, not applicable or unspecified: Secondary | ICD-10-CM

## 2018-09-03 NOTE — Progress Notes (Signed)
[place] 13+2 wks,single IUP,CRL 71.29 mm,NB present,NT 1.8 mm,normal ovaries bilat,posterior pl gr 0

## 2018-09-03 NOTE — Telephone Encounter (Signed)
Patient called for test results.  Informed gc/chl negative.

## 2018-09-05 LAB — INTEGRATED 1
Crown Rump Length: 71.3 mm
Gest. Age on Collection Date: 13.1 weeks
Maternal Age at EDD: 29.8 yr
Nuchal Translucency (NT): 1.8 mm
Number of Fetuses: 1
PAPP-A VALUE: 1076 ng/mL
Weight: 230 [lb_av]

## 2018-09-10 ENCOUNTER — Encounter: Payer: Self-pay | Admitting: *Deleted

## 2018-09-10 ENCOUNTER — Ambulatory Visit: Payer: Medicaid Other | Admitting: *Deleted

## 2018-09-10 ENCOUNTER — Encounter: Payer: Medicaid Other | Admitting: Women's Health

## 2018-09-17 ENCOUNTER — Encounter (HOSPITAL_COMMUNITY): Payer: Self-pay | Admitting: Emergency Medicine

## 2018-09-17 ENCOUNTER — Emergency Department (HOSPITAL_COMMUNITY)
Admission: EM | Admit: 2018-09-17 | Discharge: 2018-09-17 | Disposition: A | Discharge status: Home / Self Care | Payer: Medicaid Other | Attending: Emergency Medicine | Admitting: Emergency Medicine

## 2018-09-17 ENCOUNTER — Emergency Department (HOSPITAL_COMMUNITY): Payer: Medicaid Other

## 2018-09-17 ENCOUNTER — Other Ambulatory Visit: Payer: Self-pay

## 2018-09-17 DIAGNOSIS — G43109 Migraine with aura, not intractable, without status migrainosus: Secondary | ICD-10-CM | POA: Diagnosis not present

## 2018-09-17 DIAGNOSIS — F17210 Nicotine dependence, cigarettes, uncomplicated: Secondary | ICD-10-CM | POA: Diagnosis not present

## 2018-09-17 DIAGNOSIS — R51 Headache: Secondary | ICD-10-CM | POA: Diagnosis present

## 2018-09-17 DIAGNOSIS — Z79899 Other long term (current) drug therapy: Secondary | ICD-10-CM | POA: Insufficient documentation

## 2018-09-17 DIAGNOSIS — H538 Other visual disturbances: Secondary | ICD-10-CM | POA: Diagnosis not present

## 2018-09-17 HISTORY — DX: Bell's palsy: G51.0

## 2018-09-17 MED ORDER — METOCLOPRAMIDE HCL 5 MG/ML IJ SOLN
10.0000 mg | Freq: Once | INTRAMUSCULAR | Status: AC
  Administered 2018-09-17: 10 mg via INTRAVENOUS
  Filled 2018-09-17: qty 2

## 2018-09-17 MED ORDER — DIPHENHYDRAMINE HCL 50 MG/ML IJ SOLN
12.5000 mg | Freq: Once | INTRAMUSCULAR | Status: AC
  Administered 2018-09-17: 12.5 mg via INTRAVENOUS
  Filled 2018-09-17: qty 1

## 2018-09-17 NOTE — ED Notes (Signed)
PT left prior to receiving her d/c paperwork. PT verbalized understanding of follow up and has appt scheduled for f/u with OBGYN.

## 2018-09-17 NOTE — ED Triage Notes (Signed)
Pt reports eating lunch Saturday and eyes became blurry and developed right weakness to the point she couldn't lift her fork or hold anything. Pt states arm weakness has subsided. Pt states she has had a headache since incident. Pt is 3.5 months pregnant.pt reports only symptom today is headache.

## 2018-09-17 NOTE — Discharge Instructions (Addendum)
Take over-the-counter medications as needed, follow-up with a primary care doctor if you have any recurrent episodes, please review the discharge instructions for additional information

## 2018-09-17 NOTE — ED Provider Notes (Signed)
[facility] EMERGENCY DEPARTMENT Provider Note   CSN: [id number] Arrival date & time: 09/17/18  6295     History   Chief Complaint Chief Complaint  Patient presents with  . Headache    HPI Rose Holden is a 27 y.o. female.  HPI Pt states she had an episode over the weekend where she developed numbness and weakness on her right side that lasted for an hour. She actually dropped her fork.  She felt fine after that on Sunday but she started to have a headache.  Today the headache has persisted.  No fevers.  No trouble with speech. Past Medical History:  Diagnosis Date  . Bell's palsy   . Depression   . Gonorrhea 06/27/2018   Treated POC__________  . Medical history non-contributory   . Nausea & vomiting 02/17/2016  . Pollen allergies 02/17/2016  . Pregnant 02/17/2016  . Vaginal discharge 02/17/2016    Patient Active Problem List   Diagnosis Date Noted  . History of gonorrhea 08/27/2018  . Pregnancy examination or test, positive result 08/27/2018  . [redacted] weeks gestation of pregnancy 08/27/2018  . Gonorrhea 06/27/2018  . Atypical squamous cell changes of undetermined significance (ASCUS) on cervical cytology with negative high risk human papilloma virus (HPV) test result 03/02/2017  . Positive urine pregnancy test 02/23/2017  . Urinary urgency 02/23/2017  . Indication for care in labor or delivery 09/08/2016  . Encounter to determine fetal viability of pregnancy 02/17/2016  . Vaginal discharge 02/17/2016  . Nausea & vomiting 02/17/2016  . Pollen allergies 02/17/2016  . MDD (major depressive disorder), single episode 08/15/2014  . Supervision of other normal pregnancy 06/13/2013  . Trichomonal vaginitis in pregnancy 03/03/2013  . 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 Prenatal care insufficient 02/28/2013    Past Surgical History:  Procedure Laterality Date  . broken leg     closed reduction  . NO PAST SURGERIES       OB History    Gravida  7    Para  4   Term  4   Preterm      AB  2   Living  4     SAB      TAB  1   Ectopic      Multiple  0   Live Births  4            Home Medications    Prior to Admission medications   Medication Sig Start Date End Date Taking? Authorizing Provider  Prenat-FeCbn-FeAsp-Meth-FA-DHA (PRENATE MINI) 18-0.6-0.4-350 MG CAPS Take 1 tablet by mouth daily. 08/27/18  Yes Adline Potter, NP    Family History Family History  Problem Relation Age of Onset  . Hypertension Mother   . Glaucoma Mother   . Cancer Maternal Grandmother     Social History Social History   Tobacco Use  . Smoking status: Current Every Day Smoker    Packs/day: 1.00    Years: 11.00    Pack years: 11.00    Types: Cigarettes  . Smokeless tobacco: Never Used  . Tobacco comment: smokes 10 cig daily  Substance Use Topics  . Alcohol use: Not Currently  . Drug use: Yes    Types: Marijuana    Comment: weekly     Allergies   Patient has no known allergies.   Review of Systems Review of Systems  All other systems reviewed and are negative.    Physical Exam Updated Vital Signs BP 114/64  Pulse 72   Temp 98 F (36.7 C) (Oral)   Resp 18   Ht 1.6 m (5\' 3" )   Wt 103.9 kg   LMP  (LMP Unknown)   SpO2 99%   BMI 40.57 kg/m   Physical Exam  Constitutional: She is oriented to person, place, and time. She appears well-developed and well-nourished. No distress.  HENT:  Head: Normocephalic and atraumatic.  Right Ear: External ear normal.  Left Ear: External ear normal.  Mouth/Throat: Oropharynx is clear and moist.  Eyes: Conjunctivae are normal. Right eye exhibits no discharge. Left eye exhibits no discharge. No scleral icterus.  Neck: Neck supple. No tracheal deviation present.  Cardiovascular: Normal rate, regular rhythm and intact distal pulses.  Pulmonary/Chest: Effort normal and breath sounds normal. No stridor. No respiratory distress. She has no wheezes. She has no rales.    Abdominal: Soft. Bowel sounds are normal. She exhibits no distension. There is no tenderness. There is no rebound and no guarding.  Musculoskeletal: She exhibits no edema or tenderness.  Neurological: She is alert and oriented to person, place, and time. She has normal strength. No cranial nerve deficit (No facial droop, extraocular movements intact, tongue midline ) or sensory deficit. She exhibits normal muscle tone. She displays no seizure activity. Coordination normal.  No pronator drift bilateral upper extrem, able to hold both legs off bed for 5 seconds, sensation intact in all extremities, no visual field cuts, no left or right sided neglect, normal finger-nose exam bilaterally, no nystagmus noted   Skin: Skin is warm and dry. No rash noted.  Psychiatric: She has a normal mood and affect.  Nursing note and vitals reviewed.    ED Treatments / Results  Labs (all labs ordered are listed, but only abnormal results are displayed) Labs Reviewed - No data to display  EKG None  Radiology Mr Brain Wo Contrast  Result Date: 09/17/2018 CLINICAL DATA:  27 year old female in the 2nd trimester of pregnancy with acute onset blurred vision, right side weakness while eating lunch 2 days ago. Subsequent headache. EXAM: MRI HEAD WITHOUT CONTRAST TECHNIQUE: Multiplanar, multiecho pulse sequences of the brain and surrounding structures were obtained without intravenous contrast. COMPARISON:  Brain MRI 07/23/2016. FINDINGS: Brain: Stable and normal cerebral volume. Basilar cisterns appear within normal limits today. No restricted diffusion to suggest acute infarction. No midline shift, mass effect, evidence of mass lesion, ventriculomegaly, extra-axial collection or acute intracranial hemorrhage. Cervicomedullary junction and pituitary are within normal limits. Wallace Cullens and white matter signal is within normal limits throughout the brain. No chronic cerebral blood products or encephalomalacia. Vascular: Major  intracranial vascular flow voids are stable and within normal limits. Skull and upper cervical spine: Negative visible cervical spine. Sinuses/Orbits: Negative orbit soft tissues. Trace paranasal sinus mucosal thickening. Other: Visible internal auditory structures appear normal. Mastoids remain clear. Scalp and face soft tissues appear negative. IMPRESSION: No acute intracranial abnormality. Normal noncontrast MRI appearance of the brain. Electronically Signed   By: Odessa Fleming M.D.   On: 09/17/2018 12:24    Procedures Procedures (including critical care time)  Medications Ordered in ED Medications  diphenhydrAMINE (BENADRYL) injection 12.5 mg (12.5 mg Intravenous Given 09/17/18 1216)  metoCLOPramide (REGLAN) injection 10 mg (10 mg Intravenous Given 09/17/18 1217)     Initial Impression / Assessment and Plan / ED Course  I have reviewed the triage vital signs and the nursing notes.  Pertinent labs & imaging results that were available during my care of the patient were reviewed by  me and considered in my medical decision making (see chart for details).    Patient presented to the emergency room for evaluation of neurologic symptoms associated with headache.  Her neurologic symptoms resolved but the headache persisted.  Patient's symptoms were suggestive of possible migraine headache however with the acute neurologic symptoms MRI was performed.  MRI does not show any acute abnormality.  No signs of tumor or stroke.   suspect the symptoms may been related to a migraine headache.  Her headache improved with a migraine cocktail At this time there does not appear to be any evidence of an acute emergency medical condition and the patient appears stable for discharge with appropriate outpatient follow up.   Final Clinical Impressions(s) / ED Diagnoses   Final diagnoses:  Migraine with aura and without status migrainosus, not intractable    ED Discharge Orders    None       Linwood Dibbles,  MD 09/17/18 1334

## 2018-09-26 ENCOUNTER — Ambulatory Visit: Payer: Medicaid Other | Admitting: *Deleted

## 2018-09-27 ENCOUNTER — Ambulatory Visit: Payer: Medicaid Other | Admitting: *Deleted

## 2018-09-27 ENCOUNTER — Encounter: Payer: Medicaid Other | Admitting: Advanced Practice Midwife

## 2018-09-27 ENCOUNTER — Encounter: Payer: Self-pay | Admitting: *Deleted

## 2018-10-19 ENCOUNTER — Other Ambulatory Visit: Payer: Self-pay

## 2018-10-19 ENCOUNTER — Ambulatory Visit: Payer: Medicaid Other | Admitting: *Deleted

## 2018-10-19 ENCOUNTER — Ambulatory Visit (INDEPENDENT_AMBULATORY_CARE_PROVIDER_SITE_OTHER): Payer: Medicaid Other | Admitting: Women's Health

## 2018-10-19 ENCOUNTER — Encounter: Payer: Self-pay | Admitting: Women's Health

## 2018-10-19 VITALS — BP 110/65 | HR 76 | Wt 226.0 lb

## 2018-10-19 DIAGNOSIS — Z3A19 19 weeks gestation of pregnancy: Secondary | ICD-10-CM

## 2018-10-19 DIAGNOSIS — J069 Acute upper respiratory infection, unspecified: Secondary | ICD-10-CM

## 2018-10-19 DIAGNOSIS — Z1379 Encounter for other screening for genetic and chromosomal anomalies: Secondary | ICD-10-CM

## 2018-10-19 DIAGNOSIS — O0932 Supervision of pregnancy with insufficient antenatal care, second trimester: Secondary | ICD-10-CM

## 2018-10-19 DIAGNOSIS — Z3490 Encounter for supervision of normal pregnancy, unspecified, unspecified trimester: Secondary | ICD-10-CM | POA: Insufficient documentation

## 2018-10-19 DIAGNOSIS — Z363 Encounter for antenatal screening for malformations: Secondary | ICD-10-CM

## 2018-10-19 DIAGNOSIS — Z3482 Encounter for supervision of other normal pregnancy, second trimester: Secondary | ICD-10-CM | POA: Diagnosis not present

## 2018-10-19 DIAGNOSIS — F17200 Nicotine dependence, unspecified, uncomplicated: Secondary | ICD-10-CM | POA: Insufficient documentation

## 2018-10-19 DIAGNOSIS — Z1389 Encounter for screening for other disorder: Secondary | ICD-10-CM

## 2018-10-19 DIAGNOSIS — O99332 Smoking (tobacco) complicating pregnancy, second trimester: Secondary | ICD-10-CM

## 2018-10-19 DIAGNOSIS — Z331 Pregnant state, incidental: Secondary | ICD-10-CM

## 2018-10-19 LAB — POCT URINALYSIS DIPSTICK OB
Blood, UA: NEGATIVE
Glucose, UA: NEGATIVE
KETONES UA: NEGATIVE
Leukocytes, UA: NEGATIVE
NITRITE UA: NEGATIVE

## 2018-10-19 MED ORDER — AZITHROMYCIN 250 MG PO TABS: ORAL_TABLET | ORAL | 0 refills | Status: DC

## 2018-10-19 NOTE — Progress Notes (Signed)
INITIAL OBSTETRICAL VISIT Patient name: Rose Holden MRN [id number]  Date of birth: 09-26-1991 Chief Complaint:   Initial Prenatal Visit  History of Present Illness:   Rose Holden is a 27 y.o. [id number] African American female at [redacted]w[redacted]d by 13wk u/s, with an Estimated Date of Delivery: 03/09/19 being seen today for her initial obstetrical visit.   Her obstetrical history is significant for term uncomplicated SVB x 4, EAB x 1, SAB x 1.   Today she reports cold sx x , alka seltzer not helping, has also been taking ibuprofen. Denies fever/chills. Does have cough. +FM.  Smoker: 1ppd prior to pregnancy, now 1/2ppd Wants BTL No LMP recorded (lmp unknown). Patient is pregnant. Last pap 06/25/18. Results were: normal Review of Systems:   Pertinent items are noted in HPI Denies cramping/contractions, leakage of fluid, vaginal bleeding, abnormal vaginal discharge w/ itching/odor/irritation, headaches, visual changes, shortness of breath, chest pain, abdominal pain, severe nausea/vomiting, or problems with urination or bowel movements unless otherwise stated above.  Pertinent History Reviewed:  Reviewed past medical,surgical, social, obstetrical and family history.  Reviewed problem list, medications and allergies. OB History  Gravida Para Term Preterm AB Living  7 4 4   2 4   SAB TAB Ectopic Multiple Live Births    1   0 4    # Outcome Date GA Lbr Len/2nd Weight Sex Delivery Anes PTL Lv  7 Current           6 AB 2018          5 Term 09/08/16 [redacted]w[redacted]d 14:02 / 01:27 7 lb 10.9 oz (3.485 kg) M Vag-Spont EPI N LIV  4 TAB 2015          3 Term 06/27/13 [redacted]w[redacted]d 10:34 / 00:03 7 lb 4.2 oz (3.294 kg) F Vag-Spont EPI N LIV     Birth Comments: 39 weeks, NSVD Mom 24 y/o G3P3, GBS positive, + tobacco exposure, + THC July 2014, + chlamydia March, no TOC, neg GC, Pre-eclampsia, ROM 10 hrs, PCN x 2 > 4hrs  Bili 2.5@ 27 hrs, Mom A pos/ Baby ?  2 Term 04/26/11 [redacted]w[redacted]d  8 lb (3.629 kg) M Vag-Spont EPI N LIV  1 Term  09/01/07 [redacted]w[redacted]d  5 lb (2.268 kg) F Vag-Spont EPI N LIV   Physical Assessment:   Vitals:   10/19/18 1248  BP: 110/65  Pulse: 76  Weight: 226 lb (102.5 kg)  Body mass index is 40.03 kg/m.       Physical Examination:  General appearance - well appearing, and in no distress  Mental status - alert, oriented to person, place, and time  Psych:  She has a normal mood and affect  Skin - warm and dry, normal color, no suspicious lesions noted  Chest - effort normal, all lung fields clear to auscultation bilaterally  Heart - normal rate and regular rhythm  Abdomen - soft, nontender  Extremities:  No swelling or varicosities noted  Thin prep pap is not done   Fetal Heart Rate (bpm): 154 via doppler  Results for orders placed or performed in visit on 10/19/18 (from the past 24 hour(s))  POC Urinalysis Dipstick OB   Collection Time: 10/19/18 12:51 PM  Result Value Ref Range   Color, UA     Clarity, UA     Glucose, UA Negative Negative   Bilirubin, UA     Ketones, UA neg    Spec Grav, UA     Blood, UA neg  pH, UA     POC,PROTEIN,UA Trace Negative, Trace, Small (1+), Moderate (2+), Large (3+), 4+   Urobilinogen, UA     Nitrite, UA neg    Leukocytes, UA Negative Negative   Appearance     Odor      Assessment & Plan:  1) Low-Risk Pregnancy [id number] at 6476w6d with an Estimated Date of Delivery: 03/09/19   2) Initial OB visit  3) URI>rx zpak, do not take any more ibuprofen, tylenol if needed, will wait til next visit for flu shot  4) Late pnc  5) Smoker> advised cessation  Meds:  Meds ordered this encounter  Medications  . azithromycin (ZITHROMAX Z-PAK) 250 MG tablet    Sig: Use as directed    Dispense:  6 each    Refill:  0    Order Specific Question:   Supervising Provider    Answer:   Duane LopeEURE, LUTHER H [[id number]]    Initial labs obtained Continue prenatal vitamins Reviewed n/v relief measures and warning s/s to report Reviewed recommended weight gain based on pre-gravid  BMI Encouraged well-balanced diet Genetic Screening discussed Integrated Screen: has already done 1st IT, doing 2nd IT today Cystic fibrosis screening discussed neg prev preg Ultrasound discussed; fetal survey: requested CCNC completed>PCM not here  Follow-up: Return for asap anatomy u/s (no visit), then 4wks LROB.   Orders Placed This Encounter  Procedures  . GC/Chlamydia Probe Amp  . Urine Culture  . US OB Comp + 14 Wk  . Obstetric Panel, Including HIV  . Urinalysis, Routine w reflex microscopic  . Sickle cell screen  . INTEGRATED 2  . Pain Management Screening Profile (10S)  . POC Urinalysis Dipstick OB    Cheral MarkerKimberly R Rynlee Lisbon CNM, WHNP-BC 10/19/2018 1:20 PM

## 2018-10-19 NOTE — Patient Instructions (Addendum)
Chistina D Kaminski, I greatly value your feedback.  If you receive a survey following your visit with us today, we appreciate you taking the time to fill it out.  Thanks, Joellyn HaffKim Mykeal Carrick, CNM, WHNP-BC   Second Trimester of Pregnancy The second trimester is from week 14 through week 27 (months 4 through 6). The second trimester is often a time when you feel your best. Your body has adjusted to being pregnant, and you begin to feel better physically. Usually, morning sickness has lessened or quit completely, you may have more energy, and you may have an increase in appetite. The second trimester is also a time when the fetus is growing rapidly. At the end of the sixth month, the fetus is about 9 inches long and weighs about 1 pounds. You will likely begin to feel the baby move (quickening) between 16 and 20 weeks of pregnancy. Body changes during your second trimester Your body continues to go through many changes during your second trimester. The changes vary from woman to woman.  Your weight will continue to increase. You will notice your lower abdomen bulging out.  You may begin to get stretch marks on your hips, abdomen, and breasts.  You may develop headaches that can be relieved by medicines. The medicines should be approved by your health care provider.  You may urinate more often because the fetus is pressing on your bladder.  You may develop or continue to have heartburn as a result of your pregnancy.  You may develop constipation because certain hormones are causing the muscles that push waste through your intestines to slow down.  You may develop hemorrhoids or swollen, bulging veins (varicose veins).  You may have back pain. This is caused by: ? Weight gain. ? Pregnancy hormones that are relaxing the joints in your pelvis. ? A shift in weight and the muscles that support your balance.  Your breasts will continue to grow and they will continue to become tender.  Your gums may bleed and  may be sensitive to brushing and flossing.  Dark spots or blotches (chloasma, mask of pregnancy) may develop on your face. This will likely fade after the baby is born.  A dark line from your belly button to the pubic area (linea nigra) may appear. This will likely fade after the baby is born.  You may have changes in your hair. These can include thickening of your hair, rapid growth, and changes in texture. Some women also have hair loss during or after pregnancy, or hair that feels dry or thin. Your hair will most likely return to normal after your baby is born.  What to expect at prenatal visits During a routine prenatal visit:  You will be weighed to make sure you and the fetus are growing normally.  Your blood pressure will be taken.  Your abdomen will be measured to track your baby's growth.  The fetal heartbeat will be listened to.  Any test results from the previous visit will be discussed.  Your health care provider may ask you:  How you are feeling.  If you are feeling the baby move.  If you have had any abnormal symptoms, such as leaking fluid, bleeding, severe headaches, or abdominal cramping.  If you are using any tobacco products, including cigarettes, chewing tobacco, and electronic cigarettes.  If you have any questions.  Other tests that may be performed during your second trimester include:  Blood tests that check for: ? Low iron levels (anemia). ? High  blood sugar that affects pregnant women (gestational diabetes) between 3 and 28 weeks. ? Rh antibodies. This is to check for a protein on red blood cells (Rh factor).  Urine tests to check for infections, diabetes, or protein in the urine.  An ultrasound to confirm the proper growth and development of the baby.  An amniocentesis to check for possible genetic problems.  Fetal screens for spina bifida and Down syndrome.  HIV (human immunodeficiency virus) testing. Routine prenatal testing includes  screening for HIV, unless you choose not to have this test.  Follow these instructions at home: Medicines  Follow your health care provider's instructions regarding medicine use. Specific medicines may be either safe or unsafe to take during pregnancy.  Take a prenatal vitamin that contains at least 600 micrograms (mcg) of folic acid.  If you develop constipation, try taking a stool softener if your health care provider approves. Eating and drinking  Eat a balanced diet that includes fresh fruits and vegetables, whole grains, good sources of protein such as meat, eggs, or tofu, and low-fat dairy. Your health care provider will help you determine the amount of weight gain that is right for you.  Avoid raw meat and uncooked cheese. These carry germs that can cause birth defects in the baby.  If you have low calcium intake from food, talk to your health care provider about whether you should take a daily calcium supplement.  Limit foods that are high in fat and processed sugars, such as fried and sweet foods.  To prevent constipation: ? Drink enough fluid to keep your urine clear or pale yellow. ? Eat foods that are high in fiber, such as fresh fruits and vegetables, whole grains, and beans. Activity  Exercise only as directed by your health care provider. Most women can continue their usual exercise routine during pregnancy. Try to exercise for 30 minutes at least 5 days a week. Stop exercising if you experience uterine contractions.  Avoid heavy lifting, wear low heel shoes, and practice good posture.  A sexual relationship may be continued unless your health care provider directs you otherwise. Relieving pain and discomfort  Wear a good support bra to prevent discomfort from breast tenderness.  Take warm sitz baths to soothe any pain or discomfort caused by hemorrhoids. Use hemorrhoid cream if your health care provider approves.  Rest with your legs elevated if you have leg cramps  or low back pain.  If you develop varicose veins, wear support hose. Elevate your feet for 15 minutes, 3-4 times a day. Limit salt in your diet. Prenatal Care  Write down your questions. Take them to your prenatal visits.  Keep all your prenatal visits as told by your health care provider. This is important. Safety  Wear your seat belt at all times when driving.  Make a list of emergency phone numbers, including numbers for family, friends, the hospital, and police and fire departments. General instructions  Ask your health care provider for a referral to a local prenatal education class. Begin classes no later than the beginning of month 6 of your pregnancy.  Ask for help if you have counseling or nutritional needs during pregnancy. Your health care provider can offer advice or refer you to specialists for help with various needs.  Do not use hot tubs, steam rooms, or saunas.  Do not douche or use tampons or scented sanitary pads.  Do not cross your legs for long periods of time.  Avoid cat litter boxes and soil  used by cats. These carry germs that can cause birth defects in the baby and possibly loss of the fetus by miscarriage or stillbirth.  Avoid all smoking, herbs, alcohol, and unprescribed drugs. Chemicals in these products can affect the formation and growth of the baby.  Do not use any products that contain nicotine or tobacco, such as cigarettes and e-cigarettes. If you need help quitting, ask your health care provider.  Visit your dentist if you have not gone yet during your pregnancy. Use a soft toothbrush to brush your teeth and be gentle when you floss. Contact a health care provider if:  You have dizziness.  You have mild pelvic cramps, pelvic pressure, or nagging pain in the abdominal area.  You have persistent nausea, vomiting, or diarrhea.  You have a bad smelling vaginal discharge.  You have pain when you urinate. Get help right away if:  You have a  fever.  You are leaking fluid from your vagina.  You have spotting or bleeding from your vagina.  You have severe abdominal cramping or pain.  You have rapid weight gain or weight loss.  You have shortness of breath with chest pain.  You notice sudden or extreme swelling of your face, hands, ankles, feet, or legs.  You have not felt your baby move in over an hour.  You have severe headaches that do not go away when you take medicine.  You have vision changes. Summary  The second trimester is from week 14 through week 27 (months 4 through 6). It is also a time when the fetus is growing rapidly.  Your body goes through many changes during pregnancy. The changes vary from woman to woman.  Avoid all smoking, herbs, alcohol, and unprescribed drugs. These chemicals affect the formation and growth your baby.  Do not use any tobacco products, such as cigarettes, chewing tobacco, and e-cigarettes. If you need help quitting, ask your health care provider.  Contact your health care provider if you have any questions. Keep all prenatal visits as told by your health care provider. This is important. This information is not intended to replace advice given to you by your health care provider. Make sure you discuss any questions you have with your health care provider. Document Released: 10/25/2001 Document Revised: 04/07/2016 Document Reviewed: 01/01/2013 Elsevier Interactive Patient Education  2017 [employer].

## 2018-10-20 LAB — MED LIST OPTION NOT SELECTED

## 2018-10-21 LAB — URINE CULTURE

## 2018-10-22 ENCOUNTER — Encounter: Payer: Self-pay | Admitting: Women's Health

## 2018-10-22 ENCOUNTER — Ambulatory Visit (INDEPENDENT_AMBULATORY_CARE_PROVIDER_SITE_OTHER): Payer: Medicaid Other

## 2018-10-22 DIAGNOSIS — Z3482 Encounter for supervision of other normal pregnancy, second trimester: Secondary | ICD-10-CM

## 2018-10-22 DIAGNOSIS — O99320 Drug use complicating pregnancy, unspecified trimester: Secondary | ICD-10-CM

## 2018-10-22 DIAGNOSIS — Z1379 Encounter for other screening for genetic and chromosomal anomalies: Secondary | ICD-10-CM | POA: Diagnosis not present

## 2018-10-22 DIAGNOSIS — Z3A19 19 weeks gestation of pregnancy: Secondary | ICD-10-CM | POA: Diagnosis not present

## 2018-10-22 DIAGNOSIS — Z363 Encounter for antenatal screening for malformations: Secondary | ICD-10-CM

## 2018-10-22 DIAGNOSIS — F1290 Cannabis use, unspecified, uncomplicated: Secondary | ICD-10-CM | POA: Insufficient documentation

## 2018-10-22 DIAGNOSIS — F1490 Cocaine use, unspecified, uncomplicated: Secondary | ICD-10-CM | POA: Insufficient documentation

## 2018-10-22 DIAGNOSIS — O0932 Supervision of pregnancy with insufficient antenatal care, second trimester: Secondary | ICD-10-CM

## 2018-10-22 LAB — PMP SCREEN PROFILE (10S), URINE
Amphetamine Scrn, Ur: NEGATIVE ng/mL
BARBITURATE SCREEN URINE: NEGATIVE ng/mL
BENZODIAZEPINE SCREEN, URINE: NEGATIVE ng/mL
CANNABINOIDS UR QL SCN: POSITIVE ng/mL — AB
Cocaine (Metab) Scrn, Ur: POSITIVE ng/mL — AB
Creatinine(Crt), U: 493.5 mg/dL — ABNORMAL HIGH (ref 20.0–300.0)
Methadone Screen, Urine: NEGATIVE ng/mL
OXYCODONE+OXYMORPHONE UR QL SCN: NEGATIVE ng/mL
Opiate Scrn, Ur: NEGATIVE ng/mL
Ph of Urine: 6.3 (ref 4.5–8.9)
Phencyclidine Qn, Ur: NEGATIVE ng/mL
Propoxyphene Scrn, Ur: NEGATIVE ng/mL

## 2018-10-22 NOTE — Progress Notes (Signed)
US 20+2 wks,posterior placenta gr 0,breech,cx 3.7 cm,svp of fluid 4.5 cm,fhr 142 bpm,efw 353 g 53%,anatomy complete,no obvious abnormalities

## 2018-10-23 ENCOUNTER — Other Ambulatory Visit: Payer: Self-pay | Admitting: Women's Health

## 2018-10-23 LAB — GC/CHLAMYDIA PROBE AMP
Chlamydia trachomatis, NAA: NEGATIVE
Neisseria gonorrhoeae by PCR: NEGATIVE

## 2018-10-23 NOTE — Progress Notes (Signed)
10/22/18: pt here for anatomy u/s and PN1. I spoke w/ her while she was here d/t +UDS for cocaine and THC. 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 Does occ smoke THC. Advised complete cessation. Discussed potential adverse effects of both cocaine and THC.  Cheral MarkerKimberly R. Booker, CNM, [facility] CenterWHNP-BC 10/22/18

## 2018-10-24 LAB — INTEGRATED 2
AFP MoM: 1
Alpha-Fetoprotein: 42.5 ng/mL
Crown Rump Length: 71.3 mm
DIA MoM: 1.26
DIA Value: 195 pg/mL
Estriol, Unconjugated: 1.46 ng/mL
Gest. Age on Collection Date: 13.1 weeks
Gestational Age: 20.1 weeks
MATERNAL AGE AT EDD: 29.8 a
Nuchal Translucency (NT): 1.8 mm
Nuchal Translucency MoM: 1.11
Number of Fetuses: 1
PAPP-A MOM: 1.52
PAPP-A Value: 1076 ng/mL
Test Results:: NEGATIVE
WEIGHT: 230 [lb_av]
Weight: 230 [lb_av]
hCG MoM: 0.69
hCG Value: 10.8 IU/mL
uE3 MoM: 0.8

## 2018-10-24 LAB — OBSTETRIC PANEL, INCLUDING HIV
Antibody Screen: NEGATIVE
Basophils Absolute: 0 10*3/uL (ref 0.0–0.2)
Basos: 0 %
EOS (ABSOLUTE): 0.4 10*3/uL (ref 0.0–0.4)
EOS: 7 %
HEMATOCRIT: 32.8 % — AB (ref 34.0–46.6)
HEMOGLOBIN: 11.1 g/dL (ref 11.1–15.9)
HIV Screen 4th Generation wRfx: NONREACTIVE
Hepatitis B Surface Ag: NEGATIVE
IMMATURE GRANS (ABS): 0 10*3/uL (ref 0.0–0.1)
IMMATURE GRANULOCYTES: 0 %
LYMPHS ABS: 1.9 10*3/uL (ref 0.7–3.1)
Lymphs: 28 %
MCH: 30 pg (ref 26.6–33.0)
MCHC: 33.8 g/dL (ref 31.5–35.7)
MCV: 89 fL (ref 79–97)
Monocytes Absolute: 0.4 10*3/uL (ref 0.1–0.9)
Monocytes: 5 %
NEUTROS PCT: 60 %
Neutrophils Absolute: 4 10*3/uL (ref 1.4–7.0)
Platelets: 347 10*3/uL (ref 150–450)
RBC: 3.7 x10E6/uL — ABNORMAL LOW (ref 3.77–5.28)
RDW: 12.7 % (ref 12.3–15.4)
RH TYPE: POSITIVE
RPR: NONREACTIVE
Rubella Antibodies, IGG: 10.2 index (ref >0.99)
WBC: 6.7 10*3/uL (ref 3.4–10.8)

## 2018-10-24 LAB — URINALYSIS, ROUTINE W REFLEX MICROSCOPIC
BILIRUBIN UA: NEGATIVE
Glucose, UA: NEGATIVE
Leukocytes, UA: NEGATIVE
NITRITE UA: NEGATIVE
RBC UA: NEGATIVE
SPEC GRAV UA: 1.024 (ref 1.005–1.030)
Urobilinogen, Ur: 1 mg/dL (ref 0.2–1.0)
pH, UA: 6 (ref 5.0–7.5)

## 2018-10-24 LAB — SICKLE CELL SCREEN: Sickle Cell Screen: NEGATIVE

## 2018-10-28 ENCOUNTER — Emergency Department (HOSPITAL_COMMUNITY)
Admission: EM | Admit: 2018-10-28 | Discharge: 2018-10-28 | Discharge status: Absent without leave | Payer: Medicaid Other

## 2018-10-29 ENCOUNTER — Encounter (HOSPITAL_COMMUNITY): Payer: Self-pay | Admitting: Emergency Medicine

## 2018-10-29 ENCOUNTER — Other Ambulatory Visit: Payer: Self-pay

## 2018-10-29 ENCOUNTER — Emergency Department (HOSPITAL_COMMUNITY)
Admission: EM | Admit: 2018-10-29 | Discharge: 2018-10-29 | Disposition: A | Discharge status: Home / Self Care | Payer: Medicaid Other | Attending: Emergency Medicine | Admitting: Emergency Medicine

## 2018-10-29 DIAGNOSIS — O9A212 Injury, poisoning and certain other consequences of external causes complicating pregnancy, second trimester: Secondary | ICD-10-CM | POA: Insufficient documentation

## 2018-10-29 DIAGNOSIS — W25XXXA Contact with sharp glass, initial encounter: Secondary | ICD-10-CM | POA: Diagnosis not present

## 2018-10-29 DIAGNOSIS — Y999 Unspecified external cause status: Secondary | ICD-10-CM | POA: Diagnosis not present

## 2018-10-29 DIAGNOSIS — S61216A Laceration without foreign body of right little finger without damage to nail, initial encounter: Secondary | ICD-10-CM | POA: Diagnosis not present

## 2018-10-29 DIAGNOSIS — Y939 Activity, unspecified: Secondary | ICD-10-CM | POA: Insufficient documentation

## 2018-10-29 DIAGNOSIS — F17210 Nicotine dependence, cigarettes, uncomplicated: Secondary | ICD-10-CM | POA: Insufficient documentation

## 2018-10-29 DIAGNOSIS — Y92009 Unspecified place in unspecified non-institutional (private) residence as the place of occurrence of the external cause: Secondary | ICD-10-CM | POA: Diagnosis not present

## 2018-10-29 DIAGNOSIS — O99332 Smoking (tobacco) complicating pregnancy, second trimester: Secondary | ICD-10-CM | POA: Diagnosis not present

## 2018-10-29 DIAGNOSIS — S61213A Laceration without foreign body of left middle finger without damage to nail, initial encounter: Secondary | ICD-10-CM | POA: Diagnosis not present

## 2018-10-29 DIAGNOSIS — S61214A Laceration without foreign body of right ring finger without damage to nail, initial encounter: Secondary | ICD-10-CM | POA: Insufficient documentation

## 2018-10-29 MED ORDER — ACETAMINOPHEN 325 MG PO TABS
650.0000 mg | ORAL_TABLET | Freq: Once | ORAL | Status: AC
  Administered 2018-10-29: 650 mg via ORAL
  Filled 2018-10-29: qty 2

## 2018-10-29 MED ORDER — TETANUS-DIPHTH-ACELL PERTUSSIS 5-2.5-18.5 LF-MCG/0.5 IM SUSP
0.5000 mL | Freq: Once | INTRAMUSCULAR | Status: DC
  Filled 2018-10-29: qty 0.5

## 2018-10-29 MED ORDER — CEPHALEXIN 500 MG PO CAPS: 500.0000 mg | ORAL_CAPSULE | Freq: Three times a day (TID) | ORAL | 0 refills | Status: DC

## 2018-10-29 MED ORDER — BACITRACIN ZINC 500 UNIT/GM EX OINT
TOPICAL_OINTMENT | CUTANEOUS | Status: AC
  Administered 2018-10-29: 1
  Filled 2018-10-29: qty 0.9

## 2018-10-29 MED ORDER — LIDOCAINE HCL (PF) 1 % IJ SOLN
10.0000 mL | Freq: Once | INTRAMUSCULAR | Status: AC
  Administered 2018-10-29: 10 mL
  Filled 2018-10-29: qty 10

## 2018-10-29 MED ORDER — MUPIROCIN 2 % EX OINT: TOPICAL_OINTMENT | Freq: Two times a day (BID) | CUTANEOUS | Status: DC

## 2018-10-29 NOTE — ED Provider Notes (Addendum)
[facility] EMERGENCY DEPARTMENT Provider Note   CSN: [id number] Arrival date & time: 10/29/18  1232     History   Chief Complaint Chief Complaint  Patient presents with  . Laceration    HPI Rose Holden is a 27 y.o. female with a hx of tobacco abuse, GC/chlamydia, and depression who presents to the ED for evaluation of finger lacerations x 2 which occurred last night around midnight. Patient states that she cut the fingers (R 5th digit, L 3rd digirt) on some broken glass at her house last night, does not further elaborate on the injuries, states she did not strike/punch anything. States that she cleaned the areas w/ anti-bacterials and her grandmother applied bandages. She states the areas are painful and the bandages now seem to be stuck to the wounds. No alleviating/aggravating factors. Reports some intermittent paresthesias to the L 3rd finger. Denies numbness, weakness, or other areas of injury. States last tetanus was w/ last pregnancy- within 5 years. Patient 5 months pregnant, followed by obstetrician Dr. Emelda Fear, no pregnancy related complaints today. Patient would not elaborate on her injuries, I clarified multiple times that she feels safe at home.   HPI  Past Medical History:  Diagnosis Date  . Bell's palsy   . Chlamydia   . Depression   . Gonorrhea 06/27/2018   Treated POC__________  . Gonorrhea   . Nausea & vomiting 02/17/2016  . Pollen allergies 02/17/2016  . Pregnant 02/17/2016  . Vaginal discharge 02/17/2016    Patient Active Problem List   Diagnosis Date Noted  . Cocaine use complicating pregnancy, antepartum 10/22/2018  . Marijuana use 10/22/2018  . Supervision of normal pregnancy 10/19/2018  . Late prenatal care in second trimester 10/19/2018  . Smoker 10/19/2018  . History of gonorrhea 08/27/2018  . Atypical squamous cell changes of undetermined significance (ASCUS) on cervical cytology with negative high risk human papilloma virus (HPV) test result  03/02/2017  . Pollen allergies 02/17/2016  . MDD (major depressive disorder), single episode 08/15/2014    Past Surgical History:  Procedure Laterality Date  . broken leg     closed reduction  . NO PAST SURGERIES       OB History    Gravida  7   Para  4   Term  4   Preterm      AB  2   Living  4     SAB      TAB  1   Ectopic      Multiple  0   Live Births  4            Home Medications    Prior to Admission medications   Medication Sig Start Date End Date Taking? Authorizing Provider  azithromycin (ZITHROMAX Z-PAK) 250 MG tablet Use as directed 10/19/18   Cheral Marker, CNM  Prenat-FeCbn-FeAsp-Meth-FA-DHA (PRENATE MINI) 18-0.6-0.4-350 MG CAPS Take 1 tablet by mouth daily. 08/27/18   Adline Potter, NP    Family History Family History  Problem Relation Age of Onset  . Hypertension Mother   . Glaucoma Mother   . Cancer Maternal Grandmother     Social History Social History   Tobacco Use  . Smoking status: Current Every Day Smoker    Packs/day: 0.50    Years: 11.00    Pack years: 5.50    Types: Cigarettes  . Smokeless tobacco: Never Used  . Tobacco comment: smokes 10 cig daily  Substance Use Topics  . Alcohol use: Not  Currently  . Drug use: Not Currently    Types: Marijuana     Allergies   Patient has no known allergies.   Review of Systems Review of Systems  Constitutional: Negative for chills and fever.  Skin: Positive for wound.  Neurological: Negative for weakness and numbness.       Positive for intermittent paresthesias to the L 3rd finger     Physical Exam Updated Vital Signs BP 118/90 (BP Location: Left Arm)   Pulse 96   Temp 98 F (36.7 C) (Oral)   Resp 18   Ht 5\' 2"  (1.575 m)   Wt 97.5 kg   LMP  (LMP Unknown)   SpO2 100%   BMI 39.32 kg/m   Physical Exam Vitals signs and nursing note reviewed.  Constitutional:      General: She is not in acute distress.    Appearance: She is well-developed.    HENT:     Head: Normocephalic and atraumatic.  Eyes:     General:        Right eye: No discharge.        Left eye: No discharge.     Conjunctiva/sclera: Conjunctivae normal.  Cardiovascular:     Pulses:          Radial pulses are 2+ on the right side and 2+ on the left side.  Musculoskeletal:     Comments: Upper extremities: Patient has a 2cm gaping linear laceration to the palmar surface of the R 5th digit just distal to the PIP joint. No active bleeding, no appreciable FB. Patient has a gaping 3.5 cm curved laceration to the palmar surface of the L 3rd middle phalanx- starts just distal to the PIP joint and extends to ulnar aspect. No active bleeding, no appreciable FB. Patient has full AROM to the wrists/MCPS/IP joints able to extend/flex against resistance in affected digits. Tender over lacerations otherwise nontender.   Neurological:     Mental Status: She is alert.     Comments: Clear speech. Sensation grossly intact & intact to sharp/dull touch to the upper extremities. 5/5 symmetric grip strength. Able to perform OK sign, thumbs up, and cross 2nd/3rd digits.   Psychiatric:        Behavior: Behavior normal.        Thought Content: Thought content normal.      ED Treatments / Results  Labs (all labs ordered are listed, but only abnormal results are displayed) Labs Reviewed - No data to display  EKG None  Radiology No results found.  Procedures .Marland KitchenLaceration Repair Date/Time: 10/29/2018 4:07 PM Performed by: Cherly Anderson, PA-C Authorized by: Cherly Anderson, PA-C   Consent:    Consent obtained:  Verbal   Consent given by:  Patient   Risks discussed:  Infection, need for additional repair, nerve damage, poor wound healing, poor cosmetic result, pain, retained foreign body, tendon damage and vascular damage   Alternatives discussed:  No treatment Anesthesia (see MAR for exact dosages):    Anesthesia method:  Nerve block   Block needle gauge:  25 G    Block anesthetic:  Lidocaine 1% w/o epi   Block injection procedure:  Anatomic landmarks identified, anatomic landmarks palpated, introduced needle, negative aspiration for blood and incremental injection   Block outcome:  Anesthesia achieved Laceration details:    Location:  Finger   Finger location:  R small finger   Length (cm):  2 Repair type:    Repair type:  Simple Pre-procedure details:  Preparation:  Patient was prepped and draped in usual sterile fashion Exploration:    Hemostasis achieved with:  Direct pressure   Wound exploration: wound explored through full range of motion and entire depth of wound probed and visualized   Treatment:    Area cleansed with:  Betadine   Amount of cleaning:  Extensive   Irrigation solution:  Sterile water   Irrigation method:  Pressure wash   Visualized foreign bodies/material removed: no   Skin repair:    Repair method:  Sutures   Suture size:  4-0   Suture material:  Nylon   Suture technique:  Simple interrupted   Number of sutures:  3 Approximation:    Approximation:  Loose Post-procedure details:    Dressing:  Antibiotic ointment, splint for protection and non-adherent dressing   Patient tolerance of procedure:  Tolerated well, no immediate complications .Marland Kitchen.Laceration Repair Date/Time: 10/29/2018 4:08 PM Performed by: Cherly AndersonPetrucelli, Jere Bostrom R, PA-C Authorized by: Cherly AndersonPetrucelli, Chrishauna Mee R, PA-C   Consent:    Consent obtained:  Verbal   Consent given by:  Patient   Risks discussed:  Infection, need for additional repair, nerve damage, poor wound healing, poor cosmetic result, pain, retained foreign body, tendon damage and vascular damage   Alternatives discussed:  No treatment Anesthesia (see MAR for exact dosages):    Anesthesia method:  Nerve block   Block needle gauge:  27 G   Block anesthetic:  Lidocaine 1% w/o epi   Block injection procedure:  Anatomic landmarks identified, anatomic landmarks palpated, negative aspiration for  blood, introduced needle and incremental injection   Block outcome:  Anesthesia achieved Laceration details:    Location:  Finger   Finger location:  L long finger   Length (cm):  3.5 Repair type:    Repair type:  Simple Pre-procedure details:    Preparation:  Patient was prepped and draped in usual sterile fashion Exploration:    Hemostasis achieved with:  Direct pressure   Wound exploration: wound explored through full range of motion and entire depth of wound probed and visualized   Treatment:    Area cleansed with:  Betadine   Amount of cleaning:  Extensive   Irrigation solution:  Sterile water   Irrigation method:  Pressure wash Skin repair:    Repair method:  Sutures   Suture size:  4-0   Suture material:  Nylon   Suture technique:  Simple interrupted   Number of sutures:  3 Approximation:    Approximation:  Loose Post-procedure details:    Dressing:  Antibiotic ointment, non-adherent dressing and splint for protection   Patient tolerance of procedure:  Tolerated well, no immediate complications   (including critical care time)  SPLINT APPLICATION Date/Time: 4:21 PM Authorized by: Harvie HeckSamantha Keelan Pomerleau Consent: Verbal consent obtained. Risks and benefits: risks, benefits and alternatives were discussed Consent given by: patient Splint applied by: RN Location details: L 3rd finger, R 5th finger Splint type: aluminum splints Post-procedure: The splinted body part was neurovascularly unchanged following the procedure. Patient tolerance: Patient tolerated the procedure well with no immediate complications.    Medications Ordered in ED Medications - No data to display   Initial Impression / Assessment and Plan / ED Course  I have reviewed the triage vital signs and the nursing notes.  Pertinent labs & imaging results that were available during my care of the patient were reviewed by me and considered in my medical decision making (see chart for details).     Patient presents to  the emergency department with lacerations to the L 3rd finger and R 5th finger which occurred approximately 15 hours prior to my assessment.  Patient nontoxic appearing, resting comfortably. Discussed Xray to evaluate for underlying fx/dislocation, patient refused and did not feel this was necessary. She is NVI distally to each of the wounds, has good strength with flexion/extension of MCP/IP joints therefore doubt significant tendon injury. Pressure irrigation performed. Wounds explored and base of wounds visualized in a bloodless field without evidence of foreign body. Given gaping appearance of laceration do feel that they would benefit from loose closure with sutures for reapproximation. Lacerations repaired per procedure notes above, tolerated well, L 3rd digit laceration very loosely closed, patient refused more than 3 sutures to this wound therefore only 3 were placed with approximation best possible. Tetanus is up to date. Will place on Keflex given delayed closure. Discussed suture home care as well as need for wound recheck and suture removal. I discussed  treatment plan, need for follow-up, and return precautions with the patient including signs of infection. Provided opportunity for questions, patient confirmed understanding and is in agreement with plan.    Findings and plan of care discussed with supervising physician Dr. Jodi Mourning who personally evaluated and examined this patient and is in agreement.    Final Clinical Impressions(s) / ED Diagnoses   Final diagnoses:  Laceration of right little finger without foreign body without damage to nail, initial encounter  Laceration of left middle finger without foreign body without damage to nail, initial encounter    ED Discharge Orders         Ordered    cephALEXin (KEFLEX) 500 MG capsule  3 times daily,   Status:  Discontinued     10/29/18 1611    cephALEXin (KEFLEX) 500 MG capsule  3 times daily     10/29/18 [address], PA-C 10/29/18 1622    Blane Ohara, MD 10/31/18 [address], [place], PA-C 11/01/18 1610    Blane Ohara, MD 11/02/18 [phone]

## 2018-10-29 NOTE — Discharge Instructions (Addendum)
You were seen in the emergency department today for lacerations to the left 3rd finger and the right 5th fingers. Your laceration was closed with 3 stitches each. Please keep this area clean and dry for the next 24 hours, after 24 hours you may get this area wet, but avoid soaking the area. Keep the area covered as best possible especially when in the sun to help in minimizing scarring. Utilize the splints to prevent excess movement of the wounds. We are also placing you on Keflex- an antibiotic to help prevent infection- take this as prescribed.   We have prescribed you new medication(s) today. Discuss the medications prescribed today with your pharmacist as they can have adverse effects and interactions with your other medicines including over the counter and prescribed medications. Seek medical evaluation if you start to experience new or abnormal symptoms after taking one of these medicines, seek care immediately if you start to experience difficulty breathing, feeling of your throat closing, facial swelling, or rash as these could be indications of a more serious allergic reaction  Take tylenol as needed for pain   You will need to have the stitches removed and the wound rechecked in 7 days. Please return to the emergency department, go to an urgent care, or see your primary care provider to have this performed. Return to the ER soon should you start to experience pus type drainage from the wound, redness around the wound, or fevers as this could indicate the area is infected, please return to the ER for any other worsening symptoms or concerns that you may have.

## 2018-10-29 NOTE — ED Triage Notes (Addendum)
Pt c/o lacerations to LT middle finger and RT pink finger from glass that happened last night. Bleeding controlled at this time. Pt approx 5 months pregnant.

## 2018-11-14 NOTE — L&D Delivery Note (Signed)
Patient: Madaline D Lees MRN: [id number]  GBS status: Positive, IAP given: PCN   Patient is a 28 y.o. now G7P5 s/p NSVD at [redacted]w[redacted]d, who was admitted for IOL for postdates. AROM 6h 62m prior to delivery with pink tinged fluid.    Delivery Note At 11:38 PM a viable female was delivered via Vaginal, Spontaneous (Presentation: OA ).  APGAR: 7, 9; weight 7 lb 10.2 oz (3464 g).   Placenta status: spontaneous, intact.  Cord: 3 vessel with the following complications: nuchal cord x1 and body cord.   Anesthesia:  Epidural  Episiotomy: None Lacerations: None Suture Repair: None Est. Blood Loss (mL):  111  Delivery call prior to delivery of infant due to recurrent deep variables. Head delivered OA. Nuchal cord present x1, reduced at perineum. Shoulder and body delivered in usual fashion with reduction of body cord. Infant with diminished tone and respiratory infant, dried and bulb suctioned. Cord clamped x 2 after 30 second delay, and infant taken to warmer with immediate spontaneous cry. Cord blood drawn. Placenta delivered spontaneously with gentle cord traction. Fundus firm with massage and Pitocin. Perineum inspected and found to have no lacerations.  Mom to postpartum.  Baby to Couplet care / Skin to Skin.  De Hollingshead 03/17/2019, 12:03 AM

## 2018-11-16 ENCOUNTER — Encounter: Payer: Medicaid Other | Admitting: Obstetrics and Gynecology

## 2018-11-19 ENCOUNTER — Emergency Department (HOSPITAL_COMMUNITY)
Admission: EM | Admit: 2018-11-19 | Discharge: 2018-11-19 | Disposition: A | Discharge status: Home / Self Care | Payer: Medicaid Other | Attending: Emergency Medicine | Admitting: Emergency Medicine

## 2018-11-19 ENCOUNTER — Other Ambulatory Visit: Payer: Self-pay

## 2018-11-19 ENCOUNTER — Encounter (HOSPITAL_COMMUNITY): Payer: Self-pay

## 2018-11-19 DIAGNOSIS — Z5321 Procedure and treatment not carried out due to patient leaving prior to being seen by health care provider: Secondary | ICD-10-CM | POA: Insufficient documentation

## 2018-11-19 DIAGNOSIS — Z4801 Encounter for change or removal of surgical wound dressing: Secondary | ICD-10-CM | POA: Insufficient documentation

## 2018-11-19 NOTE — ED Triage Notes (Signed)
Pt removed stitches from left  Middle finger yesterday and is concerned it is infected. Finger swollen and painful

## 2018-11-19 NOTE — ED Notes (Signed)
3rd call no answer.

## 2018-11-26 ENCOUNTER — Ambulatory Visit (INDEPENDENT_AMBULATORY_CARE_PROVIDER_SITE_OTHER): Payer: Medicaid Other | Admitting: Advanced Practice Midwife

## 2018-11-26 VITALS — BP 111/73 | HR 75 | Wt 228.0 lb

## 2018-11-26 DIAGNOSIS — Z3A25 25 weeks gestation of pregnancy: Secondary | ICD-10-CM

## 2018-11-26 DIAGNOSIS — Z3482 Encounter for supervision of other normal pregnancy, second trimester: Secondary | ICD-10-CM

## 2018-11-26 NOTE — Patient Instructions (Signed)

## 2018-11-26 NOTE — Progress Notes (Signed)
  [id number] [redacted]w[redacted]d Estimated Date of Delivery: 03/09/19  Blood pressure 111/73, pulse 75, weight 228 lb (103.4 kg).   BP weight and urine results all reviewed and noted.  Please refer to the obstetrical flow sheet for the fundal height and fetal heart rate documentation:  Patient reports good fetal movement, denies any bleeding and no rupture of membranes symptoms or regular contractions. Patient had stitches in right hand 12/16, took them out herself.  4th finger has some puffiness,no erythema or drainage. PT sure she got all 3 stitches out. Still tender/numb.  Pinky finger won''t bend on command, but is bendable. Seems tendon is probably damaged.  All questions were answered.   Physical Assessment:   Vitals:   11/26/18 1429  BP: 111/73  Pulse: 75  Weight: 228 lb (103.4 kg)  Body mass index is 41.7 kg/m.        Physical Examination:   General appearance: Well appearing, and in no distress  Mental status: Alert, oriented to person, place, and time  Skin: Warm & dry  Cardiovascular: Normal heart rate noted  Respiratory: Normal respiratory effort, no distress  Abdomen: Soft, gravid, nontender  Pelvic: Cervical exam deferred         Extremities: Edema: None  Co exam w/Dr. Alvester Morin. Just fluid, not infected. Wrap/compress to facilitate drainage Fetal Status: Fetal Heart Rate (bpm): 148 Fundal Height: 24 cm Movement: Present    No results found for this or any previous visit (from the past 24 hour(s)).   No orders of the defined types were placed in this encounter.   Plan:  Continued routine obstetrical care  Return in about 3 weeks (around 12/17/2018) for PN2/LROB, sign BTL form.

## 2018-12-19 ENCOUNTER — Encounter: Payer: Medicaid Other | Admitting: Women's Health

## 2018-12-19 ENCOUNTER — Other Ambulatory Visit: Payer: Medicaid Other

## 2018-12-24 ENCOUNTER — Other Ambulatory Visit: Payer: Medicaid Other

## 2018-12-24 ENCOUNTER — Encounter: Payer: Medicaid Other | Admitting: Advanced Practice Midwife

## 2018-12-25 ENCOUNTER — Other Ambulatory Visit: Payer: Medicaid Other

## 2018-12-26 ENCOUNTER — Other Ambulatory Visit: Payer: Medicaid Other

## 2018-12-26 DIAGNOSIS — Z3A28 28 weeks gestation of pregnancy: Secondary | ICD-10-CM | POA: Diagnosis not present

## 2018-12-26 DIAGNOSIS — Z3480 Encounter for supervision of other normal pregnancy, unspecified trimester: Secondary | ICD-10-CM | POA: Diagnosis not present

## 2018-12-27 LAB — CBC
HEMOGLOBIN: 9.8 g/dL — AB (ref 11.1–15.9)
Hematocrit: 29.3 % — ABNORMAL LOW (ref 34.0–46.6)
MCH: 29.4 pg (ref 26.6–33.0)
MCHC: 33.4 g/dL (ref 31.5–35.7)
MCV: 88 fL (ref 79–97)
Platelets: 286 10*3/uL (ref 150–450)
RBC: 3.33 x10E6/uL — ABNORMAL LOW (ref 3.77–5.28)
RDW: 12.9 % (ref 11.7–15.4)
WBC: 7.1 10*3/uL (ref 3.4–10.8)

## 2018-12-27 LAB — RPR: RPR Ser Ql: NONREACTIVE

## 2018-12-27 LAB — GLUCOSE TOLERANCE, 2 HOURS W/ 1HR
Glucose, 1 hour: 137 mg/dL (ref 65–179)
Glucose, 2 hour: 100 mg/dL (ref 65–152)
Glucose, Fasting: 88 mg/dL (ref 65–91)

## 2018-12-27 LAB — ANTIBODY SCREEN: Antibody Screen: NEGATIVE

## 2018-12-27 LAB — HIV ANTIBODY (ROUTINE TESTING W REFLEX): HIV Screen 4th Generation wRfx: NONREACTIVE

## 2019-01-01 ENCOUNTER — Other Ambulatory Visit: Payer: Self-pay | Admitting: Women's Health

## 2019-01-01 ENCOUNTER — Telehealth: Payer: Self-pay | Admitting: *Deleted

## 2019-01-01 MED ORDER — FERROUS SULFATE 325 (65 FE) MG PO TABS: 325.0000 mg | ORAL_TABLET | Freq: Two times a day (BID) | ORAL | 3 refills | Status: DC

## 2019-01-01 NOTE — Telephone Encounter (Signed)
Patient informed she is anemic, rx sent in for iron to her pharmacy and to make sure she's taking pnv daily, increase fe-rich foods: red meats, green leafy vegs, beans. Pt verbalized understanding with no further questions.

## 2019-01-02 ENCOUNTER — Encounter: Payer: Self-pay | Admitting: Advanced Practice Midwife

## 2019-01-02 ENCOUNTER — Ambulatory Visit (INDEPENDENT_AMBULATORY_CARE_PROVIDER_SITE_OTHER): Payer: Medicaid Other | Admitting: Advanced Practice Midwife

## 2019-01-02 VITALS — BP 124/73 | HR 95 | Wt 231.0 lb

## 2019-01-02 DIAGNOSIS — Z3A30 30 weeks gestation of pregnancy: Secondary | ICD-10-CM

## 2019-01-02 DIAGNOSIS — Z331 Pregnant state, incidental: Secondary | ICD-10-CM

## 2019-01-02 DIAGNOSIS — Z1389 Encounter for screening for other disorder: Secondary | ICD-10-CM

## 2019-01-02 DIAGNOSIS — Z202 Contact with and (suspected) exposure to infections with a predominantly sexual mode of transmission: Secondary | ICD-10-CM

## 2019-01-02 DIAGNOSIS — Z3483 Encounter for supervision of other normal pregnancy, third trimester: Secondary | ICD-10-CM | POA: Diagnosis not present

## 2019-01-02 DIAGNOSIS — F1490 Cocaine use, unspecified, uncomplicated: Secondary | ICD-10-CM

## 2019-01-02 DIAGNOSIS — O99323 Drug use complicating pregnancy, third trimester: Secondary | ICD-10-CM

## 2019-01-02 DIAGNOSIS — O99320 Drug use complicating pregnancy, unspecified trimester: Secondary | ICD-10-CM

## 2019-01-02 LAB — POCT URINALYSIS DIPSTICK OB
Blood, UA: NEGATIVE
Glucose, UA: NEGATIVE
NITRITE UA: NEGATIVE

## 2019-01-02 MED ORDER — AZITHROMYCIN 500 MG PO TABS: 1000.0000 mg | ORAL_TABLET | Freq: Once | ORAL | 0 refills | Status: AC

## 2019-01-02 MED ORDER — CEFTRIAXONE SODIUM 250 MG IJ SOLR
250.0000 mg | Freq: Once | INTRAMUSCULAR | Status: AC
  Administered 2019-01-02: 250 mg via INTRAMUSCULAR

## 2019-01-02 MED ORDER — CEFTRIAXONE SODIUM 250 MG IJ SOLR: 250.0000 mg | Freq: Once | INTRAMUSCULAR | Status: DC

## 2019-01-02 NOTE — Progress Notes (Signed)
  [id number] [redacted]w[redacted]d Estimated Date of Delivery: 03/09/19  Blood pressure 124/73, pulse 95, weight 231 lb (104.8 kg).    Missed last 2 appts. Boyfriend was treated for GC.    BP weight and urine results all reviewed and noted.  Please refer to the obstetrical flow sheet for the fundal height and fetal heart rate documentation:  Patient reports good fetal movement, denies any bleeding and no rupture of membranes symptoms or regular contractions. Patient is without complaints. All questions were answered.   Physical Assessment:   Vitals:   01/02/19 1124  BP: 124/73  Pulse: 95  Weight: 231 lb (104.8 kg)  Body mass index is 42.25 kg/m.        Physical Examination:   General appearance: Well appearing, and in no distress  Mental status: Alert, oriented to person, place, and time  Skin: Warm & dry  Cardiovascular: Normal heart rate noted  Respiratory: Normal respiratory effort, no distress  Abdomen: Soft, gravid, nontender  Pelvic: Cervical exam deferred         Extremities: Edema: None  Fetal Status: Fetal Heart Rate (bpm): 145 Fundal Height: 29 cm Movement: Present    No results found for this or any previous visit (from the past 24 hour(s)).   Orders Placed This Encounter  Procedures  . GC/Chlamydia Probe Amp  . Trichomonas vaginalis, RNA    Plan:  Continued routine obstetrical care,  Rocephin 250mg  IM and azithromycin 1gm PO today.   Return in about 2 weeks (around 01/16/2019) for LROB.

## 2019-01-02 NOTE — Addendum Note (Signed)
Addended by: Federico Flake A on: 01/02/2019 12:13 PM   Modules accepted: Orders

## 2019-01-02 NOTE — Patient Instructions (Signed)
Rose Holden, I greatly value your feedback.  If you receive a survey following your visit with [place] today, we appreciate you taking the time to fill it out.  Thanks, Cathie Beams, CNM   Call the office [phone]) or go to [facility] if:  You begin to have strong, frequent contractions  Your water breaks.  Sometimes it is a big gush of fluid, sometimes it is just a trickle that keeps getting your panties wet or running down your legs  You have vaginal bleeding.  It is normal to have a small amount of spotting if your cervix was checked.   You don't feel your baby moving like normal.  If you don't, get you something to eat and drink and lay down and focus on feeling your baby move.  You should feel at least 10 movements in 2 hours.  If you don't, you should call the office or go to [facility].    Tdap Vaccine  It is recommended that you get the Tdap vaccine during the third trimester of EACH pregnancy to help protect your baby from getting pertussis (whooping cough)  27-36 weeks is the BEST time to do this so that you can pass the protection on to your baby. During pregnancy is better than after pregnancy, but if you are unable to get it during pregnancy it will be offered at the hospital.   You will be offered this vaccine in the office after 27 weeks. If you do not have health insurance, you can get this vaccine at the health department or your family doctor  Everyone who will be around your baby should also be up-to-date on their vaccines. Adults (who are not pregnant) only need 1 dose of Tdap during adulthood.   Third Trimester of Pregnancy The third trimester is from week 29 through week 42, months 7 through 9. The third trimester is a time when the fetus is growing rapidly. At the end of the ninth month, the fetus is about 20 inches in length and weighs 6-10 pounds.  BODY CHANGES Your body goes through many changes during pregnancy. The changes vary from woman to  woman.   Your weight will continue to increase. You can expect to gain 25-35 pounds (11-16 kg) by the end of the pregnancy.  You may begin to get stretch marks on your hips, abdomen, and breasts.  You may urinate more often because the fetus is moving lower into your pelvis and pressing on your bladder.  You may develop or continue to have heartburn as a result of your pregnancy.  You may develop constipation because certain hormones are causing the muscles that push waste through your intestines to slow down.  You may develop hemorrhoids or swollen, bulging veins (varicose veins).  You may have pelvic pain because of the weight gain and pregnancy hormones relaxing your joints between the bones in your pelvis. Backaches may result from overexertion of the muscles supporting your posture.  You may have changes in your hair. These can include thickening of your hair, rapid growth, and changes in texture. Some women also have hair loss during or after pregnancy, or hair that feels dry or thin. Your hair will most likely return to normal after your baby is born.  Your breasts will continue to grow and be tender. A yellow discharge may leak from your breasts called colostrum.  Your belly button may stick out.  You may feel short of breath because of your expanding uterus.  You may notice the fetus "dropping," or moving lower in your abdomen.  You may have a bloody mucus discharge. This usually occurs a few days to a week before labor begins.  Your cervix becomes thin and soft (effaced) near your due date. WHAT TO EXPECT AT YOUR PRENATAL EXAMS  You will have prenatal exams every 2 weeks until week 36. Then, you will have weekly prenatal exams. During a routine prenatal visit:  You will be weighed to make sure you and the fetus are growing normally.  Your blood pressure is taken.  Your abdomen will be measured to track your baby's growth.  The fetal heartbeat will be listened  to.  Any test results from the previous visit will be discussed.  You may have a cervical check near your due date to see if you have effaced. At around 36 weeks, your caregiver will check your cervix. At the same time, your caregiver will also perform a test on the secretions of the vaginal tissue. This test is to determine if a type of bacteria, Group B streptococcus, is present. Your caregiver will explain this further. Your caregiver may ask you:  What your birth plan is.  How you are feeling.  If you are feeling the baby move.  If you have had any abnormal symptoms, such as leaking fluid, bleeding, severe headaches, or abdominal cramping.  If you have any questions. Other tests or screenings that may be performed during your third trimester include:  Blood tests that check for low iron levels (anemia).  Fetal testing to check the health, activity level, and growth of the fetus. Testing is done if you have certain medical conditions or if there are problems during the pregnancy. FALSE LABOR You may feel small, irregular contractions that eventually go away. These are called Braxton Hicks contractions, or false labor. Contractions may last for hours, days, or even weeks before true labor sets in. If contractions come at regular intervals, intensify, or become painful, it is best to be seen by your caregiver.  SIGNS OF LABOR   Menstrual-like cramps.  Contractions that are 5 minutes apart or less.  Contractions that start on the top of the uterus and spread down to the lower abdomen and back.  A sense of increased pelvic pressure or back pain.  A watery or bloody mucus discharge that comes from the vagina. If you have any of these signs before the 37th week of pregnancy, call your caregiver right away. You need to go to the hospital to get checked immediately. HOME CARE INSTRUCTIONS   Avoid all smoking, herbs, alcohol, and unprescribed drugs. These chemicals affect the  formation and growth of the baby.  Follow your caregiver's instructions regarding medicine use. There are medicines that are either safe or unsafe to take during pregnancy.  Exercise only as directed by your caregiver. Experiencing uterine cramps is a good sign to stop exercising.  Continue to eat regular, healthy meals.  Wear a good support bra for breast tenderness.  Do not use hot tubs, steam rooms, or saunas.  Wear your seat belt at all times when driving.  Avoid raw meat, uncooked cheese, cat litter boxes, and soil used by cats. These carry germs that can cause birth defects in the baby.  Take your prenatal vitamins.  Try taking a stool softener (if your caregiver approves) if you develop constipation. Eat more high-fiber foods, such as fresh vegetables or fruit and whole grains. Drink plenty of fluids to keep your urine  clear or pale yellow.  Take warm sitz baths to soothe any pain or discomfort caused by hemorrhoids. Use hemorrhoid cream if your caregiver approves.  If you develop varicose veins, wear support hose. Elevate your feet for 15 minutes, 3-4 times a day. Limit salt in your diet.  Avoid heavy lifting, wear low heal shoes, and practice good posture.  Rest a lot with your legs elevated if you have leg cramps or low back pain.  Visit your dentist if you have not gone during your pregnancy. Use a soft toothbrush to brush your teeth and be gentle when you floss.  A sexual relationship may be continued unless your caregiver directs you otherwise.  Do not travel far distances unless it is absolutely necessary and only with the approval of your caregiver.  Take prenatal classes to understand, practice, and ask questions about the labor and delivery.  Make a trial run to the hospital.  Pack your hospital bag.  Prepare the baby's nursery.  Continue to go to all your prenatal visits as directed by your caregiver. SEEK MEDICAL CARE IF:  You are unsure if you are in  labor or if your water has broken.  You have dizziness.  You have mild pelvic cramps, pelvic pressure, or nagging pain in your abdominal area.  You have persistent nausea, vomiting, or diarrhea.  You have a bad smelling vaginal discharge.  You have pain with urination. SEEK IMMEDIATE MEDICAL CARE IF:   You have a fever.  You are leaking fluid from your vagina.  You have spotting or bleeding from your vagina.  You have severe abdominal cramping or pain.  You have rapid weight loss or gain.  You have shortness of breath with chest pain.  You notice sudden or extreme swelling of your face, hands, ankles, feet, or legs.  You have not felt your baby move in over an hour.  You have severe headaches that do not go away with medicine.  You have vision changes. Document Released: 10/25/2001 Document Revised: 11/05/2013 Document Reviewed: 01/01/2013 [facility] Patient Information 2015 [place], [place]. This information is not intended to replace advice given to you by your health care provider. Make sure you discuss any questions you have with your health care provider.

## 2019-01-06 ENCOUNTER — Encounter: Payer: Self-pay | Admitting: Advanced Practice Midwife

## 2019-01-06 DIAGNOSIS — A549 Gonococcal infection, unspecified: Secondary | ICD-10-CM | POA: Insufficient documentation

## 2019-01-09 LAB — GC/CHLAMYDIA PROBE AMP
Chlamydia trachomatis, NAA: NEGATIVE
NEISSERIA GONORRHOEAE BY PCR: POSITIVE — AB

## 2019-01-09 LAB — TRICHOMONAS VAGINALIS, PROBE AMP: Trich vag by NAA: NEGATIVE

## 2019-01-16 ENCOUNTER — Encounter: Payer: Self-pay | Admitting: Women's Health

## 2019-01-16 ENCOUNTER — Ambulatory Visit (INDEPENDENT_AMBULATORY_CARE_PROVIDER_SITE_OTHER): Payer: Medicaid Other | Admitting: Women's Health

## 2019-01-16 VITALS — BP 136/79 | HR 109 | Wt 229.5 lb

## 2019-01-16 DIAGNOSIS — Z3A32 32 weeks gestation of pregnancy: Secondary | ICD-10-CM

## 2019-01-16 DIAGNOSIS — Z3483 Encounter for supervision of other normal pregnancy, third trimester: Secondary | ICD-10-CM

## 2019-01-16 NOTE — Progress Notes (Signed)
   LOW-RISK PREGNANCY VISIT Patient name: Rose Holden MRN [id number]  Date of birth: 08/12/1991 Chief Complaint:   Routine Prenatal Visit  History of Present Illness:   Rose Holden is a 28 y.o. [id number] female at [redacted]w[redacted]d with an Estimated Date of Delivery: 03/09/19 being seen today for ongoing management of a low-risk pregnancy.  Today she reports no complaints. Contractions: Not present. Vag. Bleeding: None.  Movement: Present. denies leaking of fluid. Review of Systems:   Pertinent items are noted in HPI Denies abnormal vaginal discharge w/ itching/odor/irritation, headaches, visual changes, shortness of breath, chest pain, abdominal pain, severe nausea/vomiting, or problems with urination or bowel movements unless otherwise stated above. Pertinent History Reviewed:  Reviewed past medical,surgical, social, obstetrical and family history.  Reviewed problem list, medications and allergies. Physical Assessment:   Vitals:   01/16/19 1202  BP: 136/79  Pulse: (!) 109  Weight: 229 lb 8 oz (104.1 kg)  Body mass index is 41.98 kg/m.        Physical Examination:   General appearance: Well appearing, and in no distress  Mental status: Alert, oriented to person, place, and time  Skin: Warm & dry  Cardiovascular: Normal heart rate noted  Respiratory: Normal respiratory effort, no distress  Abdomen: Soft, gravid, nontender  Pelvic: Cervical exam deferred         Extremities: Edema: None  Fetal Status: Fetal Heart Rate (bpm): 140 Fundal Height: 33 cm Movement: Present    No results found for this or any previous visit (from the past 24 hour(s)).  Assessment & Plan:  1) Low-risk pregnancy [id number] at [redacted]w[redacted]d with an Estimated Date of Delivery: 03/09/19   2) Recent GC+, Tx 2wks ago, too early for POC   Meds: No orders of the defined types were placed in this encounter.  Labs/procedures today: declined tdap, unable to void for urine  Plan:  Continue routine obstetrical care   Reviewed:  Preterm labor symptoms and general obstetric precautions including but not limited to vaginal bleeding, contractions, leaking of fluid and fetal movement were reviewed in detail with the patient.  All questions were answered  Follow-up: Return in about 2 weeks (around 01/30/2019) for LROB. & POC  No orders of the defined types were placed in this encounter.  Cheral Marker CNM, [facility] 01/16/2019 12:38 PM

## 2019-01-16 NOTE — Patient Instructions (Signed)
Rose Holden, I greatly value your feedback.  If you receive a survey following your visit with [place] today, we appreciate you taking the time to fill it out.  Thanks, Joellyn Haff, CNM, [facility]  [facility] HAS MOVED!!! It is now [facility] ([address] [place], [place] [address]) Entrance located off of [address] Free 24/7 valet parking     Call the office [phone]) or go to [facility] if:  You begin to have strong, frequent contractions  Your water breaks.  Sometimes it is a big gush of fluid, sometimes it is just a trickle that keeps getting your panties wet or running down your legs  You have vaginal bleeding.  It is normal to have a small amount of spotting if your cervix was checked.   You don't feel your baby moving like normal.  If you don't, get you something to eat and drink and lay down and focus on feeling your baby move.  You should feel at least 10 movements in 2 hours.  If you don't, you should call the office or go to [facility].    Tdap Vaccine  It is recommended that you get the Tdap vaccine during the third trimester of EACH pregnancy to help protect your baby from getting pertussis (whooping cough)  27-36 weeks is the BEST time to do this so that you can pass the protection on to your baby. During pregnancy is better than after pregnancy, but if you are unable to get it during pregnancy it will be offered at the hospital.   You can get this vaccine with [place], at the health department, your family doctor, or some local pharmacies  Everyone who will be around your baby should also be up-to-date on their vaccines before the baby comes. Adults (who are not pregnant) only need 1 dose of Tdap during adulthood.   Third Trimester of Pregnancy The third trimester is from week 29 through week 42, months 7 through 9. The third trimester is a time when the fetus is growing rapidly. At the end of the ninth month, the fetus is  about 20 inches in length and weighs 6-10 pounds.  BODY CHANGES Your body goes through many changes during pregnancy. The changes vary from woman to woman.   Your weight will continue to increase. You can expect to gain 25-35 pounds (11-16 kg) by the end of the pregnancy.  You may begin to get stretch marks on your hips, abdomen, and breasts.  You may urinate more often because the fetus is moving lower into your pelvis and pressing on your bladder.  You may develop or continue to have heartburn as a result of your pregnancy.  You may develop constipation because certain hormones are causing the muscles that push waste through your intestines to slow down.  You may develop hemorrhoids or swollen, bulging veins (varicose veins).  You may have pelvic pain because of the weight gain and pregnancy hormones relaxing your joints between the bones in your pelvis. Backaches may result from overexertion of the muscles supporting your posture.  You may have changes in your hair. These can include thickening of your hair, rapid growth, and changes in texture. Some women also have hair loss during or after pregnancy, or hair that feels dry or thin. Your hair will most likely return to normal after your baby is born.  Your breasts will continue to grow and be tender. A yellow discharge may  leak from your breasts called colostrum.  Your belly button may stick out.  You may feel short of breath because of your expanding uterus.  You may notice the fetus "dropping," or moving lower in your abdomen.  You may have a bloody mucus discharge. This usually occurs a few days to a week before labor begins.  Your cervix becomes thin and soft (effaced) near your due date. WHAT TO EXPECT AT YOUR PRENATAL EXAMS  You will have prenatal exams every 2 weeks until week 36. Then, you will have weekly prenatal exams. During a routine prenatal visit:  You will be weighed to make sure you and the fetus are growing  normally.  Your blood pressure is taken.  Your abdomen will be measured to track your baby's growth.  The fetal heartbeat will be listened to.  Any test results from the previous visit will be discussed.  You may have a cervical check near your due date to see if you have effaced. At around 36 weeks, your caregiver will check your cervix. At the same time, your caregiver will also perform a test on the secretions of the vaginal tissue. This test is to determine if a type of bacteria, Group B streptococcus, is present. Your caregiver will explain this further. Your caregiver may ask you:  What your birth plan is.  How you are feeling.  If you are feeling the baby move.  If you have had any abnormal symptoms, such as leaking fluid, bleeding, severe headaches, or abdominal cramping.  If you have any questions. Other tests or screenings that may be performed during your third trimester include:  Blood tests that check for low iron levels (anemia).  Fetal testing to check the health, activity level, and growth of the fetus. Testing is done if you have certain medical conditions or if there are problems during the pregnancy. FALSE LABOR You may feel small, irregular contractions that eventually go away. These are called Braxton Hicks contractions, or false labor. Contractions may last for hours, days, or even weeks before true labor sets in. If contractions come at regular intervals, intensify, or become painful, it is best to be seen by your caregiver.  SIGNS OF LABOR   Menstrual-like cramps.  Contractions that are 5 minutes apart or less.  Contractions that start on the top of the uterus and spread down to the lower abdomen and back.  A sense of increased pelvic pressure or back pain.  A watery or bloody mucus discharge that comes from the vagina. If you have any of these signs before the 37th week of pregnancy, call your caregiver right away. You need to go to the hospital to  get checked immediately. HOME CARE INSTRUCTIONS   Avoid all smoking, herbs, alcohol, and unprescribed drugs. These chemicals affect the formation and growth of the baby.  Follow your caregiver's instructions regarding medicine use. There are medicines that are either safe or unsafe to take during pregnancy.  Exercise only as directed by your caregiver. Experiencing uterine cramps is a good sign to stop exercising.  Continue to eat regular, healthy meals.  Wear a good support bra for breast tenderness.  Do not use hot tubs, steam rooms, or saunas.  Wear your seat belt at all times when driving.  Avoid raw meat, uncooked cheese, cat litter boxes, and soil used by cats. These carry germs that can cause birth defects in the baby.  Take your prenatal vitamins.  Try taking a stool softener (if your caregiver  approves) if you develop constipation. Eat more high-fiber foods, such as fresh vegetables or fruit and whole grains. Drink plenty of fluids to keep your urine clear or pale yellow.  Take warm sitz baths to soothe any pain or discomfort caused by hemorrhoids. Use hemorrhoid cream if your caregiver approves.  If you develop varicose veins, wear support hose. Elevate your feet for 15 minutes, 3-4 times a day. Limit salt in your diet.  Avoid heavy lifting, wear low heal shoes, and practice good posture.  Rest a lot with your legs elevated if you have leg cramps or low back pain.  Visit your dentist if you have not gone during your pregnancy. Use a soft toothbrush to brush your teeth and be gentle when you floss.  A sexual relationship may be continued unless your caregiver directs you otherwise.  Do not travel far distances unless it is absolutely necessary and only with the approval of your caregiver.  Take prenatal classes to understand, practice, and ask questions about the labor and delivery.  Make a trial run to the hospital.  Pack your hospital bag.  Prepare the baby's  nursery.  Continue to go to all your prenatal visits as directed by your caregiver. SEEK MEDICAL CARE IF:  You are unsure if you are in labor or if your water has broken.  You have dizziness.  You have mild pelvic cramps, pelvic pressure, or nagging pain in your abdominal area.  You have persistent nausea, vomiting, or diarrhea.  You have a bad smelling vaginal discharge.  You have pain with urination. SEEK IMMEDIATE MEDICAL CARE IF:   You have a fever.  You are leaking fluid from your vagina.  You have spotting or bleeding from your vagina.  You have severe abdominal cramping or pain.  You have rapid weight loss or gain.  You have shortness of breath with chest pain.  You notice sudden or extreme swelling of your face, hands, ankles, feet, or legs.  You have not felt your baby move in over an hour.  You have severe headaches that do not go away with medicine.  You have vision changes. Document Released: 10/25/2001 Document Revised: 11/05/2013 Document Reviewed: 01/01/2013 [facility] Patient Information 2015 [place], [place]. This information is not intended to replace advice given to you by your health care provider. Make sure you discuss any questions you have with your health care provider.

## 2019-01-30 ENCOUNTER — Encounter: Payer: Self-pay | Admitting: Advanced Practice Midwife

## 2019-02-01 ENCOUNTER — Encounter: Payer: Self-pay | Admitting: Women's Health

## 2019-02-05 ENCOUNTER — Encounter: Payer: Self-pay | Admitting: Women's Health

## 2019-02-05 ENCOUNTER — Ambulatory Visit (INDEPENDENT_AMBULATORY_CARE_PROVIDER_SITE_OTHER): Payer: Medicaid Other | Admitting: Women's Health

## 2019-02-05 ENCOUNTER — Other Ambulatory Visit: Payer: Self-pay

## 2019-02-05 VITALS — BP 127/80 | HR 88 | Wt 232.0 lb

## 2019-02-05 DIAGNOSIS — Z3A35 35 weeks gestation of pregnancy: Secondary | ICD-10-CM

## 2019-02-05 DIAGNOSIS — F1290 Cannabis use, unspecified, uncomplicated: Secondary | ICD-10-CM

## 2019-02-05 DIAGNOSIS — Z1389 Encounter for screening for other disorder: Secondary | ICD-10-CM

## 2019-02-05 DIAGNOSIS — Z3483 Encounter for supervision of other normal pregnancy, third trimester: Secondary | ICD-10-CM

## 2019-02-05 DIAGNOSIS — Z331 Pregnant state, incidental: Secondary | ICD-10-CM

## 2019-02-05 LAB — POCT URINALYSIS DIPSTICK OB
Glucose, UA: NEGATIVE
Ketones, UA: NEGATIVE
Leukocytes, UA: NEGATIVE
Nitrite, UA: NEGATIVE
RBC UA: NEGATIVE

## 2019-02-05 NOTE — Patient Instructions (Signed)
Dezarae D Studzinski, I greatly value your feedback.  If you receive a survey following your visit with [place] today, we appreciate you taking the time to fill it out.  Thanks, Joellyn Haff, CNM, [facility]  [facility] HAS MOVED!!! It is now [facility] ([address] [place], [place] [address]) Entrance located off of [address] Free 24/7 valet parking    Call the office [phone]) or go to [facility] if:  You begin to have strong, frequent contractions  Your water breaks.  Sometimes it is a big gush of fluid, sometimes it is just a trickle that keeps getting your panties wet or running down your legs  You have vaginal bleeding.  It is normal to have a small amount of spotting if your cervix was checked.   You don't feel your baby moving like normal.  If you don't, get you something to eat and drink and lay down and focus on feeling your baby move.  You should feel at least 10 movements in 2 hours.  If you don't, you should call the office or go to [facility].     Preterm Labor and Birth Information  The normal length of a pregnancy is 39-41 weeks. Preterm labor is when labor starts before 37 completed weeks of pregnancy. What are the risk factors for preterm labor? Preterm labor is more likely to occur in women who:  Have certain infections during pregnancy such as a bladder infection, sexually transmitted infection, or infection inside the uterus (chorioamnionitis).  Have a shorter-than-normal cervix.  Have gone into preterm labor before.  Have had surgery on their cervix.  Are younger than age 64 or older than age 40.  Are African American.  Are pregnant with twins or multiple babies (multiple gestation).  Take street drugs or smoke while pregnant.  Do not gain enough weight while pregnant.  Became pregnant shortly after having been pregnant. What are the symptoms of preterm labor? Symptoms of preterm labor include:  Cramps  similar to those that can happen during a menstrual period. The cramps may happen with diarrhea.  Pain in the abdomen or lower back.  Regular uterine contractions that may feel like tightening of the abdomen.  A feeling of increased pressure in the pelvis.  Increased watery or bloody mucus discharge from the vagina.  Water breaking (ruptured amniotic sac). Why is it important to recognize signs of preterm labor? It is important to recognize signs of preterm labor because babies who are born prematurely may not be fully developed. This can put them at an increased risk for:  Long-term (chronic) heart and lung problems.  Difficulty immediately after birth with regulating body systems, including blood sugar, body temperature, heart rate, and breathing rate.  Bleeding in the brain.  Cerebral palsy.  Learning difficulties.  Death. These risks are highest for babies who are born before 34 weeks of pregnancy. How is preterm labor treated? Treatment depends on the length of your pregnancy, your condition, and the health of your baby. It may involve:  Having a stitch (suture) placed in your cervix to prevent your cervix from opening too early (cerclage).  Taking or being given medicines, such as: ? Hormone medicines. These may be given early in pregnancy to help support the pregnancy. ? Medicine to stop contractions. ? Medicines to help mature the baby's lungs. These may be prescribed if the risk of delivery is high. ? Medicines to prevent your baby from developing cerebral  palsy. If the labor happens before 34 weeks of pregnancy, you may need to stay in the hospital. What should I do if I think I am in preterm labor? If you think that you are going into preterm labor, call your health care provider right away. How can I prevent preterm labor in future pregnancies? To increase your chance of having a full-term pregnancy:  Do not use any tobacco products, such as cigarettes, chewing  tobacco, and e-cigarettes. If you need help quitting, ask your health care provider.  Do not use street drugs or medicines that have not been prescribed to you during your pregnancy.  Talk with your health care provider before taking any herbal supplements, even if you have been taking them regularly.  Make sure you gain a healthy amount of weight during your pregnancy.  Watch for infection. If you think that you might have an infection, get it checked right away.  Make sure to tell your health care provider if you have gone into preterm labor before. This information is not intended to replace advice given to you by your health care provider. Make sure you discuss any questions you have with your health care provider. Document Released: 01/21/2004 Document Revised: 04/12/2016 Document Reviewed: 03/23/2016 Elsevier Interactive Patient Education  2019 [employer].  Coronavirus (COVID-19) Are you at risk?  Are you at risk for the Coronavirus (COVID-19)?  To be considered HIGH RISK for Coronavirus (COVID-19), you have to meet the following criteria:  . Traveled to [place], [place], [place], [place] or [place]; or in the [place] to [place], [place], [place], or [place]; and have fever, cough, and shortness of breath within the last 2 weeks of travel OR . Been in close contact with a person diagnosed with COVID-19 within the last 2 weeks and have fever, cough, and shortness of breath . IF YOU DO NOT MEET THESE CRITERIA, YOU ARE CONSIDERED LOW RISK FOR COVID-19.  What to do if you are HIGH RISK for COVID-19?  Marland Kitchen If you are having a medical emergency, call 911. . Seek medical care right away. Before you go to a doctor's office, urgent care or emergency department, call ahead and tell them about your recent travel, contact with someone diagnosed with COVID-19, and your symptoms. You should receive instructions from your physician's office regarding next steps of care.  . When you  arrive at healthcare provider, tell the healthcare staff immediately you have returned from visiting [place], [place], [place], [place] or [place]; or traveled in the [place] to [place], [place], [place], or [place]; in the last two weeks or you have been in close contact with a person diagnosed with COVID-19 in the last 2 weeks.   . Tell the health care staff about your symptoms: fever, cough and shortness of breath. . After you have been seen by a medical provider, you will be either: o Tested for (COVID-19) and discharged home on quarantine except to seek medical care if symptoms worsen, and asked to  - Stay home and avoid contact with others until you get your results (4-5 days)  - Avoid travel on public transportation if possible (such as bus, train, or airplane) or o Sent to the Emergency Department by EMS for evaluation, COVID-19 testing, and possible admission depending on your condition and test results.  What to do if you are LOW RISK for COVID-19?  Reduce your risk of any infection by using the same precautions used for avoiding  the common cold or flu:  Marland Kitchen Wash your hands often with soap and warm water for at least 20 seconds.  If soap and water are not readily available, use an alcohol-based hand sanitizer with at least 60% alcohol.  . If coughing or sneezing, cover your mouth and nose by coughing or sneezing into the elbow areas of your shirt or coat, into a tissue or into your sleeve (not your hands). . Avoid shaking hands with others and consider head nods or verbal greetings only. . Avoid touching your eyes, nose, or mouth with unwashed hands.  . Avoid close contact with people who are sick. . Avoid places or events with large numbers of people in one location, like concerts or sporting events. . Carefully consider travel plans you have or are making. . If you are planning any travel outside or inside the [place], visit the CDC's Travelers' Health webpage for the latest health  notices. . If you have some symptoms but not all symptoms, continue to monitor at home and seek medical attention if your symptoms worsen. . If you are having a medical emergency, call 911.   ADDITIONAL HEALTHCARE OPTIONS FOR PATIENTS  [facility] / e-Visit: [email or web address]         [facility]: [phone]  [facility]: [phone]                   [facility]: [phone]

## 2019-02-05 NOTE — Progress Notes (Signed)
   LOW-RISK PREGNANCY VISIT Patient name: Rose Holden MRN [id number]  Date of birth: 11/11/1991 Chief Complaint:   Routine Prenatal Visit  History of Present Illness:   Rose Holden is a 28 y.o. [id number] female at [redacted]w[redacted]d with an Estimated Date of Delivery: 03/09/19 being seen today for ongoing management of a low-risk pregnancy.  Today she reports no complaints. Contractions: Not present.  .  Movement: Present. denies leaking of fluid. Review of Systems:   Pertinent items are noted in HPI Denies abnormal vaginal discharge w/ itching/odor/irritation, headaches, visual changes, shortness of breath, chest pain, abdominal pain, severe nausea/vomiting, or problems with urination or bowel movements unless otherwise stated above. Pertinent History Reviewed:  Reviewed past medical,surgical, social, obstetrical and family history.  Reviewed problem list, medications and allergies. Physical Assessment:   Vitals:   02/05/19 1343  BP: 127/80  Pulse: 88  Weight: 232 lb (105.2 kg)  Body mass index is 42.43 kg/m.        Physical Examination:   General appearance: Well appearing, and in no distress  Mental status: Alert, oriented to person, place, and time  Skin: Warm & dry  Cardiovascular: Normal heart rate noted  Respiratory: Normal respiratory effort, no distress  Abdomen: Soft, gravid, nontender  Pelvic: Cervical exam deferred         Extremities: Edema: None  Fetal Status: Fetal Heart Rate (bpm): 155 Fundal Height: 36 cm Movement: Present    Results for orders placed or performed in visit on 02/05/19 (from the past 24 hour(s))  POC Urinalysis Dipstick OB   Collection Time: 02/05/19  1:50 PM  Result Value Ref Range   Color, UA     Clarity, UA     Glucose, UA Negative Negative   Bilirubin, UA     Ketones, UA neg    Spec Grav, UA     Blood, UA neg    pH, UA     POC,PROTEIN,UA Trace Negative, Trace, Small (1+), Moderate (2+), Large (3+), 4+   Urobilinogen, UA     Nitrite, UA neg     Leukocytes, UA Negative Negative   Appearance     Odor      Assessment & Plan:  1) Low-risk pregnancy [id number] at [redacted]w[redacted]d with an Estimated Date of Delivery: 03/09/19     Meds: No orders of the defined types were placed in this encounter.  Labs/procedures today: none  Plan:  Continue routine obstetrical care   Reviewed: Preterm labor symptoms and general obstetric precautions including but not limited to vaginal bleeding, contractions, leaking of fluid and fetal movement were reviewed in detail with the patient.  All questions were answered  Follow-up: Return in about 8 days (around 02/13/2019) for LROB.gbs, gc/ct, bring home bp cuff  Orders Placed This Encounter  Procedures  . Pain Management Screening Profile (10S)  . POC Urinalysis Dipstick OB   Cheral Marker CNM, [facility] 02/05/2019 2:04 PM

## 2019-02-06 LAB — PMP SCREEN PROFILE (10S), URINE
Amphetamine Scrn, Ur: NEGATIVE ng/mL
BARBITURATE SCREEN URINE: NEGATIVE ng/mL
BENZODIAZEPINE SCREEN, URINE: NEGATIVE ng/mL
CANNABINOIDS UR QL SCN: NEGATIVE ng/mL
Cocaine (Metab) Scrn, Ur: NEGATIVE ng/mL
Creatinine(Crt), U: 247.4 mg/dL (ref 20.0–300.0)
Methadone Screen, Urine: NEGATIVE ng/mL
OXYCODONE+OXYMORPHONE UR QL SCN: NEGATIVE ng/mL
Opiate Scrn, Ur: NEGATIVE ng/mL
Ph of Urine: 7.1 (ref 4.5–8.9)
Phencyclidine Qn, Ur: NEGATIVE ng/mL
Propoxyphene Scrn, Ur: NEGATIVE ng/mL

## 2019-02-12 ENCOUNTER — Telehealth: Payer: Self-pay | Admitting: *Deleted

## 2019-02-12 NOTE — Telephone Encounter (Signed)
Patient informed that we are not allowing visitors or children to come to appointments at this time. Patient denies any contact with anyone suspected or confirmed of having COVID-19. Pt denies fever, cough, sob, muscle pain, diarrhea, rash, vomiting, abdominal pain, red eye, weakness, bruising or bleeding, joint pain or severe headache.  

## 2019-02-13 ENCOUNTER — Ambulatory Visit (INDEPENDENT_AMBULATORY_CARE_PROVIDER_SITE_OTHER): Payer: Medicaid Other | Admitting: Obstetrics and Gynecology

## 2019-02-13 ENCOUNTER — Other Ambulatory Visit: Payer: Self-pay

## 2019-02-13 ENCOUNTER — Encounter: Payer: Self-pay | Admitting: Obstetrics and Gynecology

## 2019-02-13 VITALS — BP 134/75 | HR 90 | Temp 97.8°F | Wt 235.0 lb

## 2019-02-13 DIAGNOSIS — Z3483 Encounter for supervision of other normal pregnancy, third trimester: Secondary | ICD-10-CM

## 2019-02-13 DIAGNOSIS — Z331 Pregnant state, incidental: Secondary | ICD-10-CM

## 2019-02-13 DIAGNOSIS — R825 Elevated urine levels of drugs, medicaments and biological substances: Secondary | ICD-10-CM | POA: Diagnosis not present

## 2019-02-13 DIAGNOSIS — Z1389 Encounter for screening for other disorder: Secondary | ICD-10-CM

## 2019-02-13 DIAGNOSIS — Z3A36 36 weeks gestation of pregnancy: Secondary | ICD-10-CM

## 2019-02-13 LAB — POCT URINALYSIS DIPSTICK OB
Blood, UA: NEGATIVE
Glucose, UA: NEGATIVE
Ketones, UA: NEGATIVE
Leukocytes, UA: NEGATIVE
Nitrite, UA: NEGATIVE
POC,PROTEIN,UA: NEGATIVE

## 2019-02-13 NOTE — Progress Notes (Signed)
Patient ID: Rose Holden, female   DOB: Oct 07, 1991, 28 y.o.   MRN: [id number]    LOW-RISK PREGNANCY VISIT Patient name: Rose Holden MRN [id number]  Date of birth: 1990/12/04 Chief Complaint:   Routine Prenatal Visit (GBS, GC/CHL)  History of Present Illness:   [name] is a 28 y.o. [id number] female at [redacted]w[redacted]d with an Estimated Date of Delivery: 03/09/19 being seen today for ongoing management of a low-risk pregnancy. Had +gonorrhea on 01/02/2019 lab. BTL while in the hospital. Has BP cuff @ home Today she reports no complaints. Contractions: Not present. Vag. Bleeding: None.  Movement: Present. denies leaking of fluid. Review of Systems:   Pertinent items are noted in HPI Denies abnormal vaginal discharge w/ itching/odor/irritation, headaches, visual changes, shortness of breath, chest pain, abdominal pain, severe nausea/vomiting, or problems with urination or bowel movements unless otherwise stated above. Pertinent History Reviewed:  Reviewed past medical,surgical, social, obstetrical and family history.  Reviewed problem list, medications and allergies. Physical Assessment:   Vitals:   02/13/19 1347  BP: 134/75  Pulse: 90  Temp: 97.8 F (36.6 C)  Weight: 235 lb (106.6 kg)  Body mass index is 42.98 kg/m.        Physical Examination:   General appearance: Well appearing, and in no distress  Mental status: Alert, oriented to person, place, and time  Skin: Warm & dry  Cardiovascular: Normal heart rate noted  Respiratory: Normal respiratory effort, no distress  Abdomen: Soft, gravid, nontender  Pelvic: Cervical exam performed vertex, long, closed high        Extremities: Edema: Trace  Fetal Status: Fetal Heart Rate (bpm): 161   Movement: Present    Results for orders placed or performed in visit on 02/13/19 (from the past 24 hour(s))  POC Urinalysis Dipstick OB   Collection Time: 02/13/19  1:48 PM  Result Value Ref Range   Color, UA     Clarity, UA     Glucose, UA Negative  Negative   Bilirubin, UA     Ketones, UA neg    Spec Grav, UA     Blood, UA neg    pH, UA     POC,PROTEIN,UA Negative Negative, Trace, Small (1+), Moderate (2+), Large (3+), 4+   Urobilinogen, UA     Nitrite, UA neg    Leukocytes, UA Negative Negative   Appearance     Odor      Assessment & Plan:  1) Low-risk pregnancy [id number] at [redacted]w[redacted]d with an Estimated Date of Delivery: 03/09/19   2) hx GC for POC today 3) Hx cocaine use early pregnancy recent UDS negative. Meds: No orders of the defined types were placed in this encounter.  Labs/procedures today: GBS/GCCHL  Plan:  Continue routine obstetrical care F/u in 1 week televisit if will BP pressure at home  Reviewed: Term labor symptoms and general obstetric precautions including but not limited to vaginal bleeding, contractions, leaking of fluid and fetal movement were reviewed in detail with the patient.  All questions were answered  Follow-up: Return in about 2 weeks (around 02/27/2019).  Orders Placed This Encounter  Procedures  . GC/Chlamydia Probe Amp  . Strep Gp B NAA  . POC Urinalysis Dipstick OB   By signing my name below, I, Arnette Norris, attest that this documentation has been prepared under the direction and in the presence of Tilda Burrow, MD. Electronically Signed: Arnette Norris Medical Scribe. 02/13/19. 2:02 PM.  I personally performed the services described in this  documentation, which was SCRIBED in my presence. The recorded information has been reviewed and considered accurate. It has been edited as necessary during review. Jonnie Kind, MD

## 2019-02-13 NOTE — Addendum Note (Signed)
Addended by: Colen Darling on: 02/13/2019 02:16 PM   Modules accepted: Orders

## 2019-02-14 LAB — PMP SCREEN PROFILE (10S), URINE
Amphetamine Scrn, Ur: NEGATIVE ng/mL
BARBITURATE SCREEN URINE: NEGATIVE ng/mL
BENZODIAZEPINE SCREEN, URINE: NEGATIVE ng/mL
CANNABINOIDS UR QL SCN: NEGATIVE ng/mL
Cocaine (Metab) Scrn, Ur: NEGATIVE ng/mL
Creatinine(Crt), U: 205.6 mg/dL (ref 20.0–300.0)
Methadone Screen, Urine: NEGATIVE ng/mL
OXYCODONE+OXYMORPHONE UR QL SCN: NEGATIVE ng/mL
Opiate Scrn, Ur: NEGATIVE ng/mL
Ph of Urine: 6.5 (ref 4.5–8.9)
Phencyclidine Qn, Ur: NEGATIVE ng/mL
Propoxyphene Scrn, Ur: NEGATIVE ng/mL

## 2019-02-15 LAB — GC/CHLAMYDIA PROBE AMP
Chlamydia trachomatis, NAA: NEGATIVE
Neisseria Gonorrhoeae by PCR: NEGATIVE

## 2019-02-15 LAB — STREP GP B NAA: Strep Gp B NAA: POSITIVE — AB

## 2019-02-20 ENCOUNTER — Encounter: Payer: Self-pay | Admitting: Obstetrics and Gynecology

## 2019-02-20 ENCOUNTER — Other Ambulatory Visit: Payer: Self-pay

## 2019-02-20 ENCOUNTER — Ambulatory Visit (INDEPENDENT_AMBULATORY_CARE_PROVIDER_SITE_OTHER): Payer: Medicaid Other | Admitting: Obstetrics and Gynecology

## 2019-02-20 DIAGNOSIS — Z3A37 37 weeks gestation of pregnancy: Secondary | ICD-10-CM

## 2019-02-20 DIAGNOSIS — A549 Gonococcal infection, unspecified: Secondary | ICD-10-CM

## 2019-02-20 DIAGNOSIS — Z3483 Encounter for supervision of other normal pregnancy, third trimester: Secondary | ICD-10-CM

## 2019-02-20 DIAGNOSIS — Z202 Contact with and (suspected) exposure to infections with a predominantly sexual mode of transmission: Secondary | ICD-10-CM

## 2019-02-20 DIAGNOSIS — O98913 Unspecified maternal infectious and parasitic disease complicating pregnancy, third trimester: Secondary | ICD-10-CM

## 2019-02-20 NOTE — Progress Notes (Addendum)
   LOW-RISK PREGNANCY VISIT WebEX visit Patient name: Rose Holden MRN [id number]  Date of birth: [birth year] Chief Complaint:   No chief complaint on file.  History of Present Illness:   Rose Holden is a 28 y.o. [id number] female at [redacted]w[redacted]d with an Estimated Date of Delivery: 03/09/19 being seen today for ongoing management of a low-risk pregnancy.  Today she reports no contractions, no cramping and no leaking.  .  .   . denies leaking of fluid.  Patient agreed to a WebEx visit, she is aware that direct face-to-face can be achieved is discouraged Review of Systems:   Pertinent items are noted in HPI Denies abnormal vaginal discharge w/ itching/odor/irritation, headaches, visual changes, shortness of breath, chest pain, abdominal pain, severe nausea/vomiting, or problems with urination or bowel movements unless otherwise stated above. Pertinent History Reviewed:  Reviewed past medical,surgical, social, obstetrical and family history.  Reviewed problem list, medications and allergies. Physical Assessment:  There were no vitals filed for this visit.There is no height or weight on file to calculate BMI.        Physical Examination:   General appearance: Well appearing, and in no distress  Mental status: Alert, oriented to person, place, and time   Fetal Status:        good fetal movement noted by pt  No results found for this or any previous visit (from the past 24 hour(s)).  Assessment & Plan:  1) Low-risk pregnancy [id number] at [redacted]w[redacted]d with an Estimated Date of Delivery: 03/09/19   2) desires BTL 3) GBS POS,  4 hx GC, now negative POC   Meds: No orders of the defined types were placed in this encounter.  Labs/procedures today: none  Plan:  Continue routine obstetrical care , televisit or webex 1 wk, then appt 2 wk  Reviewed: Kick counts  Follow-up: No follow-ups on file.  No orders of the defined types were placed in this encounter.  Tilda Burrow CNM, [facility] 02/20/2019 1:49 PM

## 2019-02-28 ENCOUNTER — Encounter: Payer: Medicaid Other | Admitting: Obstetrics and Gynecology

## 2019-02-28 ENCOUNTER — Other Ambulatory Visit: Payer: Self-pay

## 2019-03-05 ENCOUNTER — Telehealth: Payer: Self-pay | Admitting: *Deleted

## 2019-03-05 NOTE — Telephone Encounter (Signed)

## 2019-03-06 ENCOUNTER — Other Ambulatory Visit: Payer: Self-pay

## 2019-03-06 ENCOUNTER — Ambulatory Visit (INDEPENDENT_AMBULATORY_CARE_PROVIDER_SITE_OTHER): Payer: Medicaid Other | Admitting: Obstetrics and Gynecology

## 2019-03-06 ENCOUNTER — Encounter: Payer: Self-pay | Admitting: Obstetrics and Gynecology

## 2019-03-06 VITALS — BP 132/86 | HR 92 | Temp 98.2°F | Wt 233.0 lb

## 2019-03-06 DIAGNOSIS — Z331 Pregnant state, incidental: Secondary | ICD-10-CM

## 2019-03-06 DIAGNOSIS — Z3483 Encounter for supervision of other normal pregnancy, third trimester: Secondary | ICD-10-CM

## 2019-03-06 DIAGNOSIS — Z1389 Encounter for screening for other disorder: Secondary | ICD-10-CM

## 2019-03-06 DIAGNOSIS — Z23 Encounter for immunization: Secondary | ICD-10-CM

## 2019-03-06 DIAGNOSIS — Z3A39 39 weeks gestation of pregnancy: Secondary | ICD-10-CM

## 2019-03-06 LAB — POCT URINALYSIS DIPSTICK OB
Blood, UA: NEGATIVE
Glucose, UA: NEGATIVE
Ketones, UA: NEGATIVE
Nitrite, UA: NEGATIVE
POC,PROTEIN,UA: NEGATIVE

## 2019-03-06 NOTE — Progress Notes (Signed)
Patient ID: Rose Holden, female   DOB: 1991/08/30, 30 y.o.   MRN: [id number]    LOW-RISK PREGNANCY VISIT Patient name: Rose Holden MRN [id number]  Date of birth: Jan 02, 1991 Chief Complaint:   Routine Prenatal Visit  History of Present Illness:   Rose Holden is a 28 y.o. [id number] female at [redacted]w[redacted]d with an Estimated Date of Delivery: 03/09/19 being seen today for ongoing management of a low-risk pregnancy.  Early in pregnancy she had a positive UDS for cocaine but 2 tests have been negative in third trimester Today she reports no complaints. Contractions: Irregular. Vag. Bleeding: None.  Movement: Present. denies leaking of fluid. Review of Systems:   Pertinent items are noted in HPI Denies abnormal vaginal discharge w/ itching/odor/irritation, headaches, visual changes, shortness of breath, chest pain, abdominal pain, severe nausea/vomiting, or problems with urination or bowel movements unless otherwise stated above. Pertinent History Reviewed:  Reviewed past medical,surgical, social, obstetrical and family history.  Reviewed problem list, medications and allergies. Physical Assessment:   Vitals:   03/06/19 1003  BP: 132/86  Pulse: 92  Temp: 98.2 F (36.8 C)  Weight: 233 lb (105.7 kg)  Body mass index is 42.62 kg/m.        Physical Examination:   General appearance: Well appearing, and in no distress  Mental status: Alert, oriented to person, place, and time  Skin: Warm & dry  Cardiovascular: Normal heart rate noted  Respiratory: Normal respiratory effort, no distress  Abdomen: Soft, gravid, nontender 39 cm, fetal heart rate 142  Pelvic: Cervical exam performed long, closed, head high. Bedside u/s used: fetus vertex, ballotable        Extremities: Edema: Trace  Fetal Status: Fetal Heart Rate (bpm): 142 Fundal Height: 39 cm Movement: Present    Results for orders placed or performed in visit on 03/06/19 (from the past 24 hour(s))  POC Urinalysis Dipstick OB   Collection Time:  03/06/19 10:04 AM  Result Value Ref Range   Color, UA     Clarity, UA     Glucose, UA Negative Negative   Bilirubin, UA     Ketones, UA neg    Spec Grav, UA     Blood, UA neg    pH, UA     POC,PROTEIN,UA Negative Negative, Trace, Small (1+), Moderate (2+), Large (3+), 4+   Urobilinogen, UA     Nitrite, UA neg    Leukocytes, UA Small (1+) (A) Negative   Appearance     Odor      Assessment & Plan:  1) Low-risk pregnancy [id number] at [redacted]w[redacted]d with an Estimated Date of Delivery: 03/09/19   2) GBS, POS  3) Hx of GC, resolved POC   Meds: No orders of the defined types were placed in this encounter.  Labs/procedures today: None  Plan:  1. 1 week f/u NST, IOL, cervical exam   Reviewed: Term labor symptoms and general obstetric precautions including but not limited to vaginal bleeding, contractions, leaking of fluid and fetal movement were reviewed in detail with the patient.  All questions were answered  Follow-up: No follow-ups on file.  Orders Placed This Encounter  Procedures  . Tdap vaccine greater than or equal to 7yo IM  . POC Urinalysis Dipstick OB   By signing my name below, I, Arnette Norris, attest that this documentation has been prepared under the direction and in the presence of Tilda Burrow, MD. Electronically Signed: Arnette Norris Medical Scribe. 03/06/19. 10:31 AM.  I personally performed  the services described in this documentation, which was SCRIBED in my presence. The recorded information has been reviewed and considered accurate. It has been edited as necessary during review. Tilda Burrow, MD

## 2019-03-12 ENCOUNTER — Telehealth: Payer: Self-pay | Admitting: *Deleted

## 2019-03-12 NOTE — Telephone Encounter (Signed)

## 2019-03-13 ENCOUNTER — Ambulatory Visit (INDEPENDENT_AMBULATORY_CARE_PROVIDER_SITE_OTHER): Payer: Medicaid Other | Admitting: Women's Health

## 2019-03-13 ENCOUNTER — Other Ambulatory Visit: Payer: Self-pay | Admitting: Advanced Practice Midwife

## 2019-03-13 ENCOUNTER — Other Ambulatory Visit: Payer: Self-pay

## 2019-03-13 ENCOUNTER — Encounter: Payer: Self-pay | Admitting: Women's Health

## 2019-03-13 VITALS — BP 133/89 | HR 93 | Temp 98.5°F | Wt 232.0 lb

## 2019-03-13 DIAGNOSIS — O480 Post-term pregnancy: Secondary | ICD-10-CM

## 2019-03-13 DIAGNOSIS — O0932 Supervision of pregnancy with insufficient antenatal care, second trimester: Secondary | ICD-10-CM

## 2019-03-13 DIAGNOSIS — Z3A40 40 weeks gestation of pregnancy: Secondary | ICD-10-CM

## 2019-03-13 DIAGNOSIS — Z1389 Encounter for screening for other disorder: Secondary | ICD-10-CM

## 2019-03-13 DIAGNOSIS — Z3483 Encounter for supervision of other normal pregnancy, third trimester: Secondary | ICD-10-CM

## 2019-03-13 DIAGNOSIS — Z331 Pregnant state, incidental: Secondary | ICD-10-CM

## 2019-03-13 DIAGNOSIS — Z202 Contact with and (suspected) exposure to infections with a predominantly sexual mode of transmission: Secondary | ICD-10-CM

## 2019-03-13 LAB — POCT URINALYSIS DIPSTICK OB
Blood, UA: NEGATIVE
Glucose, UA: NEGATIVE
Ketones, UA: NEGATIVE
Leukocytes, UA: NEGATIVE
Nitrite, UA: NEGATIVE
POC,PROTEIN,UA: NEGATIVE

## 2019-03-13 NOTE — Patient Instructions (Signed)
Your induction is scheduled for Saturday 5/2 @ 7:00am. Go to the main desk at [facility] hospital and let them know you are there to be induced. They will send someone from Labor & Delivery to come get you.    Rose Holden, I greatly value your feedback.  If you receive a survey following your visit with [place] today, we appreciate you taking the time to fill it out.  Thanks, Joellyn Haff, CNM, [facility]  [facility] HAS MOVED!!! It is now [facility] ([address] [place], [place] [address]) Entrance located off of [address] Free 24/7 valet parking    Call the office [phone]) or go to [facility] if:  You begin to have strong, frequent contractions  Your water breaks.  Sometimes it is a big gush of fluid, sometimes it is just a trickle that keeps getting your panties wet or running down your legs  You have vaginal bleeding.  It is normal to have a small amount of spotting if your cervix was checked.   You don't feel your baby moving like normal.  If you don't, get you something to eat and drink and lay down and focus on feeling your baby move.  You should feel at least 10 movements in 2 hours.  If you don't, you should call the office or go to [facility].   Call the office [phone]) or go to [facility] hospital for these signs of pre-eclampsia:  Severe headache that does not go away with Tylenol  Visual changes- seeing spots, double, blurred vision  Pain under your right breast or upper abdomen that does not go away with Tums or heartburn medicine  Nausea and/or vomiting  Severe swelling in your hands, feet, and face     Braxton Hicks Contractions Contractions of the uterus can occur throughout pregnancy, but they are not always a sign that you are in labor. You may have practice contractions called Braxton Hicks contractions. These false labor contractions are sometimes confused with true labor. What are Deberah Pelton contractions?  Braxton Hicks contractions are tightening movements that occur in the muscles of the uterus before labor. Unlike true labor contractions, these contractions do not result in opening (dilation) and thinning of the cervix. Toward the end of pregnancy (32-34 weeks), Braxton Hicks contractions can happen more often and may become stronger. These contractions are sometimes difficult to tell apart from true labor because they can be very uncomfortable. You should not feel embarrassed if you go to the hospital with false labor. Sometimes, the only way to tell if you are in true labor is for your health care provider to look for changes in the cervix. The health care provider will do a physical exam and may monitor your contractions. If you are not in true labor, the exam should show that your cervix is not dilating and your water has not broken. If there are no other health problems associated with your pregnancy, it is completely safe for you to be sent home with false labor. You may continue to have Braxton Hicks contractions until you go into true labor. How to tell the difference between true labor and false labor True labor  Contractions last 30-70 seconds.  Contractions become very regular.  Discomfort is usually felt in the top of the uterus, and it spreads to the lower abdomen and low back.  Contractions do not go away with walking.  Contractions usually become more intense and increase in  frequency.  The cervix dilates and gets thinner. False labor  Contractions are usually shorter and not as strong as true labor contractions.  Contractions are usually irregular.  Contractions are often felt in the front of the lower abdomen and in the groin.  Contractions may go away when you walk around or change positions while lying down.  Contractions get weaker and are shorter-lasting as time goes on.  The cervix usually does not dilate or become thin. Follow these instructions at home:    Take over-the-counter and prescription medicines only as told by your health care provider.  Keep up with your usual exercises and follow other instructions from your health care provider.  Eat and drink lightly if you think you are going into labor.  If Braxton Hicks contractions are making you uncomfortable: ? Change your position from lying down or resting to walking, or change from walking to resting. ? Sit and rest in a tub of warm water. ? Drink enough fluid to keep your urine pale yellow. Dehydration may cause these contractions. ? Do slow and deep breathing several times an hour.  Keep all follow-up prenatal visits as told by your health care provider. This is important. Contact a health care provider if:  You have a fever.  You have continuous pain in your abdomen. Get help right away if:  Your contractions become stronger, more regular, and closer together.  You have fluid leaking or gushing from your vagina.  You pass blood-tinged mucus (bloody show).  You have bleeding from your vagina.  You have low back pain that you never had before.  You feel your baby's head pushing down and causing pelvic pressure.  Your baby is not moving inside you as much as it used to. Summary  Contractions that occur before labor are called Braxton Hicks contractions, false labor, or practice contractions.  Braxton Hicks contractions are usually shorter, weaker, farther apart, and less regular than true labor contractions. True labor contractions usually become progressively stronger and regular, and they become more frequent.  Manage discomfort from [facility] contractions by changing position, resting in a warm bath, drinking plenty of water, or practicing deep breathing. This information is not intended to replace advice given to you by your health care provider. Make sure you discuss any questions you have with your health care provider. Document Released: 03/16/2017 Document  Revised: 08/15/2017 Document Reviewed: 03/16/2017 Elsevier Interactive Patient Education  2019 [employer].

## 2019-03-13 NOTE — Progress Notes (Signed)
   LOW-RISK PREGNANCY VISIT Patient name: Rose Holden MRN [id number]  Date of birth: 02-25-1991 Chief Complaint:   Routine Prenatal Visit (NST)  History of Present Illness:   Rose Holden is a 28 y.o. [id number] female at [redacted]w[redacted]d with an Estimated Date of Delivery: 03/09/19 being seen today for ongoing management of a low-risk pregnancy.  Today she reports no complaints. Contractions: Not present. Vag. Bleeding: None.  Movement: Present. denies leaking of fluid. Review of Systems:   Pertinent items are noted in HPI Denies abnormal vaginal discharge w/ itching/odor/irritation, headaches, visual changes, shortness of breath, chest pain, abdominal pain, severe nausea/vomiting, or problems with urination or bowel movements unless otherwise stated above. Pertinent History Reviewed:  Reviewed past medical,surgical, social, obstetrical and family history.  Reviewed problem list, medications and allergies. Physical Assessment:   Vitals:   03/13/19 1014  BP: 133/89  Pulse: 93  Temp: 98.5 F (36.9 C)  Weight: 232 lb (105.2 kg)  Body mass index is 42.43 kg/m.        Physical Examination:   General appearance: Well appearing, and in no distress  Mental status: Alert, oriented to person, place, and time  Skin: Warm & dry  Cardiovascular: Normal heart rate noted  Respiratory: Normal respiratory effort, no distress  Abdomen: Soft, gravid, nontender  Pelvic: Cervical exam performed  Dilation: 1 Effacement (%): Thick Station: Ballotable, -3  vtx no where near pelvis! Vtx by informal u/s Extremities: Edema: Trace  Fetal Status: Fetal Heart Rate (bpm): 130 Fundal Height: 40 cm Movement: Present Presentation: Vertex   NST: FHR baseline 130 bpm, Variability: moderate, Accelerations:present, Decelerations:  Absent= Cat 1/Reactive Toco: none    Results for orders placed or performed in visit on 03/13/19 (from the past 24 hour(s))  POC Urinalysis Dipstick OB   Collection Time: 03/13/19 11:02 AM   Result Value Ref Range   Color, UA     Clarity, UA     Glucose, UA Negative Negative   Bilirubin, UA     Ketones, UA neg    Spec Grav, UA     Blood, UA neg    pH, UA     POC,PROTEIN,UA Negative Negative, Trace, Small (1+), Moderate (2+), Large (3+), 4+   Urobilinogen, UA     Nitrite, UA neg    Leukocytes, UA Negative Negative   Appearance     Odor      Assessment & Plan:  1) Low-risk pregnancy [id number] at [redacted]w[redacted]d with an Estimated Date of Delivery: 03/09/19   2) Postdates, reactive NST, IOL scheduled for 5/2 @ 0700.  IOL form faxed via Epic and orders placed    Meds: No orders of the defined types were placed in this encounter.  Labs/procedures today: nst, sve  Plan:  Continue routine obstetrical care   Reviewed: Term labor symptoms and general obstetric precautions including but not limited to vaginal bleeding, contractions, leaking of fluid and fetal movement were reviewed in detail with the patient.  All questions were answered  Follow-up: Return for will call for ppv.  Orders Placed This Encounter  Procedures  . POC Urinalysis Dipstick OB   Cheral Marker CNM, [facility] 03/13/2019 11:09 AM

## 2019-03-13 NOTE — Treatment Plan (Signed)
Induction Assessment Scheduling Form Fax to [facility]:  [phone]  Yosselyn D Colbaugh                                                                                   DOB:  Sep 22, 1991                                                            MRN:  [id number]                                                                     Phone #:        [phone]              Provider:  [facility]  GP:  [id number]                                                            Estimated Date of Delivery: 03/09/19  Dating Criteria: 13wk u/s    Medical Indications for induction:  postdates Admission Date/Time:  5/2 @ 0700 Gestational age on admission:  45.0   Filed Weights   03/13/19 1014  Weight: 232 lb (105.2 kg)   HIV:  Non Reactive (02/12 0834) GBS: Positive (04/01 1600)  1/th/out of pelvis, vtx by u/s   Method of induction(proposed):  cytotec/FB   Scheduling Provider Signature:  Cheral Marker, CNM                                            Today's Date:  03/13/2019

## 2019-03-14 ENCOUNTER — Encounter (HOSPITAL_COMMUNITY): Payer: Self-pay | Admitting: *Deleted

## 2019-03-14 ENCOUNTER — Telehealth (HOSPITAL_COMMUNITY): Payer: Self-pay | Admitting: *Deleted

## 2019-03-14 NOTE — Telephone Encounter (Signed)
Preadmission screen  

## 2019-03-16 ENCOUNTER — Other Ambulatory Visit: Payer: Self-pay

## 2019-03-16 ENCOUNTER — Inpatient Hospital Stay (HOSPITAL_COMMUNITY)
Admission: AD | Admit: 2019-03-16 | Discharge: 2019-03-18 | DRG: 806 | Disposition: A | Discharge status: Home / Self Care | Payer: Medicaid Other | Attending: Obstetrics and Gynecology | Admitting: Obstetrics and Gynecology

## 2019-03-16 ENCOUNTER — Inpatient Hospital Stay (HOSPITAL_COMMUNITY): Payer: Medicaid Other | Admitting: Anesthesiology

## 2019-03-16 ENCOUNTER — Encounter (HOSPITAL_COMMUNITY): Payer: Self-pay | Admitting: *Deleted

## 2019-03-16 ENCOUNTER — Inpatient Hospital Stay (HOSPITAL_COMMUNITY): Payer: Medicaid Other

## 2019-03-16 DIAGNOSIS — O134 Gestational [pregnancy-induced] hypertension without significant proteinuria, complicating childbirth: Secondary | ICD-10-CM | POA: Diagnosis present

## 2019-03-16 DIAGNOSIS — O99334 Smoking (tobacco) complicating childbirth: Secondary | ICD-10-CM | POA: Diagnosis present

## 2019-03-16 DIAGNOSIS — F17200 Nicotine dependence, unspecified, uncomplicated: Secondary | ICD-10-CM | POA: Diagnosis present

## 2019-03-16 DIAGNOSIS — Z3A41 41 weeks gestation of pregnancy: Secondary | ICD-10-CM | POA: Diagnosis not present

## 2019-03-16 DIAGNOSIS — O99324 Drug use complicating childbirth: Secondary | ICD-10-CM | POA: Diagnosis present

## 2019-03-16 DIAGNOSIS — F1410 Cocaine abuse, uncomplicated: Secondary | ICD-10-CM | POA: Diagnosis present

## 2019-03-16 DIAGNOSIS — O43899 Other placental disorders, unspecified trimester: Secondary | ICD-10-CM | POA: Diagnosis not present

## 2019-03-16 DIAGNOSIS — A549 Gonococcal infection, unspecified: Secondary | ICD-10-CM | POA: Diagnosis present

## 2019-03-16 DIAGNOSIS — F17210 Nicotine dependence, cigarettes, uncomplicated: Secondary | ICD-10-CM | POA: Diagnosis present

## 2019-03-16 DIAGNOSIS — O0932 Supervision of pregnancy with insufficient antenatal care, second trimester: Secondary | ICD-10-CM

## 2019-03-16 DIAGNOSIS — O99824 Streptococcus B carrier state complicating childbirth: Secondary | ICD-10-CM | POA: Diagnosis present

## 2019-03-16 DIAGNOSIS — O99320 Drug use complicating pregnancy, unspecified trimester: Secondary | ICD-10-CM

## 2019-03-16 DIAGNOSIS — F1210 Cannabis abuse, uncomplicated: Secondary | ICD-10-CM | POA: Diagnosis present

## 2019-03-16 DIAGNOSIS — O480 Post-term pregnancy: Secondary | ICD-10-CM | POA: Diagnosis not present

## 2019-03-16 DIAGNOSIS — Z3A00 Weeks of gestation of pregnancy not specified: Secondary | ICD-10-CM | POA: Diagnosis not present

## 2019-03-16 DIAGNOSIS — F1490 Cocaine use, unspecified, uncomplicated: Secondary | ICD-10-CM | POA: Diagnosis present

## 2019-03-16 DIAGNOSIS — F1290 Cannabis use, unspecified, uncomplicated: Secondary | ICD-10-CM | POA: Diagnosis present

## 2019-03-16 LAB — CBC
HCT: 31.2 % — ABNORMAL LOW (ref 36.0–46.0)
Hemoglobin: 10.3 g/dL — ABNORMAL LOW (ref 12.0–15.0)
MCH: 28.3 pg (ref 26.0–34.0)
MCHC: 33 g/dL (ref 30.0–36.0)
MCV: 85.7 fL (ref 80.0–100.0)
Platelets: 296 10*3/uL (ref 150–400)
RBC: 3.64 MIL/uL — ABNORMAL LOW (ref 3.87–5.11)
RDW: 13.6 % (ref 11.5–15.5)
WBC: 6.3 10*3/uL (ref 4.0–10.5)
nRBC: 0 % (ref 0.0–0.2)

## 2019-03-16 LAB — COMPREHENSIVE METABOLIC PANEL
ALT: 13 U/L (ref 0–44)
AST: 18 U/L (ref 15–41)
Albumin: 2.8 g/dL — ABNORMAL LOW (ref 3.5–5.0)
Alkaline Phosphatase: 98 U/L (ref 38–126)
Anion gap: 8 (ref 5–15)
BUN: <5 mg/dL — ABNORMAL LOW (ref 6–20)
CO2: 22 mmol/L (ref 22–32)
Calcium: 9.8 mg/dL (ref 8.9–10.3)
Chloride: 108 mmol/L (ref 98–111)
Creatinine, Ser: 0.61 mg/dL (ref 0.44–1.00)
GFR calc Af Amer: >60 mL/min (ref >60)
GFR calc non Af Amer: >60 mL/min (ref >60)
Glucose, Bld: 86 mg/dL (ref 70–99)
Potassium: 3.9 mmol/L (ref 3.5–5.1)
Sodium: 138 mmol/L (ref 135–145)
Total Bilirubin: 0.4 mg/dL (ref 0.3–1.2)
Total Protein: 5.2 g/dL — ABNORMAL LOW (ref 6.5–8.1)

## 2019-03-16 LAB — TYPE AND SCREEN
ABO/RH(D): A POS
Antibody Screen: NEGATIVE

## 2019-03-16 LAB — ABO/RH: ABO/RH(D): A POS

## 2019-03-16 LAB — RPR: RPR Ser Ql: NONREACTIVE

## 2019-03-16 MED ORDER — OXYTOCIN 40 UNITS IN NORMAL SALINE INFUSION - SIMPLE MED: 1.0000 m[IU]/min | INTRAVENOUS | Status: DC

## 2019-03-16 MED ORDER — LACTATED RINGERS IV SOLN: 500.0000 mL | INTRAVENOUS | Status: DC | PRN

## 2019-03-16 MED ORDER — PHENYLEPHRINE 40 MCG/ML (10ML) SYRINGE FOR IV PUSH (FOR BLOOD PRESSURE SUPPORT)
80.0000 ug | PREFILLED_SYRINGE | INTRAVENOUS | Status: DC | PRN
  Filled 2019-03-16: qty 10

## 2019-03-16 MED ORDER — EPHEDRINE 5 MG/ML INJ: 10.0000 mg | INTRAVENOUS | Status: DC | PRN

## 2019-03-16 MED ORDER — PHENYLEPHRINE 40 MCG/ML (10ML) SYRINGE FOR IV PUSH (FOR BLOOD PRESSURE SUPPORT)
80.0000 ug | PREFILLED_SYRINGE | INTRAVENOUS | Status: DC | PRN
  Administered 2019-03-16: 80 ug via INTRAVENOUS

## 2019-03-16 MED ORDER — HYDROXYZINE HCL 50 MG PO TABS: 50.0000 mg | ORAL_TABLET | Freq: Four times a day (QID) | ORAL | Status: DC | PRN

## 2019-03-16 MED ORDER — OXYTOCIN 40 UNITS IN NORMAL SALINE INFUSION - SIMPLE MED
2.5000 [IU]/h | INTRAVENOUS | Status: DC
  Administered 2019-03-17: 2.5 [IU]/h via INTRAVENOUS
  Filled 2019-03-16: qty 1000

## 2019-03-16 MED ORDER — DIPHENHYDRAMINE HCL 50 MG/ML IJ SOLN: 12.5000 mg | INTRAMUSCULAR | Status: DC | PRN

## 2019-03-16 MED ORDER — TERBUTALINE SULFATE 1 MG/ML IJ SOLN
0.2500 mg | Freq: Once | INTRAMUSCULAR | Status: AC | PRN
  Administered 2019-03-16: 0.25 mg via SUBCUTANEOUS
  Filled 2019-03-16: qty 1

## 2019-03-16 MED ORDER — ONDANSETRON HCL 4 MG/2ML IJ SOLN: 4.0000 mg | Freq: Four times a day (QID) | INTRAMUSCULAR | Status: DC | PRN

## 2019-03-16 MED ORDER — OXYCODONE-ACETAMINOPHEN 5-325 MG PO TABS: 1.0000 | ORAL_TABLET | ORAL | Status: DC | PRN

## 2019-03-16 MED ORDER — ACETAMINOPHEN 325 MG PO TABS: 650.0000 mg | ORAL_TABLET | ORAL | Status: DC | PRN

## 2019-03-16 MED ORDER — FENTANYL-BUPIVACAINE-NACL 0.5-0.125-0.9 MG/250ML-% EP SOLN
12.0000 mL/h | EPIDURAL | Status: DC | PRN
  Filled 2019-03-16: qty 250

## 2019-03-16 MED ORDER — SODIUM CHLORIDE (PF) 0.9 % IJ SOLN
INTRAMUSCULAR | Status: DC | PRN
  Administered 2019-03-16: 14 mL/h via EPIDURAL

## 2019-03-16 MED ORDER — MISOPROSTOL 25 MCG QUARTER TABLET
25.0000 ug | ORAL_TABLET | ORAL | Status: DC | PRN
  Administered 2019-03-16 (×2): 25 ug via VAGINAL
  Filled 2019-03-16 (×2): qty 1

## 2019-03-16 MED ORDER — LIDOCAINE HCL (PF) 1 % IJ SOLN
INTRAMUSCULAR | Status: DC | PRN
  Administered 2019-03-16: 6 mL via EPIDURAL

## 2019-03-16 MED ORDER — PENICILLIN G 3 MILLION UNITS IVPB - SIMPLE MED
3.0000 10*6.[IU] | INTRAVENOUS | Status: DC
  Administered 2019-03-16 (×3): 3 10*6.[IU] via INTRAVENOUS
  Filled 2019-03-16 (×3): qty 100

## 2019-03-16 MED ORDER — SODIUM CHLORIDE 0.9 % IV SOLN
5.0000 10*6.[IU] | Freq: Once | INTRAVENOUS | Status: AC
  Administered 2019-03-16: 5 10*6.[IU] via INTRAVENOUS
  Filled 2019-03-16: qty 5

## 2019-03-16 MED ORDER — LACTATED RINGERS IV SOLN
INTRAVENOUS | Status: DC
  Administered 2019-03-16 (×4): via INTRAVENOUS

## 2019-03-16 MED ORDER — OXYCODONE-ACETAMINOPHEN 5-325 MG PO TABS: 2.0000 | ORAL_TABLET | ORAL | Status: DC | PRN

## 2019-03-16 MED ORDER — OXYTOCIN BOLUS FROM INFUSION
500.0000 mL | Freq: Once | INTRAVENOUS | Status: AC
  Administered 2019-03-16: 500 mL via INTRAVENOUS

## 2019-03-16 MED ORDER — LIDOCAINE HCL (PF) 1 % IJ SOLN: 30.0000 mL | INTRAMUSCULAR | Status: DC | PRN

## 2019-03-16 MED ORDER — FLEET ENEMA 7-19 GM/118ML RE ENEM: 1.0000 | ENEMA | RECTAL | Status: DC | PRN

## 2019-03-16 MED ORDER — FENTANYL CITRATE (PF) 100 MCG/2ML IJ SOLN
50.0000 ug | INTRAMUSCULAR | Status: DC | PRN
  Administered 2019-03-16: 100 ug via INTRAVENOUS
  Filled 2019-03-16: qty 2

## 2019-03-16 MED ORDER — LACTATED RINGERS IV SOLN: 500.0000 mL | Freq: Once | INTRAVENOUS | Status: DC

## 2019-03-16 MED ORDER — SOD CITRATE-CITRIC ACID 500-334 MG/5ML PO SOLN
30.0000 mL | ORAL | Status: DC | PRN
  Filled 2019-03-16: qty 30

## 2019-03-16 NOTE — H&P (Signed)
Rose Holden is a 28 y.o. female [id number] @ 41.0wks by 13 wk U/S presenting for IOL due to postdates. Denies bleeding or leaking; no H/A, N/V or visual disturbances. Her preg has been followed by the [facility] service with onset of care at 19.6wks and has been remarkable for 1) +cocaine & THC in Dec 19> neg x 2 with most recent 02/13/19 2) +GC in Feb 20> neg TOC 02/13/19 3) GBS positive 4) hx MDD 5) smoker  OB History    Gravida  7   Para  4   Term  4   Preterm      AB  2   Living  4     SAB      TAB  1   Ectopic      Multiple  0   Live Births  4          Past Medical History:  Diagnosis Date  . Bell's palsy   . Chlamydia   . Depression   . Gonorrhea 06/27/2018   Treated POC__________  . Gonorrhea   . Nausea & vomiting 02/17/2016  . Pollen allergies 02/17/2016  . Pregnant 02/17/2016  . Vaginal discharge 02/17/2016   Past Surgical History:  Procedure Laterality Date  . broken leg     closed reduction   Family History: family history includes Cancer in her maternal grandmother; Glaucoma in her mother; Hypertension in her mother. Social History:  reports that she has been smoking cigarettes. She has a 5.50 pack-year smoking history. She has never used smokeless tobacco. She reports previous alcohol use. She reports previous drug use. Drug: Marijuana.     Maternal Diabetes: No Genetic Screening: Normal Maternal Ultrasounds/Referrals: Normal Fetal Ultrasounds or other Referrals:  None Maternal Substance Abuse:  Yes:  Type: Smoker, Marijuana, Cocaine Significant Maternal Medications:  None Significant Maternal Lab Results:  Lab values include: Group B Strep positive Other Comments:  +GC in Feb>neg TOC on 02/13/19  ROS History Height 5\' 2"  (1.575 m), weight 107.3 kg. Exam Physical Exam  Constitutional: She is oriented to person, place, and time. She appears well-developed.  HENT:  Head: Normocephalic.  Neck: Normal range of motion.  Cardiovascular: Normal rate.   Respiratory: Effort normal.  GI:  EFM 130s, +accels, no decels, Cat 1 Ctx- irreg, mild  Genitourinary:    Genitourinary Comments: Per RN 1/thick/ballot   Musculoskeletal: Normal range of motion.  Neurological: She is alert and oriented to person, place, and time.  Skin: Skin is warm and dry.  Psychiatric: She has a normal mood and affect. Her behavior is normal. Thought content normal.    Prenatal labs: ABO, Rh: A/Positive/-- (12/09 1655) Antibody: Negative (02/12 0834) Rubella: 10.20 (12/09 1655) RPR: Non Reactive (02/12 0834)  HBsAg: Negative (12/09 1655)  HIV: Non Reactive (02/12 0834)  GBS: Positive (04/01 1600)   Assessment/Plan: IUP@41 .0wks Unfavorable cx GBS pos  -Admit to Labor and Delivery -Plan cx ripening with cytotec to start; may repeat doses, add cervical foley, Pit/AROM prn -Plan on SW consult PP due to drug use during preg (most recent UDS neg 02/13/19)   Arabella Merles CNM 03/16/2019, 8:22 AM

## 2019-03-16 NOTE — Anesthesia Preprocedure Evaluation (Signed)
Anesthesia Evaluation  Patient identified by MRN, date of birth, ID band Patient awake    Reviewed: Allergy & Precautions, H&P , NPO status , Patient's Chart, lab work & pertinent test results, reviewed documented beta blocker date and time   Airway Mallampati: II  TM Distance: >3 FB Neck ROM: full    Dental no notable dental hx.    Pulmonary neg pulmonary ROS, Current Smoker,    Pulmonary exam normal breath sounds clear to auscultation       Cardiovascular negative cardio ROS Normal cardiovascular exam Rhythm:regular Rate:Normal     Neuro/Psych negative neurological ROS  negative psych ROS   GI/Hepatic negative GI ROS, Neg liver ROS,   Endo/Other  negative endocrine ROS  Renal/GU negative Renal ROS  negative genitourinary   Musculoskeletal   Abdominal   Peds  Hematology negative hematology ROS (+)   Anesthesia Other Findings   Reproductive/Obstetrics (+) Pregnancy                             Anesthesia Physical Anesthesia Plan  ASA: II  Anesthesia Plan: Epidural   Post-op Pain Management:    Induction:   PONV Risk Score and Plan:   Airway Management Planned:   Additional Equipment:   Intra-op Plan:   Post-operative Plan:   Informed Consent: I have reviewed the patients History and Physical, chart, labs and discussed the procedure including the risks, benefits and alternatives for the proposed anesthesia with the patient or authorized representative who has indicated his/her understanding and acceptance.     Dental Advisory Given  Plan Discussed with: Anesthesiologist  Anesthesia Plan Comments: (Labs checked- platelets confirmed with RN in room. Fetal heart tracing, per RN, reported to be stable enough for sitting procedure. Discussed epidural, and patient consents to the procedure:  included risk of possible headache,backache, failed block, allergic reaction, and nerve  injury. This patient was asked if she had any questions or concerns before the procedure started.)        Anesthesia Quick Evaluation

## 2019-03-16 NOTE — Progress Notes (Signed)
OB/GYN Faculty Practice: Labor Progress Note  Subjective: Doing well, comfortable. Not feeling any more pressure. Into room to discuss with patient change of shift, Dr. Sheran Fava to assume care at signout.   Objective: BP 130/63 (BP Location: Left Arm)   Pulse 90   Temp 98.3 F (36.8 C) (Axillary)   Resp 20   Ht 5\' 2"  (1.575 m)   Wt 107.3 kg   LMP  (LMP Unknown)   SpO2 100%   BMI 43.27 kg/m  Gen: well-appearing, NAD Dilation: 5 Effacement (%): 70 Cervical Position: Posterior Station: -3 Presentation: Vertex Exam by:: Dr. Earlene Plater  Assessment and Plan: 28 y.o. [id number] [redacted]w[redacted]d here for post-dates induction.   Labor: Contractions were more than adequate when IUPC placed. Recent check by RN and cervix still posterior. Terbutaline given with improvement in FHR. Continue to monitor and start pitocin when able.  -- pain control: epidural -- PPH Risk: medium (given 5th baby)  Fetal Well-Being: EFW 8-9lbs by Leopolds. Cephalic by sutures.  -- Category II - continuous fetal monitoring - trying position changes for deep variables with contractions, could consider amnioinfusion if no improvement, excellent and reassuring variability in between contractions  -- GBS positive - adequate treatment > 2 doses   GHTN: New onset intrapartum. Asymptomatic. CMP and CBC wnl.  -- continue to monitor closely    Delno Blaisdell S. Earlene Plater, DO OB/GYN Fellow, Faculty Practice  7:56 PM

## 2019-03-16 NOTE — Progress Notes (Signed)
Interval Progress Note  Plan of care discussed with RN. No change on recent cervical exam though patient was feeling more rectal pressure. Vitals stable. MVUs well over 200, close to 300 at times and persistent variables and late decelerations. Will give IM terbutaline, continue position changes for this persistent category II tracing then reassess. Deferring amnioinfusion at this time.   Cristal Deer. Earlene Plater, DO OB/GYN Fellow

## 2019-03-16 NOTE — Anesthesia Procedure Notes (Signed)
Epidural Patient location during procedure: OB Start time: 03/16/2019 4:25 PM End time: 03/16/2019 4:30 PM  Staffing Anesthesiologist: Bethena Midget, MD  Preanesthetic Checklist Completed: patient identified, site marked, surgical consent, pre-op evaluation, timeout performed, IV checked, risks and benefits discussed and monitors and equipment checked  Epidural Patient position: sitting Prep: site prepped and draped and DuraPrep Patient monitoring: continuous pulse ox and blood pressure Approach: midline Location: L3-L4 Injection technique: LOR air  Needle:  Needle type: Tuohy  Needle gauge: 17 G Needle length: 9 cm and 9 Needle insertion depth: 6 cm Catheter type: closed end flexible Catheter size: 19 Gauge Catheter at skin depth: 11 cm Test dose: negative  Assessment Events: blood not aspirated, injection not painful, no injection resistance, negative IV test and no paresthesia

## 2019-03-16 NOTE — Progress Notes (Signed)
OB/GYN Faculty Practice: Labor Progress Note  Subjective: Doing well, has started to feel occasional contractions in last hour or so.   Objective: BP (!) 143/87   Pulse 76   Resp 17   Ht 5\' 2"  (1.575 m)   Wt 107.3 kg   LMP  (LMP Unknown)   BMI 43.27 kg/m  Gen: well-appearing, NAD Dilation: 2 Effacement (%): 50 Station: Ballotable Presentation: Vertex Exam by:: wallace DO  Assessment and Plan: 28 y.o. [id number] [redacted]w[redacted]d here for post-dates induction of labor.   Labor: Counseled on use of FB to help with cervical ripening, patient amenable. Placed without difficulty. Also given 2nd dose of cytotec.  -- pain control: well-controlled at this time, open to epidural  -- PPH Risk: low  Fetal Well-Being: EFW 8-9lbs by Leopolds. Cephalic by sutures.  -- Category I - continuous fetal monitoring - 140s-150s/mod/+a/occasional variable resolves on own  -- GBS positive - PCN    Rose S. Earlene Plater, DO OB/GYN Fellow, Faculty Practice  12:47 PM

## 2019-03-16 NOTE — Progress Notes (Signed)
OB/GYN Faculty Practice: Labor Progress Note  Subjective: Doing well, just received epidural.   Objective: BP 133/82   Pulse 70   Temp 98.6 F (37 C) (Oral)   Resp 20   Ht 5\' 2"  (1.575 m)   Wt 107.3 kg   LMP  (LMP Unknown)   SpO2 100%   BMI 43.27 kg/m  Gen: well-appearing, NAD, talking on phone Dilation: 5 Effacement (%): 70 Cervical Position: Posterior Station: -3 Presentation: Vertex Exam by:: Henderson Newcomer, RN  Assessment and Plan: 28 y.o. [id number] [redacted]w[redacted]d here for post-dates induction.   Labor: FB came out, received 2 doses of cytotec. Recently got epidural and has been having occasional late decels and variables. BP are now in normal range so may be related to perfusion - will give phenylephrine. Discussed AROM and placing internal monitors with patient to help with induction, monitoring of infant and contraction strength/freuqncy. Patient amenable. AROM clear fluid, cervix still extremely posterior but thinning out and now 5-6 cm. IUPC and FSE placed without difficulty.  -- pain control: epidural -- PPH Risk: medium (given 5th baby)  Fetal Well-Being: EFW 8-9lbs by Leopolds. Cephalic by sutures.  -- Category II - continuous fetal monitoring - given BP support now, will try terbutaline as next intervention if no improvement, continue position changes - updated Dr. Jolayne Panther re: FHR tracing -- GBS positive - adequate treatment > 2 doses   GHTN: New onset intrapartum. Asymptomatic. CMP and CBC wnl.  -- continue to monitor closely    Rose S. Earlene Plater, DO OB/GYN Fellow, Faculty Practice  5:21 PM

## 2019-03-17 ENCOUNTER — Encounter (HOSPITAL_COMMUNITY): Payer: Self-pay

## 2019-03-17 MED ORDER — ZOLPIDEM TARTRATE 5 MG PO TABS: 5.0000 mg | ORAL_TABLET | Freq: Every evening | ORAL | Status: DC | PRN

## 2019-03-17 MED ORDER — SIMETHICONE 80 MG PO CHEW: 80.0000 mg | CHEWABLE_TABLET | ORAL | Status: DC | PRN

## 2019-03-17 MED ORDER — WITCH HAZEL-GLYCERIN EX PADS: 1.0000 "application " | MEDICATED_PAD | CUTANEOUS | Status: DC | PRN

## 2019-03-17 MED ORDER — BENZOCAINE-MENTHOL 20-0.5 % EX AERO
1.0000 "application " | INHALATION_SPRAY | CUTANEOUS | Status: DC | PRN
  Administered 2019-03-17: 1 via TOPICAL
  Filled 2019-03-17: qty 56

## 2019-03-17 MED ORDER — COCONUT OIL OIL: 1.0000 "application " | TOPICAL_OIL | Status: DC | PRN

## 2019-03-17 MED ORDER — IBUPROFEN 600 MG PO TABS
600.0000 mg | ORAL_TABLET | Freq: Four times a day (QID) | ORAL | Status: DC
  Administered 2019-03-17 – 2019-03-18 (×8): 600 mg via ORAL
  Filled 2019-03-17 (×8): qty 1

## 2019-03-17 MED ORDER — DIBUCAINE (PERIANAL) 1 % EX OINT: 1.0000 "application " | TOPICAL_OINTMENT | CUTANEOUS | Status: DC | PRN

## 2019-03-17 MED ORDER — ONDANSETRON HCL 4 MG/2ML IJ SOLN: 4.0000 mg | INTRAMUSCULAR | Status: DC | PRN

## 2019-03-17 MED ORDER — SENNOSIDES-DOCUSATE SODIUM 8.6-50 MG PO TABS
2.0000 | ORAL_TABLET | ORAL | Status: DC
  Administered 2019-03-17: 2 via ORAL
  Filled 2019-03-17: qty 2

## 2019-03-17 MED ORDER — MEASLES, MUMPS & RUBELLA VAC IJ SOLR: 0.5000 mL | Freq: Once | INTRAMUSCULAR | Status: DC

## 2019-03-17 MED ORDER — TETANUS-DIPHTH-ACELL PERTUSSIS 5-2.5-18.5 LF-MCG/0.5 IM SUSP: 0.5000 mL | Freq: Once | INTRAMUSCULAR | Status: DC

## 2019-03-17 MED ORDER — DIPHENHYDRAMINE HCL 25 MG PO CAPS: 25.0000 mg | ORAL_CAPSULE | Freq: Four times a day (QID) | ORAL | Status: DC | PRN

## 2019-03-17 MED ORDER — ACETAMINOPHEN 325 MG PO TABS: 650.0000 mg | ORAL_TABLET | ORAL | Status: DC | PRN

## 2019-03-17 MED ORDER — PRENATAL MULTIVITAMIN CH
1.0000 | ORAL_TABLET | Freq: Every day | ORAL | Status: DC
  Administered 2019-03-17 – 2019-03-18 (×2): 1 via ORAL
  Filled 2019-03-17 (×2): qty 1

## 2019-03-17 MED ORDER — ONDANSETRON HCL 4 MG PO TABS: 4.0000 mg | ORAL_TABLET | ORAL | Status: DC | PRN

## 2019-03-17 NOTE — Discharge Summary (Signed)
OB Discharge Summary     Patient Name: Rose Holden DOB: [birth year] MRN: [id number]  Date of admission: 03/16/2019 Delivering MD: Tamera Stands   Date of discharge: 03/17/2019  Admitting diagnosis: pregnancy Intrauterine pregnancy: [redacted]w[redacted]d     Secondary diagnosis:  Active Problems:   Late prenatal care in second trimester   Smoker   Cocaine use complicating pregnancy, antepartum   Marijuana use   Gonorrhea   Post-dates pregnancy   SVD (spontaneous vaginal delivery)      Discharge diagnosis: Term Pregnancy Delivered and Gestational Hypertension                                                                                                Post partum procedures:none  Augmentation: AROM, Cytotec and Foley Balloon  Complications: None  Hospital course:  Induction of Labor With Vaginal Delivery   28 y.o. yo [id number] at [redacted]w[redacted]d was admitted to the hospital 03/16/2019 for induction of labor.  Indication for induction: Postdates. During labor patient developed intrapartum gHTN, negative PIH labs and asymptomatic.  Patient had an uncomplicated labor course as follows: Membrane Rupture Time/Date: 5:14 PM ,03/16/2019   Intrapartum Procedures: Episiotomy: None [1]                                         Lacerations:  None [1]  Patient had delivery of a Viable infant.  Information for the patient's newborn:  Lamae, Sekerak Tanza [[id number]]      03/16/2019  Details of delivery can be found in separate delivery note.  Patient had a routine postpartum course. Patient is discharged home 03/17/19.  Physical exam  Vitals:   03/16/19 2132 03/16/19 2202 03/16/19 2232 03/16/19 2302  BP: (!) 147/65 117/69 (!) 92/48 115/70  Pulse: 93 82 90 84  Resp: 20 20 20 20   Temp: 98.5 F (36.9 C)     TempSrc: Axillary     SpO2:      Weight:      Height:       General: alert Lochia: appropriate Uterine Fundus: firm Incision: N/A DVT Evaluation: No evidence of DVT seen on physical exam. Labs: Lab  Results  Component Value Date   WBC 6.3 03/16/2019   HGB 10.3 (L) 03/16/2019   HCT 31.2 (L) 03/16/2019   MCV 85.7 03/16/2019   PLT 296 03/16/2019   CMP Latest Ref Rng & Units 03/16/2019  Glucose 70 - 99 mg/dL 86  BUN 6 - 20 mg/dL <4(H)  Creatinine 9.97 - 1.00 mg/dL 7.41  Sodium 423 - 953 mmol/L 138  Potassium 3.5 - 5.1 mmol/L 3.9  Chloride 98 - 111 mmol/L 108  CO2 22 - 32 mmol/L 22  Calcium 8.9 - 10.3 mg/dL 9.8  Total Protein 6.5 - 8.1 g/dL 5.2(L)  Total Bilirubin 0.3 - 1.2 mg/dL 0.4  Alkaline Phos 38 - 126 U/L 98  AST 15 - 41 U/L 18  ALT 0 - 44 U/L 13    Discharge instruction: per After Visit Summary  and "Baby and Me Booklet".  After visit meds:    Diet: routine diet  Activity: Advance as tolerated. Pelvic rest for 6 weeks.   Outpatient follow up:4 weeks Follow up Appt:No future appointments. Follow up Visit:No follow-ups on file.  Please schedule this patient for Postpartum visit in: 4 weeks with the following provider: Any provider For C/S patients schedule nurse incision check in weeks 2 weeks: no High risk pregnancy complicated by: cocaine use/THC/tobacco use in pregnancy, depression, intrapartum gHTN, +gonorrhea with neg TOC  Delivery mode:  SVD Anticipated Birth Control:  Plans Interval BTL PP Procedures needed: BP check  Schedule Integrated BH visit: no  Postpartum contraception: Tubal Ligation, interval She declines contraception until BTL, plans abstinence  Newborn Data: Live born female  Birth Weight: 7 lb 10.2 oz (3464 g) APGAR: 7, 9  Newborn Delivery   Birth date/time:  03/16/2019 23:38:00 Delivery type:  Vaginal, Spontaneous     Baby Feeding: Bottle Disposition:home with mother   03/17/2019 De Hollingsheadatherine L Wallace, DO

## 2019-03-17 NOTE — Anesthesia Postprocedure Evaluation (Signed)
Anesthesia Post Note  Patient: Rose Holden  Procedure(s) Performed: AN AD HOC LABOR EPIDURAL     Patient location during evaluation: Mother Baby Anesthesia Type: Epidural Level of consciousness: awake and alert, oriented and patient cooperative Pain management: pain level controlled Vital Signs Assessment: post-procedure vital signs reviewed and stable Respiratory status: spontaneous breathing Cardiovascular status: stable Postop Assessment: no headache, epidural receding, patient able to bend at knees and no signs of nausea or vomiting Anesthetic complications: no Comments: Pt. Interviewed via Audiological scientist d/t COVID 19.  Pt. States she is walking and pain score is 3.    Last Vitals:  Vitals:   03/17/19 1045 03/17/19 1437  BP: 132/86 131/84  Pulse: 78 77  Resp: 16 20  Temp: 36.7 C 37.2 C  SpO2:      Last Pain:  Vitals:   03/17/19 1715  TempSrc:   PainSc: 3    Pain Goal:                   [facility]

## 2019-03-17 NOTE — Progress Notes (Signed)
POSTPARTUM PROGRESS NOTE  Post Partum Day 1  Subjective:  Rose Holden is a 28 y.o. [id number] s/p SVD at [redacted]w[redacted]d.  She reports she is doing well. No acute events overnight. She denies any problems with ambulating, voiding or po intake. Denies nausea or vomiting.  Pain is well controlled.  Lochia is appropriate.  Objective: Blood pressure 129/90, pulse 88, temperature 98 F (36.7 C), temperature source Oral, resp. rate 18, height 5\' 2"  (1.575 m), weight 107.3 kg, SpO2 98 %, unknown if currently breastfeeding.  Physical Exam:  General: alert, cooperative and no distress Chest: no respiratory distress Heart:regular rate, distal pulses intact Abdomen: soft, nontender,  Uterine Fundus: firm, appropriately tender DVT Evaluation: No calf swelling or tenderness Extremities: No LE edema Skin: warm, dry  Recent Labs    03/16/19 0806  HGB 10.3*  HCT 31.2*    Assessment/Plan: Rose Holden is a 28 y.o. L8V5643 s/p SVD at [redacted]w[redacted]d   PPD#1 - Doing well  Routine postpartum care Intrapartum gHTN: BPs stable.  Contraception: interval BTL, offer Depo prior to discharge.  Feeding: breast Dispo: Plan for discharge PPD#2.   LOS: 1 day   Marcy Siren, D.O. OB Fellow  03/17/2019, 8:15 AM

## 2019-03-17 NOTE — Clinical Social Work Maternal (Signed)
CLINICAL SOCIAL WORK MATERNAL/CHILD NOTE  Patient Details  Name: Rose Holden MRN: [id number] Date of Birth: 11/01/1991  Date:  03/17/2019  Clinical Social Worker Initiating Note:  Makael Stein, LCSW     Date/Time: Initiated:  03/17/19/1125             Child's Name:  Rose Holden   Biological Parents:  Mother   Need for Interpreter:  None   Reason for Referral:  Current Substance Use/Substance Use During Pregnancy , Behavioral Health Concerns   Address:  [address] [place] [address]    Phone number:  [phone] (home)     Additional phone number:   Household Members/Support Persons (HM/SP):   Household Member/Support Person 1, Household Member/Support Person 2, Household Member/Support Person 3, Household Member/Support Person 4   HM/SP Name Relationship DOB or Age  HM/SP -1 Rose Holden daughter 08-31-17  HM/SP -2 Rose Holden son  04-26-11  HM/SP -3 Rose Holden daughter  06/27/13  HM/SP -4 Rose Holden son 09-08-16  HM/SP -5     HM/SP -6     HM/SP -7     HM/SP -8       Natural Supports (not living in the home): Parent, Immediate Family, Extended Family   Professional Supports:None   Employment:Unemployed   Type of Work:     Education:  Other (comment)(GED)   Homebound arranged:    Financial Resources:Medicaid   Other Resources: WIC, Food Stamps (Plans to renew WIC)   Cultural/Religious Considerations Which May Impact Care:  Strengths: Ability to meet basic needs , Home prepared for child    Psychotropic Medications:         Pediatrician:       Pediatrician List:   Rose Holden   [place]   [place]   [place]   [place]   [place]     Pediatrician Fax Number:    Risk Factors/Current Problems: Substance Use    Cognitive State: Able to Concentrate , Alert , Linear Thinking , Goal Oriented    Mood/Affect: Calm , Comfortable , Interested     CSW Assessment:CSW spoke with MOB at bedside regarding consult for substance use. CSW introduced self and explained reason for consult. MOB was welcoming and engaged during assessment. MOB reported that she resides with her four older children. MOB reported the she is unemployed and received food stamps. MOB reported that she plans to renew her WIC. MOB reported that she has all items needed to care for infant at home. CSW inquired about MOB's support system, MOB reported that her mom, brother, sister and aunts are her supports. MOB declined to share FOB's name.   CSW inquired about MOB's mental health history, MOB denied any mental health history and denied history of postpartum depression. Per chart review, MOB had a [facility] admission in 2015;; MOB did not bring up this admission. No mental health concerns noted in OB records. MOB presented calm and did not demonstrate any acute mental health signs/symptoms. CSW assessed for safety, MOB denied SI, HI and domestic violence.   CSW provided education regarding the baby blues period vs. perinatal mood disorders, discussed treatment and gave resources for mental health follow up if concerns arise.  CSW recommends self-evaluation during the postpartum time period using the New Mom Checklist from Postpartum Progress and encouraged MOB to contact a medical professional if symptoms are noted at any time.    CSW informed MOB about hospital drug policy and inquired about substance use during   pregnancy. MOB reported that she used marijuana and cocaine but was unable to recall the last use. MOB reported that her last use has been a while. Per chart review, MOB's UDS was positive for marijuana and cocaine 10/19/18, MOB's UDS was negative on 02/05/19 and 02/13/19. MOB denied any other drug use. CSW informed MOB that infant's UDS and CDS would continue to be monitored and a CPS report would be made if warranted. MOB denied any CPS history. CSW asked MOB if she was  interested in substance use treatment resources, MOB declined.   CSW provided review of Sudden Infant Death Syndrome (SIDS) precautions. MOB verbalized understanding and reported that infant will sleep in a basinet.    CSW identifies no further need for intervention and no barriers to discharge at this time.  CSW Plan/Description: No Further Intervention Required/No Barriers to Discharge, Perinatal Mood and Anxiety Disorder (PMADs) Education, Sudden Infant Death Syndrome (SIDS) Education, Hospital Drug Screen Policy Information, CSW Will Continue to Monitor Umbilical Cord Tissue Drug Screen Results and Make Report if Warranted    Nakshatra Klose L Myeisha Kruser, LCSW 03/17/2019, 3:12 PM           

## 2019-03-18 DIAGNOSIS — Z3A41 41 weeks gestation of pregnancy: Secondary | ICD-10-CM

## 2019-03-18 DIAGNOSIS — O99824 Streptococcus B carrier state complicating childbirth: Secondary | ICD-10-CM

## 2019-03-18 DIAGNOSIS — O480 Post-term pregnancy: Secondary | ICD-10-CM

## 2019-03-18 MED ORDER — IBUPROFEN 600 MG PO TABS: 600.0000 mg | ORAL_TABLET | Freq: Four times a day (QID) | ORAL | 1 refills | Status: DC | PRN

## 2019-03-18 NOTE — Discharge Instructions (Signed)
Vaginal Delivery, Care After °Refer to this sheet in the next few weeks. These instructions provide you with information about caring for yourself after vaginal delivery. Your health care provider may also give you more specific instructions. Your treatment has been planned according to current medical practices, but problems sometimes occur. Call your health care provider if you have any problems or questions. °What can I expect after the procedure? °After vaginal delivery, it is common to have: °· Some bleeding from your vagina. °· Soreness in your abdomen, your vagina, and the area of skin between your vaginal opening and your anus (perineum). °· Pelvic cramps. °· Fatigue. °Follow these instructions at home: °Medicines °· Take over-the-counter and prescription medicines only as told by your health care provider. °· If you were prescribed an antibiotic medicine, take it as told by your health care provider. Do not stop taking the antibiotic until it is finished. °Driving ° °· Do not drive or operate heavy machinery while taking prescription pain medicine. °· Do not drive for 24 hours if you received a sedative. °Lifestyle °· Do not drink alcohol. This is especially important if you are breastfeeding or taking medicine to relieve pain. °· Do not use tobacco products, including cigarettes, chewing tobacco, or e-cigarettes. If you need help quitting, ask your health care provider. °Eating and drinking °· Drink at least 8 eight-ounce glasses of water every day unless you are told not to by your health care provider. If you choose to breastfeed your baby, you may need to drink more water than this. °· Eat high-fiber foods every day. These foods may help prevent or relieve constipation. High-fiber foods include: °? Whole grain cereals and breads. °? Brown rice. °? Beans. °? Fresh fruits and vegetables. °Activity °· Return to your normal activities as told by your health care provider. Ask your health care provider what  activities are safe for you. °· Rest as much as possible. Try to rest or take a nap when your baby is sleeping. °· Do not lift anything that is heavier than your baby or 10 lb (4.5 kg) until your health care provider says that it is safe. °· Talk with your health care provider about when you can engage in sexual activity. This may depend on your: °? Risk of infection. °? Rate of healing. °? Comfort and desire to engage in sexual activity. °Vaginal Care °· If you have an episiotomy or a vaginal tear, check the area every day for signs of infection. Check for: °? More redness, swelling, or pain. °? More fluid or blood. °? Warmth. °? Pus or a bad smell. °· Do not use tampons or douches until your health care provider says this is safe. °· Watch for any blood clots that may pass from your vagina. These may look like clumps of dark red, brown, or black discharge. °General instructions °· Keep your perineum clean and dry as told by your health care provider. °· Wear loose, comfortable clothing. °· Wipe from front to back when you use the toilet. °· Ask your health care provider if you can shower or take a bath. If you had an episiotomy or a perineal tear during labor and delivery, your health care provider may tell you not to take baths for a certain length of time. °· Wear a bra that supports your breasts and fits you well. °· If possible, have someone help you with household activities and help care for your baby for at least a few days after you   leave the hospital. °· Keep all follow-up visits for you and your baby as told by your health care provider. This is important. °Contact a health care provider if: °· You have: °? Vaginal discharge that has a bad smell. °? Difficulty urinating. °? Pain when urinating. °? A sudden increase or decrease in the frequency of your bowel movements. °? More redness, swelling, or pain around your episiotomy or vaginal tear. °? More fluid or blood coming from your episiotomy or vaginal  tear. °? Pus or a bad smell coming from your episiotomy or vaginal tear. °? A fever. °? A rash. °? Little or no interest in activities you used to enjoy. °? Questions about caring for yourself or your baby. °· Your episiotomy or vaginal tear feels warm to the touch. °· Your episiotomy or vaginal tear is separating or does not appear to be healing. °· Your breasts are painful, hard, or turn red. °· You feel unusually sad or worried. °· You feel nauseous or you vomit. °· You pass large blood clots from your vagina. If you pass a blood clot from your vagina, save it to show to your health care provider. Do not flush blood clots down the toilet without having your health care provider look at them. °· You urinate more than usual. °· You are dizzy or light-headed. °· You have not breastfed at all and you have not had a menstrual period for 12 weeks after delivery. °· You have stopped breastfeeding and you have not had a menstrual period for 12 weeks after you stopped breastfeeding. °Get help right away if: °· You have: °? Pain that does not go away or does not get better with medicine. °? Chest pain. °? Difficulty breathing. °? Blurred vision or spots in your vision. °? Thoughts about hurting yourself or your baby. °· You develop pain in your abdomen or in one of your legs. °· You develop a severe headache. °· You faint. °· You bleed from your vagina so much that you fill two sanitary pads in one hour. °This information is not intended to replace advice given to you by your health care provider. Make sure you discuss any questions you have with your health care provider. °Document Released: 10/28/2000 Document Revised: 04/13/2016 Document Reviewed: 11/15/2015 °Elsevier Interactive Patient Education © 2019 [employer] ° °

## 2019-03-18 NOTE — Plan of Care (Signed)
Patient Appropriate for discharge  ?

## 2019-03-18 NOTE — Progress Notes (Signed)
CSW spoke with  Christina from [place] CPS. CSW was advised that there are no open CPS cases on MOB at this time. CSW will continue to follow and monitor infants CDS at this time.     Antoninette Lerner S. Natina Wiginton, MSW, LCSW-A [facility] [phone] 

## 2019-04-02 ENCOUNTER — Other Ambulatory Visit: Payer: Self-pay

## 2019-04-02 ENCOUNTER — Encounter: Payer: Self-pay | Admitting: *Deleted

## 2019-04-02 ENCOUNTER — Ambulatory Visit: Payer: Medicaid Other | Admitting: *Deleted

## 2019-04-02 ENCOUNTER — Other Ambulatory Visit: Payer: Self-pay | Admitting: Women's Health

## 2019-04-02 VITALS — BP 127/85 | HR 76

## 2019-04-02 DIAGNOSIS — Z0130 Encounter for examination of blood pressure without abnormal findings: Secondary | ICD-10-CM

## 2019-04-02 NOTE — Progress Notes (Signed)
Pt here for BP check. BP today is 127/85. Pt has a headache today. I spoke with Selena Batten, CNM and she advised BP is fine. Can take Tylenol or Ibuprofen for headache. Pt voiced understanding. JSY

## 2019-04-22 ENCOUNTER — Ambulatory Visit: Payer: Medicaid Other | Admitting: Obstetrics & Gynecology

## 2019-04-26 ENCOUNTER — Ambulatory Visit: Payer: Medicaid Other | Admitting: Obstetrics & Gynecology

## 2019-05-02 ENCOUNTER — Ambulatory Visit (INDEPENDENT_AMBULATORY_CARE_PROVIDER_SITE_OTHER): Payer: Medicaid Other | Admitting: Obstetrics & Gynecology

## 2019-05-02 ENCOUNTER — Other Ambulatory Visit: Payer: Self-pay

## 2019-05-02 ENCOUNTER — Encounter: Payer: Self-pay | Admitting: Obstetrics & Gynecology

## 2019-05-02 DIAGNOSIS — Z3009 Encounter for other general counseling and advice on contraception: Secondary | ICD-10-CM

## 2019-05-02 NOTE — Progress Notes (Signed)
Patient ID: Rose Holden, female   DOB: 03-31-1991, 28 y.o.   MRN: [id number]   TELEHEALTH VIRTUAL GYNECOLOGY VISIT ENCOUNTER NOTE  I connected with Ermagene D Leibold on 05/02/19 at  3:15 PM EDT by telephone at home and verified that I am speaking with the correct person using two identifiers.   I discussed the limitations, risks, security and privacy concerns of performing an evaluation and management service by telephone and the availability of in person appointments. I also discussed with the patient that there may be a patient responsible charge related to this service. The patient expressed understanding and agreed to proceed.   History:  Rose Holden is a 28 y.o. [phone] female being evaluated today for postpartum visit and discuss tubal. She denies any abnormal vaginal discharge, bleeding, pelvic pain or other concerns.       Past Medical History:  Diagnosis Date  . Bell's palsy   . Chlamydia   . Depression   . Gonorrhea 06/27/2018   Treated POC__________  . Gonorrhea   . Nausea & vomiting 02/17/2016  . Pollen allergies 02/17/2016  . Pregnant 02/17/2016  . Vaginal discharge 02/17/2016   Past Surgical History:  Procedure Laterality Date  . broken leg     closed reduction   The following portions of the patient's history were reviewed and updated as appropriate: allergies, current medications, past family history, past medical history, past social history, past surgical history and problem list.   Health Maintenance:  Normal pap and negative HRHPV on .  Normal mammogram on .   Review of Systems:  Pertinent items noted in HPI and remainder of comprehensive ROS otherwise negative.  Physical Exam:  Physical exam deferred due to nature of the encounter  Labs and Imaging No results found for this or any previous visit (from the past 336 hour(s)). No results found.     No orders of the defined types were placed in this encounter.   No orders of the defined types were placed in  this encounter.   Assessment and Plan:       ICD-10-CM   1. Sterilization consult  Z30.09    Lap BTL 05/29/2019  2. Postpartum state  Z39.2          I discussed the assessment and treatment plan with the patient. The patient was provided an opportunity to ask questions and all were answered. The patient agreed with the plan and demonstrated an understanding of the instructions.   The patient was advised to call back or seek an in-person evaluation/go to the ED if the symptoms worsen or if the condition fails to improve as anticipated.  I provided 12 minutes of non-face-to-face time during this encounter.   Florian Buff, [place] for [facility]

## 2019-05-21 NOTE — Patient Instructions (Signed)
Rose Holden  05/21/2019     @PREFPERIOPPHARMACY @   Your procedure is scheduled on  05/29/2019.  Report to [facility] at  1020  A.M.  Call this number if you have problems the morning of surgery:  [phone]   Remember:  Do not eat or drink after midnight.                         Take these medicines the morning of surgery with A SIP OF WATER  None    Do not wear jewelry, make-up or nail polish.  Do not wear lotions, powders, or perfumes, or deodorant.  Do not shave 48 hours prior to surgery.  Men may shave face and neck.  Do not bring valuables to the hospital.  [facility] is not responsible for any belongings or valuables.  Contacts, dentures or bridgework may not be worn into surgery.  Leave your suitcase in the car.  After surgery it may be brought to your room.  For patients admitted to the hospital, discharge time will be determined by your treatment team.  Patients discharged the day of surgery will not be allowed to drive home.   Name and phone number of your driver:   family Special instructions:  None  Please read over the following fact sheets that you were given. Anesthesia Post-op Instructions and Care and Recovery After Surgery       Laparoscopic Tubal Ligation, Care After This sheet gives you information about how to care for yourself after your procedure. Your health care provider may also give you more specific instructions. If you have problems or questions, contact your health care provider. What can I expect after the procedure? After the procedure, it is common to have:  A sore throat.  Discomfort in your shoulder.  Mild discomfort or cramping in your abdomen.  Gas pains.  Pain or soreness in the area where the surgical incision was made.  A bloated feeling.  Tiredness.  Nausea.  Vomiting. Follow these instructions at home: Medicines  Take over-the-counter and prescription medicines only as told by your health care  provider.  Do not take aspirin because it can cause bleeding.  Ask your health care provider if the medicine prescribed to you: ? Requires you to avoid driving or using heavy machinery. ? Can cause constipation. You may need to take actions to prevent or treat constipation, such as:  Drink enough fluid to keep your urine pale yellow.  Take over-the-counter or prescription medicines.  Eat foods that are high in fiber, such as beans, whole grains, and fresh fruits and vegetables.  Limit foods that are high in fat and processed sugars, such as fried or sweet foods. Incision care      Follow instructions from your health care provider about how to take care of your incision. Make sure you: ? Wash your hands with soap and water before and after you change your bandage (dressing). If soap and water are not available, use hand sanitizer. ? Change your dressing as told by your health care provider. ? Leave stitches (sutures), skin glue, or adhesive strips in place. These skin closures may need to stay in place for 2 weeks or longer. If adhesive strip edges start to loosen and curl up, you may trim the loose edges. Do not remove adhesive strips completely unless your health care provider tells you to do that.  Check your incision  area every day for signs of infection. Check for: ? Redness, swelling, or pain. ? Fluid or blood. ? Warmth. ? Pus or a bad smell. Activity  Rest as told by your health care provider.  Avoid sitting for a long time without moving. Get up to take short walks every 1-2 hours. This is important to improve blood flow and breathing. Ask for help if you feel weak or unsteady.  Return to your normal activities as told by your health care provider. Ask your health care provider what activities are safe for you. General instructions  Do not take baths, swim, or use a hot tub until your health care provider approves. Ask your health care provider if you may take showers.  You may only be allowed to take sponge baths.  Have someone help you with your daily household tasks for the first few days.  Keep all follow-up visits as told by your health care provider. This is important. Contact a health care provider if:  You have redness, swelling, or pain around your incision.  Your incision feels warm to the touch.  You have pus or a bad smell coming from your incision.  The edges of your incision break open after the sutures have been removed.  Your pain does not improve after 2-3 days.  You have a rash.  You repeatedly become dizzy or light-headed.  Your pain medicine is not helping. Get help right away if you:  Have a fever.  Faint.  Have increasing pain in your abdomen.  Have severe pain in one or both of your shoulders.  Have fluid or blood coming from your sutures or from your vagina.  Have shortness of breath or difficulty breathing.  Have chest pain or leg pain.  Have ongoing nausea, vomiting, or diarrhea. Summary  After the procedure, it is common to have mild discomfort or cramping in your abdomen.  Take over-the-counter and prescription medicines only as told by your health care provider.  Watch for symptoms that should prompt you to call your health care provider.  Keep all follow-up visits as told by your health care provider. This is important. This information is not intended to replace advice given to you by your health care provider. Make sure you discuss any questions you have with your health care provider. Document Released: 05/20/2005 Document Revised: 09/25/2018 Document Reviewed: 09/25/2018 Elsevier Patient Education  2020 [place] Anesthesia, Adult, Care After This sheet gives you information about how to care for yourself after your procedure. Your health care provider may also give you more specific instructions. If you have problems or questions, contact your health care provider. What can I  expect after the procedure? After the procedure, the following side effects are common:  Pain or discomfort at the IV site.  Nausea.  Vomiting.  Sore throat.  Trouble concentrating.  Feeling cold or chills.  Weak or tired.  Sleepiness and fatigue.  Soreness and body aches. These side effects can affect parts of the body that were not involved in surgery. Follow these instructions at home:  For at least 24 hours after the procedure:  Have a responsible adult stay with you. It is important to have someone help care for you until you are awake and alert.  Rest as needed.  Do not: ? Participate in activities in which you could fall or become injured. ? Drive. ? Use heavy machinery. ? Drink alcohol. ? Take sleeping pills or medicines that cause drowsiness. ? Make  important decisions or sign legal documents. ? Take care of children on your own. Eating and drinking  Follow any instructions from your health care provider about eating or drinking restrictions.  When you feel hungry, start by eating small amounts of foods that are soft and easy to digest (bland), such as toast. Gradually return to your regular diet.  Drink enough fluid to keep your urine pale yellow.  If you vomit, rehydrate by drinking water, juice, or clear broth. General instructions  If you have sleep apnea, surgery and certain medicines can increase your risk for breathing problems. Follow instructions from your health care provider about wearing your sleep device: ? Anytime you are sleeping, including during daytime naps. ? While taking prescription pain medicines, sleeping medicines, or medicines that make you drowsy.  Return to your normal activities as told by your health care provider. Ask your health care provider what activities are safe for you.  Take over-the-counter and prescription medicines only as told by your health care provider.  If you smoke, do not smoke without supervision.  Keep  all follow-up visits as told by your health care provider. This is important. Contact a health care provider if:  You have nausea or vomiting that does not get better with medicine.  You cannot eat or drink without vomiting.  You have pain that does not get better with medicine.  You are unable to pass urine.  You develop a skin rash.  You have a fever.  You have redness around your IV site that gets worse. Get help right away if:  You have difficulty breathing.  You have chest pain.  You have blood in your urine or stool, or you vomit blood. Summary  After the procedure, it is common to have a sore throat or nausea. It is also common to feel tired.  Have a responsible adult stay with you for the first 24 hours after general anesthesia. It is important to have someone help care for you until you are awake and alert.  When you feel hungry, start by eating small amounts of foods that are soft and easy to digest (bland), such as toast. Gradually return to your regular diet.  Drink enough fluid to keep your urine pale yellow.  Return to your normal activities as told by your health care provider. Ask your health care provider what activities are safe for you. This information is not intended to replace advice given to you by your health care provider. Make sure you discuss any questions you have with your health care provider. Document Released: 02/06/2001 Document Revised: 11/03/2017 Document Reviewed: 06/16/2017 Elsevier Patient Education  2020 [employer] How to Use Chlorhexidine for Bathing Chlorhexidine gluconate (CHG) is a germ-killing (antiseptic) solution that is used to clean the skin. It can get rid of the bacteria that normally live on the skin and can keep them away for about 24 hours. To clean your skin with CHG, you may be given:  A CHG solution to use in the shower or as part of a sponge bath.  A prepackaged cloth that contains CHG. Cleaning your skin with CHG  may help lower the risk for infection:  While you are staying in the intensive care unit of the hospital.  If you have a vascular access, such as a central line, to provide short-term or long-term access to your veins.  If you have a catheter to drain urine from your bladder.  If you are on a ventilator. A ventilator  is a machine that helps you breathe by moving air in and out of your lungs.  After surgery. What are the risks? Risks of using CHG include:  A skin reaction.  Hearing loss, if CHG gets in your ears.  Eye injury, if CHG gets in your eyes and is not rinsed out.  The CHG product catching fire. Make sure that you avoid smoking and flames after applying CHG to your skin. Do not use CHG:  If you have a chlorhexidine allergy or have previously reacted to chlorhexidine.  On babies younger than 972 months of age. How to use CHG solution  Use CHG only as told by your health care provider, and follow the instructions on the label.  Use the full amount of CHG as directed. Usually, this is one bottle. During a shower Follow these steps when using CHG solution during a shower (unless your health care provider gives you different instructions): 1. Start the shower. 2. Use your normal soap and shampoo to wash your face and hair. 3. Turn off the shower or move out of the shower stream. 4. Pour the CHG onto a clean washcloth. Do not use any type of brush or rough-edged sponge. 5. Starting at your neck, lather your body down to your toes. Make sure you follow these instructions: ? If you will be having surgery, pay special attention to the part of your body where you will be having surgery. Scrub this area for at least 1 minute. ? Do not use CHG on your head or face. If the solution gets into your ears or eyes, rinse them well with water. ? Avoid your genital area. ? Avoid any areas of skin that have broken skin, cuts, or scrapes. ? Scrub your back and under your arms. Make sure to  wash skin folds. 6. Let the lather sit on your skin for 1-2 minutes or as long as told by your health care provider. 7. Thoroughly rinse your entire body in the shower. Make sure that all body creases and crevices are rinsed well. 8. Dry off with a clean towel. Do not put any substances on your body afterward-such as powder, lotion, or perfume-unless you are told to do so by your health care provider. Only use lotions that are recommended by the manufacturer. 9. Put on clean clothes or pajamas. 10. If it is the night before your surgery, sleep in clean sheets.  During a sponge bath Follow these steps when using CHG solution during a sponge bath (unless your health care provider gives you different instructions): 1. Use your normal soap and shampoo to wash your face and hair. 2. Pour the CHG onto a clean washcloth. 3. Starting at your neck, lather your body down to your toes. Make sure you follow these instructions: ? If you will be having surgery, pay special attention to the part of your body where you will be having surgery. Scrub this area for at least 1 minute. ? Do not use CHG on your head or face. If the solution gets into your ears or eyes, rinse them well with water. ? Avoid your genital area. ? Avoid any areas of skin that have broken skin, cuts, or scrapes. ? Scrub your back and under your arms. Make sure to wash skin folds. 4. Let the lather sit on your skin for 1-2 minutes or as long as told by your health care provider. 5. Using a different clean, wet washcloth, thoroughly rinse your entire body. Make sure that  all body creases and crevices are rinsed well. 6. Dry off with a clean towel. Do not put any substances on your body afterward-such as powder, lotion, or perfume-unless you are told to do so by your health care provider. Only use lotions that are recommended by the manufacturer. 7. Put on clean clothes or pajamas. 8. If it is the night before your surgery, sleep in clean  sheets. How to use CHG prepackaged cloths  Only use CHG cloths as told by your health care provider, and follow the instructions on the label.  Use the CHG cloth on clean, dry skin.  Do not use the CHG cloth on your head or face unless your health care provider tells you to.  When washing with the CHG cloth: ? Avoid your genital area. ? Avoid any areas of skin that have broken skin, cuts, or scrapes. Before surgery Follow these steps when using a CHG cloth to clean before surgery (unless your health care provider gives you different instructions): 1. Using the CHG cloth, vigorously scrub the part of your body where you will be having surgery. Scrub using a back-and-forth motion for 3 minutes. The area on your body should be completely wet with CHG when you are done scrubbing. 2. Do not rinse. Discard the cloth and let the area air-dry. Do not put any substances on the area afterward, such as powder, lotion, or perfume. 3. Put on clean clothes or pajamas. 4. If it is the night before your surgery, sleep in clean sheets.  For general bathing Follow these steps when using CHG cloths for general bathing (unless your health care provider gives you different instructions). 1. Use a separate CHG cloth for each area of your body. Make sure you wash between any folds of skin and between your fingers and toes. Wash your body in the following order, switching to a new cloth after each step: ? The front of your neck, shoulders, and chest. ? Both of your arms, under your arms, and your hands. ? Your stomach and groin area, avoiding the genitals. ? Your right leg and foot. ? Your left leg and foot. ? The back of your neck, your back, and your buttocks. 2. Do not rinse. Discard the cloth and let the area air-dry. Do not put any substances on your body afterward-such as powder, lotion, or perfume-unless you are told to do so by your health care provider. Only use lotions that are recommended by the  manufacturer. 3. Put on clean clothes or pajamas. Contact a health care provider if:  Your skin gets irritated after scrubbing.  You have questions about using your solution or cloth. Get help right away if:  Your eyes become very red or swollen.  Your eyes itch badly.  Your skin itches badly and is red or swollen.  Your hearing changes.  You have trouble seeing.  You have swelling or tingling in your mouth or throat.  You have trouble breathing.  You swallow any chlorhexidine. Summary  Chlorhexidine gluconate (CHG) is a germ-killing (antiseptic) solution that is used to clean the skin. Cleaning your skin with CHG may help to lower your risk for infection.  You may be given CHG to use for bathing. It may be in a bottle or in a prepackaged cloth to use on your skin. Carefully follow your health care provider's instructions and the instructions on the product label.  Do not use CHG if you have a chlorhexidine allergy.  Contact your health care provider  if your skin gets irritated after scrubbing. This information is not intended to replace advice given to you by your health care provider. Make sure you discuss any questions you have with your health care provider. Document Released: 07/25/2012 Document Revised: 01/17/2019 Document Reviewed: 09/28/2017 Elsevier Patient Education  2020 [employer].

## 2019-05-24 ENCOUNTER — Other Ambulatory Visit (HOSPITAL_COMMUNITY)
Admission: RE | Admit: 2019-05-24 | Discharge: 2019-05-24 | Disposition: A | Discharge status: Home / Self Care | Payer: Medicaid Other | Source: Ambulatory Visit | Attending: Obstetrics & Gynecology | Admitting: Obstetrics & Gynecology

## 2019-05-24 ENCOUNTER — Other Ambulatory Visit: Payer: Self-pay | Admitting: Obstetrics & Gynecology

## 2019-05-24 ENCOUNTER — Encounter (HOSPITAL_COMMUNITY)
Admission: RE | Admit: 2019-05-24 | Discharge: 2019-05-24 | Disposition: A | Discharge status: Home / Self Care | Payer: Medicaid Other | Source: Ambulatory Visit | Attending: Obstetrics & Gynecology | Admitting: Obstetrics & Gynecology

## 2019-05-24 ENCOUNTER — Other Ambulatory Visit: Payer: Self-pay

## 2019-05-24 ENCOUNTER — Encounter (HOSPITAL_COMMUNITY): Payer: Self-pay

## 2019-05-27 ENCOUNTER — Encounter (HOSPITAL_COMMUNITY): Payer: Self-pay

## 2019-05-27 ENCOUNTER — Encounter (HOSPITAL_COMMUNITY)
Admission: RE | Admit: 2019-05-27 | Discharge: 2019-05-27 | Disposition: A | Discharge status: Home / Self Care | Payer: Medicaid Other | Source: Ambulatory Visit | Attending: Obstetrics & Gynecology | Admitting: Obstetrics & Gynecology

## 2019-05-27 ENCOUNTER — Other Ambulatory Visit: Payer: Self-pay

## 2019-05-27 ENCOUNTER — Other Ambulatory Visit (HOSPITAL_COMMUNITY)
Admission: RE | Admit: 2019-05-27 | Discharge: 2019-05-27 | Disposition: A | Discharge status: Home / Self Care | Payer: Medicaid Other | Source: Ambulatory Visit | Attending: Obstetrics & Gynecology | Admitting: Obstetrics & Gynecology

## 2019-05-27 DIAGNOSIS — Z01812 Encounter for preprocedural laboratory examination: Secondary | ICD-10-CM | POA: Insufficient documentation

## 2019-05-27 DIAGNOSIS — Z1159 Encounter for screening for other viral diseases: Secondary | ICD-10-CM | POA: Insufficient documentation

## 2019-05-27 LAB — COMPREHENSIVE METABOLIC PANEL
ALT: 14 U/L (ref 0–44)
AST: 14 U/L — ABNORMAL LOW (ref 15–41)
Albumin: 4 g/dL (ref 3.5–5.0)
Alkaline Phosphatase: 54 U/L (ref 38–126)
Anion gap: 8 (ref 5–15)
BUN: 17 mg/dL (ref 6–20)
CO2: 24 mmol/L (ref 22–32)
Calcium: 9.7 mg/dL (ref 8.9–10.3)
Chloride: 108 mmol/L (ref 98–111)
Creatinine, Ser: 1.01 mg/dL — ABNORMAL HIGH (ref 0.44–1.00)
GFR calc Af Amer: >60 mL/min (ref >60)
GFR calc non Af Amer: >60 mL/min (ref >60)
Glucose, Bld: 112 mg/dL — ABNORMAL HIGH (ref 70–99)
Potassium: 3.8 mmol/L (ref 3.5–5.1)
Sodium: 140 mmol/L (ref 135–145)
Total Bilirubin: 0.4 mg/dL (ref 0.3–1.2)
Total Protein: 7.1 g/dL (ref 6.5–8.1)

## 2019-05-27 LAB — HCG, QUANTITATIVE, PREGNANCY: hCG, Beta Chain, Quant, S: <1 m[IU]/mL (ref <5)

## 2019-05-27 LAB — CBC
HCT: 38.3 % (ref 36.0–46.0)
Hemoglobin: 12.3 g/dL (ref 12.0–15.0)
MCH: 28.5 pg (ref 26.0–34.0)
MCHC: 32.1 g/dL (ref 30.0–36.0)
MCV: 88.9 fL (ref 80.0–100.0)
Platelets: 326 10*3/uL (ref 150–400)
RBC: 4.31 MIL/uL (ref 3.87–5.11)
RDW: 14.6 % (ref 11.5–15.5)
WBC: 5.4 10*3/uL (ref 4.0–10.5)
nRBC: 0 % (ref 0.0–0.2)

## 2019-05-27 LAB — TYPE AND SCREEN
ABO/RH(D): A POS
Antibody Screen: NEGATIVE

## 2019-05-28 LAB — SARS CORONAVIRUS 2 (TAT 6-24 HRS): SARS Coronavirus 2: NEGATIVE

## 2019-05-29 ENCOUNTER — Ambulatory Visit (HOSPITAL_COMMUNITY)
Admission: RE | Admit: 2019-05-29 | Discharge: 2019-05-29 | Disposition: A | Discharge status: Home / Self Care | Payer: Medicaid Other | Attending: Obstetrics & Gynecology | Admitting: Obstetrics & Gynecology

## 2019-05-29 ENCOUNTER — Encounter (HOSPITAL_COMMUNITY)
Admission: RE | Disposition: A | Discharge status: Home / Self Care | Payer: Self-pay | Source: Home / Self Care | Attending: Obstetrics & Gynecology

## 2019-05-29 ENCOUNTER — Encounter (HOSPITAL_COMMUNITY): Payer: Self-pay | Admitting: Anesthesiology

## 2019-05-29 ENCOUNTER — Other Ambulatory Visit: Payer: Self-pay

## 2019-05-29 ENCOUNTER — Encounter (HOSPITAL_COMMUNITY): Payer: Self-pay

## 2019-05-29 DIAGNOSIS — Z539 Procedure and treatment not carried out, unspecified reason: Secondary | ICD-10-CM | POA: Diagnosis not present

## 2019-05-29 LAB — RAPID URINE DRUG SCREEN, HOSP PERFORMED
Amphetamines: NOT DETECTED
Barbiturates: NOT DETECTED
Benzodiazepines: POSITIVE — AB
Cocaine: POSITIVE — AB
Opiates: NOT DETECTED
Tetrahydrocannabinol: POSITIVE — AB

## 2019-05-29 SURGERY — LIGATION, FALLOPIAN TUBE, LAPAROSCOPIC
Anesthesia: General | Laterality: Bilateral

## 2019-05-29 MED ORDER — BUPIVACAINE LIPOSOME 1.3 % IJ SUSP
INTRAMUSCULAR | Status: AC
  Filled 2019-05-29: qty 20

## 2019-05-29 MED ORDER — KETOROLAC TROMETHAMINE 30 MG/ML IJ SOLN: 30.0000 mg | Freq: Once | INTRAMUSCULAR | Status: DC

## 2019-05-29 MED ORDER — CEFAZOLIN SODIUM-DEXTROSE 2-4 GM/100ML-% IV SOLN: 2.0000 g | INTRAVENOUS | Status: DC

## 2019-05-29 NOTE — Progress Notes (Signed)
Based on a reported recent history of cocaine use, a urine drug test was performed today, showing a positive result for cocaine, among other substances.  Based on the non-urgent nature of this surgery and the risk associated, the decision to cancel this procedure was made collaboratively with Dr. Elonda Husky.  The patient was notified.

## 2019-06-10 ENCOUNTER — Encounter: Payer: Medicaid Other | Admitting: Obstetrics & Gynecology

## 2019-07-04 NOTE — Patient Instructions (Addendum)
Rose Holden  07/04/2019     @PREFPERIOPPHARMACY @   Your procedure is scheduled on  07/10/2019  Report to [facility] at  0820  A.M.  Call this number if you have problems the morning of surgery:  [phone]   Remember:  Do not eat or drink after midnight.                         Take these medicines the morning of surgery with A SIP OF WATER  None    Do not wear jewelry, make-up or nail polish.  Do not wear lotions, powders, or perfume. Please wear deodorant and brush your teeth.  Do not shave 48 hours prior to surgery.  Men may shave face and neck.  Do not bring valuables to the hospital.  [facility] is not responsible for any belongings or valuables.  Contacts, dentures or bridgework may not be worn into surgery.  Leave your suitcase in the car.  After surgery it may be brought to your room.  For patients admitted to the hospital, discharge time will be determined by your treatment team.  Patients discharged the day of surgery will not be allowed to drive home.   Name and phone number of your driver:   family Special instructions: None  Please read over the following fact sheets that you were given. Anesthesia Post-op Instructions and Care and Recovery After Surgery       Laparoscopic Tubal Ligation, Care After This sheet gives you information about how to care for yourself after your procedure. Your health care provider may also give you more specific instructions. If you have problems or questions, contact your health care provider. What can I expect after the procedure? After the procedure, it is common to have:  A sore throat.  Discomfort in your shoulder.  Mild discomfort or cramping in your abdomen.  Gas pains.  Pain or soreness in the area where the surgical incision was made.  A bloated feeling.  Tiredness.  Nausea.  Vomiting. Follow these instructions at home: Medicines  Take over-the-counter and prescription medicines only  as told by your health care provider.  Do not take aspirin because it can cause bleeding.  Ask your health care provider if the medicine prescribed to you: ? Requires you to avoid driving or using heavy machinery. ? Can cause constipation. You may need to take actions to prevent or treat constipation, such as:  Drink enough fluid to keep your urine pale yellow.  Take over-the-counter or prescription medicines.  Eat foods that are high in fiber, such as beans, whole grains, and fresh fruits and vegetables.  Limit foods that are high in fat and processed sugars, such as fried or sweet foods. Incision care      Follow instructions from your health care provider about how to take care of your incision. Make sure you: ? Wash your hands with soap and water before and after you change your bandage (dressing). If soap and water are not available, use hand sanitizer. ? Change your dressing as told by your health care provider. ? Leave stitches (sutures), skin glue, or adhesive strips in place. These skin closures may need to stay in place for 2 weeks or longer. If adhesive strip edges start to loosen and curl up, you may trim the loose edges. Do not remove adhesive strips completely unless your health care provider tells you to do that.  Check your incision area every day for signs of infection. Check for: ? Redness, swelling, or pain. ? Fluid or blood. ? Warmth. ? Pus or a bad smell. Activity  Rest as told by your health care provider.  Avoid sitting for a long time without moving. Get up to take short walks every 1-2 hours. This is important to improve blood flow and breathing. Ask for help if you feel weak or unsteady.  Return to your normal activities as told by your health care provider. Ask your health care provider what activities are safe for you. General instructions  Do not take baths, swim, or use a hot tub until your health care provider approves. Ask your health care  provider if you may take showers. You may only be allowed to take sponge baths.  Have someone help you with your daily household tasks for the first few days.  Keep all follow-up visits as told by your health care provider. This is important. Contact a health care provider if:  You have redness, swelling, or pain around your incision.  Your incision feels warm to the touch.  You have pus or a bad smell coming from your incision.  The edges of your incision break open after the sutures have been removed.  Your pain does not improve after 2-3 days.  You have a rash.  You repeatedly become dizzy or light-headed.  Your pain medicine is not helping. Get help right away if you:  Have a fever.  Faint.  Have increasing pain in your abdomen.  Have severe pain in one or both of your shoulders.  Have fluid or blood coming from your sutures or from your vagina.  Have shortness of breath or difficulty breathing.  Have chest pain or leg pain.  Have ongoing nausea, vomiting, or diarrhea. Summary  After the procedure, it is common to have mild discomfort or cramping in your abdomen.  Take over-the-counter and prescription medicines only as told by your health care provider.  Watch for symptoms that should prompt you to call your health care provider.  Keep all follow-up visits as told by your health care provider. This is important. This information is not intended to replace advice given to you by your health care provider. Make sure you discuss any questions you have with your health care provider. Document Released: 05/20/2005 Document Revised: 09/25/2018 Document Reviewed: 09/25/2018 Elsevier Patient Education  2020 [employer]  General Anesthesia, Adult, Care After This sheet gives you information about how to care for yourself after your procedure. Your health care provider may also give you more specific instructions. If you have problems or questions, contact your  health care provider. What can I expect after the procedure? After the procedure, the following side effects are common:  Pain or discomfort at the IV site.  Nausea.  Vomiting.  Sore throat.  Trouble concentrating.  Feeling cold or chills.  Weak or tired.  Sleepiness and fatigue.  Soreness and body aches. These side effects can affect parts of the body that were not involved in surgery. Follow these instructions at home:  For at least 24 hours after the procedure:  Have a responsible adult stay with you. It is important to have someone help care for you until you are awake and alert.  Rest as needed.  Do not: ? Participate in activities in which you could fall or become injured. ? Drive. ? Use heavy machinery. ? Drink alcohol. ? Take sleeping pills or medicines that cause  drowsiness. ? Make important decisions or sign legal documents. ? Take care of children on your own. Eating and drinking  Follow any instructions from your health care provider about eating or drinking restrictions.  When you feel hungry, start by eating small amounts of foods that are soft and easy to digest (bland), such as toast. Gradually return to your regular diet.  Drink enough fluid to keep your urine pale yellow.  If you vomit, rehydrate by drinking water, juice, or clear broth. General instructions  If you have sleep apnea, surgery and certain medicines can increase your risk for breathing problems. Follow instructions from your health care provider about wearing your sleep device: ? Anytime you are sleeping, including during daytime naps. ? While taking prescription pain medicines, sleeping medicines, or medicines that make you drowsy.  Return to your normal activities as told by your health care provider. Ask your health care provider what activities are safe for you.  Take over-the-counter and prescription medicines only as told by your health care provider.  If you smoke, do not  smoke without supervision.  Keep all follow-up visits as told by your health care provider. This is important. Contact a health care provider if:  You have nausea or vomiting that does not get better with medicine.  You cannot eat or drink without vomiting.  You have pain that does not get better with medicine.  You are unable to pass urine.  You develop a skin rash.  You have a fever.  You have redness around your IV site that gets worse. Get help right away if:  You have difficulty breathing.  You have chest pain.  You have blood in your urine or stool, or you vomit blood. Summary  After the procedure, it is common to have a sore throat or nausea. It is also common to feel tired.  Have a responsible adult stay with you for the first 24 hours after general anesthesia. It is important to have someone help care for you until you are awake and alert.  When you feel hungry, start by eating small amounts of foods that are soft and easy to digest (bland), such as toast. Gradually return to your regular diet.  Drink enough fluid to keep your urine pale yellow.  Return to your normal activities as told by your health care provider. Ask your health care provider what activities are safe for you. This information is not intended to replace advice given to you by your health care provider. Make sure you discuss any questions you have with your health care provider. Document Released: 02/06/2001 Document Revised: 11/03/2017 Document Reviewed: 06/16/2017 Elsevier Patient Education  2020 [employer] How to Use Chlorhexidine for Bathing Chlorhexidine gluconate (CHG) is a germ-killing (antiseptic) solution that is used to clean the skin. It can get rid of the bacteria that normally live on the skin and can keep them away for about 24 hours. To clean your skin with CHG, you may be given:  A CHG solution to use in the shower or as part of a sponge bath.  A prepackaged cloth that  contains CHG. Cleaning your skin with CHG may help lower the risk for infection:  While you are staying in the intensive care unit of the hospital.  If you have a vascular access, such as a central line, to provide short-term or long-term access to your veins.  If you have a catheter to drain urine from your bladder.  If you are on a  ventilator. A ventilator is a machine that helps you breathe by moving air in and out of your lungs.  After surgery. What are the risks? Risks of using CHG include:  A skin reaction.  Hearing loss, if CHG gets in your ears.  Eye injury, if CHG gets in your eyes and is not rinsed out.  The CHG product catching fire. Make sure that you avoid smoking and flames after applying CHG to your skin. Do not use CHG:  If you have a chlorhexidine allergy or have previously reacted to chlorhexidine.  On babies younger than 572 months of age. How to use CHG solution  Use CHG only as told by your health care provider, and follow the instructions on the label.  Use the full amount of CHG as directed. Usually, this is one bottle. During a shower Follow these steps when using CHG solution during a shower (unless your health care provider gives you different instructions): 1. Start the shower. 2. Use your normal soap and shampoo to wash your face and hair. 3. Turn off the shower or move out of the shower stream. 4. Pour the CHG onto a clean washcloth. Do not use any type of brush or rough-edged sponge. 5. Starting at your neck, lather your body down to your toes. Make sure you follow these instructions: ? If you will be having surgery, pay special attention to the part of your body where you will be having surgery. Scrub this area for at least 1 minute. ? Do not use CHG on your head or face. If the solution gets into your ears or eyes, rinse them well with water. ? Avoid your genital area. ? Avoid any areas of skin that have broken skin, cuts, or scrapes. ? Scrub  your back and under your arms. Make sure to wash skin folds. 6. Let the lather sit on your skin for 1-2 minutes or as long as told by your health care provider. 7. Thoroughly rinse your entire body in the shower. Make sure that all body creases and crevices are rinsed well. 8. Dry off with a clean towel. Do not put any substances on your body afterward-such as powder, lotion, or perfume-unless you are told to do so by your health care provider. Only use lotions that are recommended by the manufacturer. 9. Put on clean clothes or pajamas. 10. If it is the night before your surgery, sleep in clean sheets.  During a sponge bath Follow these steps when using CHG solution during a sponge bath (unless your health care provider gives you different instructions): 1. Use your normal soap and shampoo to wash your face and hair. 2. Pour the CHG onto a clean washcloth. 3. Starting at your neck, lather your body down to your toes. Make sure you follow these instructions: ? If you will be having surgery, pay special attention to the part of your body where you will be having surgery. Scrub this area for at least 1 minute. ? Do not use CHG on your head or face. If the solution gets into your ears or eyes, rinse them well with water. ? Avoid your genital area. ? Avoid any areas of skin that have broken skin, cuts, or scrapes. ? Scrub your back and under your arms. Make sure to wash skin folds. 4. Let the lather sit on your skin for 1-2 minutes or as long as told by your health care provider. 5. Using a different clean, wet washcloth, thoroughly rinse your entire body.  Make sure that all body creases and crevices are rinsed well. 6. Dry off with a clean towel. Do not put any substances on your body afterward-such as powder, lotion, or perfume-unless you are told to do so by your health care provider. Only use lotions that are recommended by the manufacturer. 7. Put on clean clothes or pajamas. 8. If it is the  night before your surgery, sleep in clean sheets. How to use CHG prepackaged cloths  Only use CHG cloths as told by your health care provider, and follow the instructions on the label.  Use the CHG cloth on clean, dry skin.  Do not use the CHG cloth on your head or face unless your health care provider tells you to.  When washing with the CHG cloth: ? Avoid your genital area. ? Avoid any areas of skin that have broken skin, cuts, or scrapes. Before surgery Follow these steps when using a CHG cloth to clean before surgery (unless your health care provider gives you different instructions): 1. Using the CHG cloth, vigorously scrub the part of your body where you will be having surgery. Scrub using a back-and-forth motion for 3 minutes. The area on your body should be completely wet with CHG when you are done scrubbing. 2. Do not rinse. Discard the cloth and let the area air-dry. Do not put any substances on the area afterward, such as powder, lotion, or perfume. 3. Put on clean clothes or pajamas. 4. If it is the night before your surgery, sleep in clean sheets.  For general bathing Follow these steps when using CHG cloths for general bathing (unless your health care provider gives you different instructions). 1. Use a separate CHG cloth for each area of your body. Make sure you wash between any folds of skin and between your fingers and toes. Wash your body in the following order, switching to a new cloth after each step: ? The front of your neck, shoulders, and chest. ? Both of your arms, under your arms, and your hands. ? Your stomach and groin area, avoiding the genitals. ? Your right leg and foot. ? Your left leg and foot. ? The back of your neck, your back, and your buttocks. 2. Do not rinse. Discard the cloth and let the area air-dry. Do not put any substances on your body afterward-such as powder, lotion, or perfume-unless you are told to do so by your health care provider. Only use  lotions that are recommended by the manufacturer. 3. Put on clean clothes or pajamas. Contact a health care provider if:  Your skin gets irritated after scrubbing.  You have questions about using your solution or cloth. Get help right away if:  Your eyes become very red or swollen.  Your eyes itch badly.  Your skin itches badly and is red or swollen.  Your hearing changes.  You have trouble seeing.  You have swelling or tingling in your mouth or throat.  You have trouble breathing.  You swallow any chlorhexidine. Summary  Chlorhexidine gluconate (CHG) is a germ-killing (antiseptic) solution that is used to clean the skin. Cleaning your skin with CHG may help to lower your risk for infection.  You may be given CHG to use for bathing. It may be in a bottle or in a prepackaged cloth to use on your skin. Carefully follow your health care provider's instructions and the instructions on the product label.  Do not use CHG if you have a chlorhexidine allergy.  Contact your  health care provider if your skin gets irritated after scrubbing. This information is not intended to replace advice given to you by your health care provider. Make sure you discuss any questions you have with your health care provider. Document Released: 07/25/2012 Document Revised: 01/17/2019 Document Reviewed: 09/28/2017 Elsevier Patient Education  2020 [employer].

## 2019-07-05 ENCOUNTER — Other Ambulatory Visit: Payer: Self-pay | Admitting: Obstetrics & Gynecology

## 2019-07-08 ENCOUNTER — Other Ambulatory Visit (HOSPITAL_COMMUNITY)
Admission: RE | Admit: 2019-07-08 | Discharge: 2019-07-08 | Disposition: A | Discharge status: Home / Self Care | Payer: Medicaid Other | Source: Ambulatory Visit | Attending: Obstetrics & Gynecology | Admitting: Obstetrics & Gynecology

## 2019-07-08 ENCOUNTER — Encounter (HOSPITAL_COMMUNITY): Payer: Self-pay

## 2019-07-08 ENCOUNTER — Other Ambulatory Visit: Payer: Self-pay

## 2019-07-08 ENCOUNTER — Encounter (HOSPITAL_COMMUNITY)
Admission: RE | Admit: 2019-07-08 | Discharge: 2019-07-08 | Disposition: A | Discharge status: Home / Self Care | Payer: Medicaid Other | Source: Ambulatory Visit | Attending: Obstetrics & Gynecology | Admitting: Obstetrics & Gynecology

## 2019-07-08 DIAGNOSIS — Z20828 Contact with and (suspected) exposure to other viral communicable diseases: Secondary | ICD-10-CM | POA: Diagnosis not present

## 2019-07-08 DIAGNOSIS — Z01812 Encounter for preprocedural laboratory examination: Secondary | ICD-10-CM | POA: Diagnosis not present

## 2019-07-08 LAB — COMPREHENSIVE METABOLIC PANEL
ALT: 15 U/L (ref 0–44)
AST: 18 U/L (ref 15–41)
Albumin: 3.7 g/dL (ref 3.5–5.0)
Alkaline Phosphatase: 53 U/L (ref 38–126)
Anion gap: 8 (ref 5–15)
BUN: 15 mg/dL (ref 6–20)
CO2: 24 mmol/L (ref 22–32)
Calcium: 9.3 mg/dL (ref 8.9–10.3)
Chloride: 108 mmol/L (ref 98–111)
Creatinine, Ser: 0.95 mg/dL (ref 0.44–1.00)
GFR calc Af Amer: >60 mL/min (ref >60)
GFR calc non Af Amer: >60 mL/min (ref >60)
Glucose, Bld: 102 mg/dL — ABNORMAL HIGH (ref 70–99)
Potassium: 3.4 mmol/L — ABNORMAL LOW (ref 3.5–5.1)
Sodium: 140 mmol/L (ref 135–145)
Total Bilirubin: 0.7 mg/dL (ref 0.3–1.2)
Total Protein: 6.9 g/dL (ref 6.5–8.1)

## 2019-07-08 LAB — CBC
HCT: 38.6 % (ref 36.0–46.0)
Hemoglobin: 12.3 g/dL (ref 12.0–15.0)
MCH: 28.9 pg (ref 26.0–34.0)
MCHC: 31.9 g/dL (ref 30.0–36.0)
MCV: 90.8 fL (ref 80.0–100.0)
Platelets: 371 10*3/uL (ref 150–400)
RBC: 4.25 MIL/uL (ref 3.87–5.11)
RDW: 13.5 % (ref 11.5–15.5)
WBC: 5 10*3/uL (ref 4.0–10.5)
nRBC: 0 % (ref 0.0–0.2)

## 2019-07-08 LAB — RAPID HIV SCREEN (HIV 1/2 AB+AG)
HIV 1/2 Antibodies: NONREACTIVE
HIV-1 P24 Antigen - HIV24: NONREACTIVE

## 2019-07-08 LAB — SARS CORONAVIRUS 2 (TAT 6-24 HRS): SARS Coronavirus 2: NEGATIVE

## 2019-07-08 LAB — HCG, QUANTITATIVE, PREGNANCY: hCG, Beta Chain, Quant, S: 1 m[IU]/mL (ref <5)

## 2019-07-08 NOTE — Progress Notes (Signed)
Pt refuses to submit a urine sample. States that she has just used the restroom and has somewhere to be at 1Pm. "I don't want to do that pee test right now. I want to do it when I come back." Pt says that she will call Dr. Elonda Husky herself to let him know that she is not doing the urine test today.

## 2019-07-10 ENCOUNTER — Other Ambulatory Visit: Payer: Self-pay

## 2019-07-10 ENCOUNTER — Ambulatory Visit (HOSPITAL_COMMUNITY): Payer: Medicaid Other | Admitting: Anesthesiology

## 2019-07-10 ENCOUNTER — Encounter (HOSPITAL_COMMUNITY)
Admission: RE | Disposition: A | Discharge status: Home / Self Care | Payer: Self-pay | Source: Home / Self Care | Attending: Obstetrics & Gynecology

## 2019-07-10 ENCOUNTER — Ambulatory Visit (HOSPITAL_COMMUNITY)
Admission: RE | Admit: 2019-07-10 | Discharge: 2019-07-10 | Disposition: A | Discharge status: Home / Self Care | Payer: Medicaid Other | Attending: Obstetrics & Gynecology | Admitting: Obstetrics & Gynecology

## 2019-07-10 DIAGNOSIS — G709 Myoneural disorder, unspecified: Secondary | ICD-10-CM | POA: Insufficient documentation

## 2019-07-10 DIAGNOSIS — Z6839 Body mass index (BMI) 39.0-39.9, adult: Secondary | ICD-10-CM | POA: Diagnosis not present

## 2019-07-10 DIAGNOSIS — F17210 Nicotine dependence, cigarettes, uncomplicated: Secondary | ICD-10-CM | POA: Insufficient documentation

## 2019-07-10 DIAGNOSIS — Z302 Encounter for sterilization: Secondary | ICD-10-CM | POA: Insufficient documentation

## 2019-07-10 HISTORY — PX: LAPAROSCOPIC TUBAL LIGATION: SHX1937

## 2019-07-10 LAB — RAPID URINE DRUG SCREEN, HOSP PERFORMED
Amphetamines: NOT DETECTED
Barbiturates: NOT DETECTED
Benzodiazepines: NOT DETECTED
Cocaine: NOT DETECTED
Opiates: NOT DETECTED
Tetrahydrocannabinol: NOT DETECTED

## 2019-07-10 SURGERY — LIGATION, FALLOPIAN TUBE, LAPAROSCOPIC
Anesthesia: General | Laterality: Bilateral

## 2019-07-10 MED ORDER — ONDANSETRON 8 MG PO TBDP: 8.0000 mg | ORAL_TABLET | Freq: Three times a day (TID) | ORAL | 0 refills | Status: DC | PRN

## 2019-07-10 MED ORDER — HYDROMORPHONE HCL 1 MG/ML IJ SOLN: 0.5000 mg | INTRAMUSCULAR | Status: DC | PRN

## 2019-07-10 MED ORDER — PROPOFOL 10 MG/ML IV BOLUS
INTRAVENOUS | Status: AC
  Filled 2019-07-10: qty 20

## 2019-07-10 MED ORDER — FENTANYL CITRATE (PF) 100 MCG/2ML IJ SOLN
INTRAMUSCULAR | Status: DC | PRN
  Administered 2019-07-10 (×3): 50 ug via INTRAVENOUS

## 2019-07-10 MED ORDER — KETOROLAC TROMETHAMINE 30 MG/ML IJ SOLN
30.0000 mg | Freq: Once | INTRAMUSCULAR | Status: AC
  Administered 2019-07-10: 30 mg via INTRAVENOUS
  Filled 2019-07-10: qty 1

## 2019-07-10 MED ORDER — KETOROLAC TROMETHAMINE 10 MG PO TABS: 10.0000 mg | ORAL_TABLET | Freq: Three times a day (TID) | ORAL | 0 refills | Status: DC | PRN

## 2019-07-10 MED ORDER — METOCLOPRAMIDE HCL 5 MG/ML IJ SOLN
INTRAMUSCULAR | Status: AC
  Filled 2019-07-10: qty 2

## 2019-07-10 MED ORDER — GLYCOPYRROLATE 0.2 MG/ML IJ SOLN
INTRAMUSCULAR | Status: DC | PRN
  Administered 2019-07-10: 0.2 mg via INTRAVENOUS

## 2019-07-10 MED ORDER — LIDOCAINE HCL 1 % IJ SOLN
INTRAMUSCULAR | Status: DC | PRN
  Administered 2019-07-10: 60 mg via INTRADERMAL

## 2019-07-10 MED ORDER — LACTATED RINGERS IV SOLN
INTRAVENOUS | Status: DC | PRN
  Administered 2019-07-10: 09:00:00 via INTRAVENOUS

## 2019-07-10 MED ORDER — DEXAMETHASONE SODIUM PHOSPHATE 10 MG/ML IJ SOLN
INTRAMUSCULAR | Status: DC | PRN
  Administered 2019-07-10: 10 mg via INTRAVENOUS

## 2019-07-10 MED ORDER — SODIUM CHLORIDE 0.9 % IR SOLN
Status: DC | PRN
  Administered 2019-07-10: 1000 mL

## 2019-07-10 MED ORDER — LACTATED RINGERS IV SOLN
Freq: Once | INTRAVENOUS | Status: AC
  Administered 2019-07-10: 09:00:00 via INTRAVENOUS

## 2019-07-10 MED ORDER — SUCCINYLCHOLINE CHLORIDE 20 MG/ML IJ SOLN
INTRAMUSCULAR | Status: DC | PRN
  Administered 2019-07-10: 140 mg via INTRAVENOUS

## 2019-07-10 MED ORDER — HYDROCODONE-ACETAMINOPHEN 5-325 MG PO TABS: 1.0000 | ORAL_TABLET | Freq: Four times a day (QID) | ORAL | 0 refills | Status: DC | PRN

## 2019-07-10 MED ORDER — ROCURONIUM BROMIDE 10 MG/ML (PF) SYRINGE
PREFILLED_SYRINGE | INTRAVENOUS | Status: AC
  Filled 2019-07-10: qty 10

## 2019-07-10 MED ORDER — ROCURONIUM BROMIDE 100 MG/10ML IV SOLN
INTRAVENOUS | Status: DC | PRN
  Administered 2019-07-10: 30 mg via INTRAVENOUS

## 2019-07-10 MED ORDER — MIDAZOLAM HCL 2 MG/2ML IJ SOLN
2.0000 mg | Freq: Once | INTRAMUSCULAR | Status: AC
  Administered 2019-07-10: 2 mg via INTRAVENOUS
  Filled 2019-07-10: qty 2

## 2019-07-10 MED ORDER — METOCLOPRAMIDE HCL 5 MG/ML IJ SOLN
INTRAMUSCULAR | Status: DC | PRN
  Administered 2019-07-10: 10 mg via INTRAVENOUS

## 2019-07-10 MED ORDER — BUPIVACAINE LIPOSOME 1.3 % IJ SUSP
INTRAMUSCULAR | Status: AC
  Filled 2019-07-10: qty 20

## 2019-07-10 MED ORDER — PROPOFOL 10 MG/ML IV BOLUS
INTRAVENOUS | Status: DC | PRN
  Administered 2019-07-10: 200 mg via INTRAVENOUS

## 2019-07-10 MED ORDER — GLYCOPYRROLATE PF 0.2 MG/ML IJ SOSY
PREFILLED_SYRINGE | INTRAMUSCULAR | Status: AC
  Filled 2019-07-10: qty 1

## 2019-07-10 MED ORDER — BUPIVACAINE LIPOSOME 1.3 % IJ SUSP
INTRAMUSCULAR | Status: DC | PRN
  Administered 2019-07-10: 20 mL

## 2019-07-10 MED ORDER — LIDOCAINE 2% (20 MG/ML) 5 ML SYRINGE
INTRAMUSCULAR | Status: AC
  Filled 2019-07-10: qty 5

## 2019-07-10 MED ORDER — MEPERIDINE HCL 50 MG/ML IJ SOLN: 6.2500 mg | INTRAMUSCULAR | Status: DC | PRN

## 2019-07-10 MED ORDER — PROMETHAZINE HCL 25 MG/ML IJ SOLN: 6.2500 mg | INTRAMUSCULAR | Status: DC | PRN

## 2019-07-10 MED ORDER — MIDAZOLAM HCL 5 MG/5ML IJ SOLN
INTRAMUSCULAR | Status: DC | PRN
  Administered 2019-07-10: 2 mg via INTRAVENOUS

## 2019-07-10 MED ORDER — SUGAMMADEX SODIUM 200 MG/2ML IV SOLN
INTRAVENOUS | Status: DC | PRN
  Administered 2019-07-10 (×3): 100 mg via INTRAVENOUS

## 2019-07-10 MED ORDER — DEXAMETHASONE SODIUM PHOSPHATE 10 MG/ML IJ SOLN
INTRAMUSCULAR | Status: AC
  Filled 2019-07-10: qty 1

## 2019-07-10 MED ORDER — FENTANYL CITRATE (PF) 100 MCG/2ML IJ SOLN
INTRAMUSCULAR | Status: AC
  Filled 2019-07-10: qty 2

## 2019-07-10 MED ORDER — ONDANSETRON HCL 4 MG/2ML IJ SOLN
INTRAMUSCULAR | Status: DC | PRN
  Administered 2019-07-10: 4 mg via INTRAVENOUS

## 2019-07-10 MED ORDER — MIDAZOLAM HCL 2 MG/2ML IJ SOLN
INTRAMUSCULAR | Status: AC
  Filled 2019-07-10: qty 2

## 2019-07-10 MED ORDER — ONDANSETRON HCL 4 MG/2ML IJ SOLN
INTRAMUSCULAR | Status: AC
  Filled 2019-07-10: qty 2

## 2019-07-10 MED ORDER — CEFAZOLIN SODIUM-DEXTROSE 2-4 GM/100ML-% IV SOLN
2.0000 g | INTRAVENOUS | Status: AC
  Administered 2019-07-10: 10:00:00 2 g via INTRAVENOUS
  Filled 2019-07-10: qty 100

## 2019-07-10 SURGICAL SUPPLY — 34 items
BLADE SURG SZ11 CARB STEEL (BLADE) ×3 IMPLANT
CLOTH BEACON ORANGE TIMEOUT ST (SAFETY) ×3 IMPLANT
COVER LIGHT HANDLE STERIS (MISCELLANEOUS) ×6 IMPLANT
COVER WAND RF STERILE (DRAPES) ×3 IMPLANT
DERMABOND ADVANCED (GAUZE/BANDAGES/DRESSINGS) ×2
DERMABOND ADVANCED .7 DNX12 (GAUZE/BANDAGES/DRESSINGS) ×1 IMPLANT
ELECT REM PT RETURN 9FT ADLT (ELECTROSURGICAL) ×3
ELECTRODE REM PT RTRN 9FT ADLT (ELECTROSURGICAL) ×1 IMPLANT
GAUZE 4X4 16PLY RFD (DISPOSABLE) ×3 IMPLANT
GLOVE BIOGEL PI IND STRL 7.0 (GLOVE) ×3 IMPLANT
GLOVE BIOGEL PI IND STRL 8 (GLOVE) ×1 IMPLANT
GLOVE BIOGEL PI INDICATOR 7.0 (GLOVE) ×6
GLOVE BIOGEL PI INDICATOR 8 (GLOVE) ×2
GLOVE ECLIPSE 8.0 STRL XLNG CF (GLOVE) ×3 IMPLANT
GOWN STRL REUS W/TWL LRG LVL3 (GOWN DISPOSABLE) ×3 IMPLANT
GOWN STRL REUS W/TWL XL LVL3 (GOWN DISPOSABLE) ×3 IMPLANT
KIT TURNOVER CYSTO (KITS) ×3 IMPLANT
MANIFOLD NEPTUNE II (INSTRUMENTS) ×3 IMPLANT
NEEDLE HYPO 22GX1.5 SAFETY (NEEDLE) ×3 IMPLANT
NEEDLE INSUFFLATION 14GA 120MM (NEEDLE) ×3 IMPLANT
PACK PERI GYN (CUSTOM PROCEDURE TRAY) ×3 IMPLANT
PAD ARMBOARD 7.5X6 YLW CONV (MISCELLANEOUS) ×3 IMPLANT
SET BASIN LINEN APH (SET/KITS/TRAYS/PACK) ×3 IMPLANT
SOLUTION ANTI FOG 6CC (MISCELLANEOUS) ×3 IMPLANT
SPONGE GAUZE 2X2 8PLY STER LF (GAUZE/BANDAGES/DRESSINGS) ×2
SPONGE GAUZE 2X2 8PLY STRL LF (GAUZE/BANDAGES/DRESSINGS) ×4 IMPLANT
SUT VICRYL 0 UR6 27IN ABS (SUTURE) ×3 IMPLANT
SUT VICRYL AB 3-0 FS1 BRD 27IN (SUTURE) ×3 IMPLANT
SYR 10ML LL (SYRINGE) ×9 IMPLANT
SYR 20ML LL LF (SYRINGE) ×6 IMPLANT
TROCAR ENDO BLADELESS 11MM (ENDOMECHANICALS) ×3 IMPLANT
TROCAR XCEL NON-BLD 5MMX100MML (ENDOMECHANICALS) ×3 IMPLANT
TUBING INSUF HEATED (TUBING) ×3 IMPLANT
WARMER LAPAROSCOPE (MISCELLANEOUS) ×3 IMPLANT

## 2019-07-10 NOTE — Anesthesia Procedure Notes (Signed)
Procedure Name: Intubation Date/Time: 07/10/2019 10:06 AM Performed by: Charmaine Downs, CRNA Pre-anesthesia Checklist: Patient identified, Patient being monitored, Emergency Drugs available and Suction available Patient Re-evaluated:Patient Re-evaluated prior to induction Oxygen Delivery Method: Circle System Utilized Preoxygenation: Pre-oxygenation with 100% oxygen Induction Type: IV induction and Rapid sequence Ventilation: Mask ventilation without difficulty Laryngoscope Size: Mac and 3 Grade View: Grade I Tube type: Oral Tube size: 7.0 mm Number of attempts: 2 Airway Equipment and Method: stylet Placement Confirmation: ETT inserted through vocal cords under direct vision,  positive ETCO2 and breath sounds checked- equal and bilateral Secured at: 22 cm Tube secured with: Tape Dental Injury: Teeth and Oropharynx as per pre-operative assessment

## 2019-07-10 NOTE — Op Note (Signed)
Preoperative Diagnosis:  Multiparous female desires permanent sterilization  Postoperative Diagnosis:  Same as above  Procedure:  Laparoscopic Bilateral Tubal Ligation using electrocautery  Surgeon:  Jacelyn Grip MD  Anaesthesia:  LMA  Findings:  Patient had normal pelvic anatomy and no intraperitoneal abnormalities.  Description of Operation:  Patient was taken to the OR and placed into supine position where she underwent LMA anaesthesia.  She was placed in the dorsal lithotomy position and prepped and draped in the usual sterile fashion.  An incision was made in the umbilicus and dissection taken down to the rectus fascia.  The rectus fascia was grasped with a coker clamp and a Veres needle was placed into the peritoneal cavity with 1 pass without difficulty.  The perioneal cavity was insufflated.    The non bladed trocar was then placed.  The above noted findings were observed.  The [facility] Bipolar cautery electrocautery unit was employed and both tubes were burned to no resistance and beyond in the distal ishtmic and ampullary regions, approximately a 3.5 cm segment bilaterally.  There was good hemostasis.  The fascia, peritoneum and subcutaneous tissue were closed using 0 vicryl.  The skin was closed using staples.  The patient was awakened from anaesthesia and taken to the PACU with all counts being correct x 3.  The patient received Ancef 2 gram and Toradol 30 mg IV preoperatively.   Blood loss was 0 cc.  Florian Buff, MD 07/10/2019 10:55 AM

## 2019-07-10 NOTE — Transfer of Care (Signed)
Immediate Anesthesia Transfer of Care Note  Patient: Rose Holden  Procedure(s) Performed: LAPAROSCOPIC BILATERAL TUBAL LIGATION USING ELECTROCAUTERY (Bilateral )  Patient Location: PACU  Anesthesia Type:General  Level of Consciousness: drowsy and patient cooperative  Airway & Oxygen Therapy: Patient Spontanous Breathing and Patient connected to face mask oxygen  Post-op Assessment: Report given to RN, Post -op Vital signs reviewed and stable and Patient moving all extremities  Post vital signs: Reviewed and stable  Last Vitals:  Vitals Value Taken Time  BP    Temp    Pulse    Resp    SpO2      Last Pain: There were no vitals filed for this visit.       Complications: No apparent anesthesia complications

## 2019-07-10 NOTE — H&P (Signed)
Preoperative History and Physical  Rose Holden is a 28 y.o. [id number] with No LMP recorded. admitted for a lapaROSCOPIC BILATERAL TUBAL LIGATION USING ELECTROCAUTERY.  Pt was cancelled x 1 due to positive cocaine, today's test was negative  PMH:    Past Medical History:  Diagnosis Date  . Bell's palsy   . Chlamydia   . Depression   . Gonorrhea 06/27/2018   Treated POC__________  . Gonorrhea   . Nausea & vomiting 02/17/2016  . Pollen allergies 02/17/2016  . Pregnant 02/17/2016  . Vaginal discharge 02/17/2016    PSH:     Past Surgical History:  Procedure Laterality Date  . broken leg     closed reduction    POb/GynH:      OB History    Gravida  7   Para  5   Term  5   Preterm      AB  2   Living  5     SAB      TAB  1   Ectopic      Multiple  0   Live Births  5           SH:   Social History   Tobacco Use  . Smoking status: Current Every Day Smoker    Packs/day: 0.50    Years: 11.00    Pack years: 5.50    Types: Cigarettes  . Smokeless tobacco: Never Used  . Tobacco comment: smokes 10 cig daily  Substance Use Topics  . Alcohol use: Yes    Comment: occasionally  . Drug use: Not Currently    Types: Marijuana, Cocaine    Comment: last used around 05/15/19    FH:    Family History  Problem Relation Age of Onset  . Hypertension Mother   . Glaucoma Mother   . Cancer Maternal Grandmother      Allergies:  Allergies  Allergen Reactions  . Codeine Itching    Medications:       Current Facility-Administered Medications:  .  ceFAZolin (ANCEF) IVPB 2g/100 mL premix, 2 g, Intravenous, On Call to OR, Lazaro Arms, MD  Review of Systems:   Review of Systems  Constitutional: Negative for fever, chills, weight loss, malaise/fatigue and diaphoresis.  HENT: Negative for hearing loss, ear pain, nosebleeds, congestion, sore throat, neck pain, tinnitus and ear discharge.   Eyes: Negative for blurred vision, double vision, photophobia, pain,  discharge and redness.  Respiratory: Negative for cough, hemoptysis, sputum production, shortness of breath, wheezing and stridor.   Cardiovascular: Negative for chest pain, palpitations, orthopnea, claudication, leg swelling and PND.  Gastrointestinal: Positive for abdominal pain. Negative for heartburn, nausea, vomiting, diarrhea, constipation, blood in stool and melena.  Genitourinary: Negative for dysuria, urgency, frequency, hematuria and flank pain.  Musculoskeletal: Negative for myalgias, back pain, joint pain and falls.  Skin: Negative for itching and rash.  Neurological: Negative for dizziness, tingling, tremors, sensory change, speech change, focal weakness, seizures, loss of consciousness, weakness and headaches.  Endo/Heme/Allergies: Negative for environmental allergies and polydipsia. Does not bruise/bleed easily.  Psychiatric/Behavioral: Negative for depression, suicidal ideas, hallucinations, memory loss and substance abuse. The patient is not nervous/anxious and does not have insomnia.      PHYSICAL EXAM:  Blood pressure (!) 155/110, resp. rate 18, SpO2 100 %, not currently breastfeeding.    Vitals reviewed. Constitutional: She is oriented to person, place, and time. She appears well-developed and well-nourished.  HENT:  Head: Normocephalic and atraumatic.  Right  Ear: External ear normal.  Left Ear: External ear normal.  Nose: Nose normal.  Mouth/Throat: Oropharynx is clear and moist.  Eyes: Conjunctivae and EOM are normal. Pupils are equal, round, and reactive to light. Right eye exhibits no discharge. Left eye exhibits no discharge. No scleral icterus.  Neck: Normal range of motion. Neck supple. No tracheal deviation present. No thyromegaly present.  Cardiovascular: Normal rate, regular rhythm, normal heart sounds and intact distal pulses.  Exam reveals no gallop and no friction rub.   No murmur heard. Respiratory: Effort normal and breath sounds normal. No respiratory  distress. She has no wheezes. She has no rales. She exhibits no tenderness.  GI: Soft. Bowel sounds are normal. She exhibits no distension and no mass. There is tenderness. There is no rebound and no guarding.  Genitourinary:       Vulva is normal without lesions Vagina is pink moist without discharge Cervix normal in appearance and pap is normal Uterus is normal size, contour, position, consistency, mobility, non-tender Adnexa is negative with normal sized ovaries by sonogram  Musculoskeletal: Normal range of motion. She exhibits no edema and no tenderness.  Neurological: She is alert and oriented to person, place, and time. She has normal reflexes. She displays normal reflexes. No cranial nerve deficit. She exhibits normal muscle tone. Coordination normal.  Skin: Skin is warm and dry. No rash noted. No erythema. No pallor.  Psychiatric: She has a normal mood and affect. Her behavior is normal. Judgment and thought content normal.    Labs: Results for orders placed or performed during the hospital encounter of 07/10/19 (from the past 336 hour(s))  Rapid urine drug screen (hospital performed)   Collection Time: 07/10/19  8:31 AM  Result Value Ref Range   Opiates NONE DETECTED NONE DETECTED   Cocaine NONE DETECTED NONE DETECTED   Benzodiazepines NONE DETECTED NONE DETECTED   Amphetamines NONE DETECTED NONE DETECTED   Tetrahydrocannabinol NONE DETECTED NONE DETECTED   Barbiturates NONE DETECTED NONE DETECTED  Results for orders placed or performed during the hospital encounter of 07/08/19 (from the past 336 hour(s))  CBC   Collection Time: 07/08/19 11:07 AM  Result Value Ref Range   WBC 5.0 4.0 - 10.5 K/uL   RBC 4.25 3.87 - 5.11 MIL/uL   Hemoglobin 12.3 12.0 - 15.0 g/dL   HCT 38.6 36.0 - 46.0 %   MCV 90.8 80.0 - 100.0 fL   MCH 28.9 26.0 - 34.0 pg   MCHC 31.9 30.0 - 36.0 g/dL   RDW 13.5 11.5 - 15.5 %   Platelets 371 150 - 400 K/uL   nRBC 0.0 0.0 - 0.2 %  Comprehensive metabolic  panel   Collection Time: 07/08/19 11:07 AM  Result Value Ref Range   Sodium 140 135 - 145 mmol/L   Potassium 3.4 (L) 3.5 - 5.1 mmol/L   Chloride 108 98 - 111 mmol/L   CO2 24 22 - 32 mmol/L   Glucose, Bld 102 (H) 70 - 99 mg/dL   BUN 15 6 - 20 mg/dL   Creatinine, Ser 0.95 0.44 - 1.00 mg/dL   Calcium 9.3 8.9 - 10.3 mg/dL   Total Protein 6.9 6.5 - 8.1 g/dL   Albumin 3.7 3.5 - 5.0 g/dL   AST 18 15 - 41 U/L   ALT 15 0 - 44 U/L   Alkaline Phosphatase 53 38 - 126 U/L   Total Bilirubin 0.7 0.3 - 1.2 mg/dL   GFR calc non Af Amer >60 >60 mL/min  GFR calc Af Amer >60 >60 mL/min   Anion gap 8 5 - 15  hCG, quantitative, pregnancy   Collection Time: 07/08/19 11:07 AM  Result Value Ref Range   hCG, Beta Chain, Quant, S 1 <5 mIU/mL  Rapid HIV screen (HIV 1/2 Ab+Ag)   Collection Time: 07/08/19 11:07 AM  Result Value Ref Range   HIV-1 P24 Antigen - HIV24 NON REACTIVE NON REACTIVE   HIV 1/2 Antibodies NON REACTIVE NON REACTIVE   Interpretation (HIV Ag Ab)      A non reactive test result means that HIV 1 or HIV 2 antibodies and HIV 1 p24 antigen were not detected in the specimen.  Results for orders placed or performed during the hospital encounter of 07/08/19 (from the past 336 hour(s))  SARS CORONAVIRUS 2 (TAT 6-12 HRS) Nasal Swab Aptima Multi Swab   Collection Time: 07/08/19  7:01 AM   Specimen: Aptima Multi Swab; Nasal Swab  Result Value Ref Range   SARS Coronavirus 2 NEGATIVE NEGATIVE    EKG: Orders placed or performed during the hospital encounter of 07/10/19  . EKG 12-Lead  . EKG 12-Lead  . EKG 12-Lead  . EKG 12-Lead    Imaging Studies: No results found.    Assessment: Multiparous female desires permanent sterilization UDS negative today  Plan: Laparoscopic bilateral tubal ligation using electrocautery  Lazaro ArmsLuther H Eure 07/10/2019 9:28 AM

## 2019-07-10 NOTE — Anesthesia Postprocedure Evaluation (Signed)
Anesthesia Post Note  Patient: Rose Holden  Procedure(s) Performed: LAPAROSCOPIC BILATERAL TUBAL LIGATION USING ELECTROCAUTERY (Bilateral )  Patient location during evaluation: PACU Anesthesia Type: General Level of consciousness: awake and patient cooperative Pain management: pain level controlled Vital Signs Assessment: post-procedure vital signs reviewed and stable Respiratory status: spontaneous breathing, respiratory function stable and nonlabored ventilation Cardiovascular status: blood pressure returned to baseline Postop Assessment: no apparent nausea or vomiting Anesthetic complications: no     Last Vitals:  Vitals:   07/10/19 1145 07/10/19 1155  BP: 124/77 123/82  Pulse: 62 (!) 58  Resp: 10 18  Temp:  36.6 C  SpO2: 100% (!) 19%    Last Pain:  Vitals:   07/10/19 1155  TempSrc: Oral  PainSc: 0-No pain                 Waco Foerster J

## 2019-07-10 NOTE — Anesthesia Preprocedure Evaluation (Signed)
Anesthesia Evaluation  Patient identified by MRN, date of birth, ID band Patient awake    Reviewed: Allergy & Precautions, NPO status , Patient's Chart, lab work & pertinent test results  Airway Mallampati: III  TM Distance: >3 FB Neck ROM: Full    Dental  (+) Missing,    Pulmonary Current SmokerPatient did not abstain from smoking.,    Pulmonary exam normal        Cardiovascular Normal cardiovascular exam     Neuro/Psych PSYCHIATRIC DISORDERS Depression  Neuromuscular disease (bell's palsy)    GI/Hepatic (+)     substance abuse  cocaine use and marijuana use,   Endo/Other  Morbid obesity  Renal/GU      Musculoskeletal   Abdominal (+) + obese,   Peds  Hematology   Anesthesia Other Findings   Reproductive/Obstetrics                             Anesthesia Physical Anesthesia Plan  ASA: III  Anesthesia Plan: General   Post-op Pain Management:    Induction: Intravenous  PONV Risk Score and Plan: 3 and Ondansetron, Dexamethasone and Midazolam  Airway Management Planned: Oral ETT  Additional Equipment:   Intra-op Plan:   Post-operative Plan:   Informed Consent: I have reviewed the patients History and Physical, chart, labs and discussed the procedure including the risks, benefits and alternatives for the proposed anesthesia with the patient or authorized representative who has indicated his/her understanding and acceptance.     Dental advisory given  Plan Discussed with: CRNA  Anesthesia Plan Comments:         Anesthesia Quick Evaluation

## 2019-07-10 NOTE — Discharge Instructions (Signed)
Laparoscopic Tubal Ligation, Care After °This sheet gives you information about how to care for yourself after your procedure. Your health care provider may also give you more specific instructions. If you have problems or questions, contact your health care provider. °What can I expect after the procedure? °After the procedure, it is common to have: °· A sore throat. °· Discomfort in your shoulder. °· Mild discomfort or cramping in your abdomen. °· Gas pains. °· Pain or soreness in the area where the surgical incision was made. °· A bloated feeling. °· Tiredness. °· Nausea. °· Vomiting. °Follow these instructions at home: °Medicines °· Take over-the-counter and prescription medicines only as told by your health care provider. °· Do not take aspirin because it can cause bleeding. °· Ask your health care provider if the medicine prescribed to you: °? Requires you to avoid driving or using heavy machinery. °? Can cause constipation. You may need to take actions to prevent or treat constipation, such as: °§ Drink enough fluid to keep your urine pale yellow. °§ Take over-the-counter or prescription medicines. °§ Eat foods that are high in fiber, such as beans, whole grains, and fresh fruits and vegetables. °§ Limit foods that are high in fat and processed sugars, such as fried or sweet foods. °Incision care ° °  ° °· Follow instructions from your health care provider about how to take care of your incision. Make sure you: °? Wash your hands with soap and water before and after you change your bandage (dressing). If soap and water are not available, use hand sanitizer. °? Change your dressing as told by your health care provider. °? Leave stitches (sutures), skin glue, or adhesive strips in place. These skin closures may need to stay in place for 2 weeks or longer. If adhesive strip edges start to loosen and curl up, you may trim the loose edges. Do not remove adhesive strips completely unless your health care provider  tells you to do that. °· Check your incision area every day for signs of infection. Check for: °? Redness, swelling, or pain. °? Fluid or blood. °? Warmth. °? Pus or a bad smell. °Activity °· Rest as told by your health care provider. °· Avoid sitting for a long time without moving. Get up to take short walks every 1-2 hours. This is important to improve blood flow and breathing. Ask for help if you feel weak or unsteady. °· Return to your normal activities as told by your health care provider. Ask your health care provider what activities are safe for you. °General instructions °· Do not take baths, swim, or use a hot tub until your health care provider approves. Ask your health care provider if you may take showers. You may only be allowed to take sponge baths. °· Have someone help you with your daily household tasks for the first few days. °· Keep all follow-up visits as told by your health care provider. This is important. °Contact a health care provider if: °· You have redness, swelling, or pain around your incision. °· Your incision feels warm to the touch. °· You have pus or a bad smell coming from your incision. °· The edges of your incision break open after the sutures have been removed. °· Your pain does not improve after 2-3 days. °· You have a rash. °· You repeatedly become dizzy or light-headed. °· Your pain medicine is not helping. °Get help right away if you: °· Have a fever. °· Faint. °· Have increasing   pain in your abdomen. °· Have severe pain in one or both of your shoulders. °· Have fluid or blood coming from your sutures or from your vagina. °· Have shortness of breath or difficulty breathing. °· Have chest pain or leg pain. °· Have ongoing nausea, vomiting, or diarrhea. °Summary °· After the procedure, it is common to have mild discomfort or cramping in your abdomen. °· Take over-the-counter and prescription medicines only as told by your health care provider. °· Watch for symptoms that should  prompt you to call your health care provider. °· Keep all follow-up visits as told by your health care provider. This is important. °This information is not intended to replace advice given to you by your health care provider. Make sure you discuss any questions you have with your health care provider. °Document Released: 05/20/2005 Document Revised: 09/25/2018 Document Reviewed: 09/25/2018 °Elsevier Patient Education © 2020 [employer] ° °

## 2019-07-10 NOTE — Anesthesia Procedure Notes (Signed)
Performed by: Archie Shea J, CRNA       

## 2019-07-11 ENCOUNTER — Encounter (HOSPITAL_COMMUNITY): Payer: Self-pay | Admitting: Obstetrics & Gynecology

## 2019-08-07 ENCOUNTER — Other Ambulatory Visit: Payer: Self-pay

## 2019-08-07 ENCOUNTER — Emergency Department (HOSPITAL_COMMUNITY)
Admission: EM | Admit: 2019-08-07 | Discharge: 2019-08-07 | Disposition: A | Discharge status: Home / Self Care | Payer: Medicaid Other | Attending: Emergency Medicine | Admitting: Emergency Medicine

## 2019-08-07 ENCOUNTER — Encounter (HOSPITAL_COMMUNITY): Payer: Self-pay | Admitting: Emergency Medicine

## 2019-08-07 DIAGNOSIS — H10021 Other mucopurulent conjunctivitis, right eye: Secondary | ICD-10-CM | POA: Diagnosis present

## 2019-08-07 DIAGNOSIS — F17210 Nicotine dependence, cigarettes, uncomplicated: Secondary | ICD-10-CM | POA: Diagnosis not present

## 2019-08-07 DIAGNOSIS — H579 Unspecified disorder of eye and adnexa: Secondary | ICD-10-CM | POA: Diagnosis not present

## 2019-08-07 MED ORDER — ERYTHROMYCIN 5 MG/GM OP OINT
TOPICAL_OINTMENT | Freq: Four times a day (QID) | OPHTHALMIC | Status: DC
  Administered 2019-08-07: 1 via OPHTHALMIC
  Filled 2019-08-07: qty 3.5

## 2019-08-07 NOTE — ED Triage Notes (Signed)
Right eye with greenish drainage and redness.  Denies itching or pain.

## 2019-08-07 NOTE — Discharge Instructions (Addendum)
Use 1/2 inch ribbon of antibiotic ointment to R eye every 6 hours for 5 days.

## 2019-08-11 NOTE — ED Provider Notes (Signed)
[facility] EMERGENCY DEPARTMENT Provider Note   CSN: [id number] Arrival date & time: 08/07/19  0725     History   Chief Complaint Chief Complaint  Patient presents with   Conjunctivitis    right    HPI Rose Holden is a 28 y.o. female.     HPI   28yF with R eye drainage and itching. Onset 2d ago. Crusting in eye lashes. No pain. No change in visual acuity. Doesn't wear corrective lenses.   Past Medical History:  Diagnosis Date   Bell's palsy    Chlamydia    Depression    Gonorrhea 06/27/2018   Treated POC__________   Gonorrhea    Nausea & vomiting 02/17/2016   Pollen allergies 02/17/2016   Pregnant 02/17/2016   Vaginal discharge 02/17/2016    Patient Active Problem List   Diagnosis Date Noted   SVD (spontaneous vaginal delivery) 03/17/2019   Post-dates pregnancy 03/16/2019   Gonorrhea 01/06/2019   Cocaine use complicating pregnancy, antepartum 10/22/2018   Marijuana use 10/22/2018   Supervision of normal pregnancy 10/19/2018   Late prenatal care in second trimester 10/19/2018   Smoker 10/19/2018   Atypical squamous cell changes of undetermined significance (ASCUS) on cervical cytology with negative high risk human papilloma virus (HPV) test result 03/02/2017   Pollen allergies 02/17/2016   MDD (major depressive disorder), single episode 08/15/2014    Past Surgical History:  Procedure Laterality Date   broken leg     closed reduction   LAPAROSCOPIC TUBAL LIGATION Bilateral 07/10/2019   Procedure: LAPAROSCOPIC BILATERAL TUBAL LIGATION USING ELECTROCAUTERY;  Surgeon: Lazaro Arms, MD;  Location: [facility];  Service: Gynecology;  Laterality: Bilateral;     OB History    Gravida  7   Para  5   Term  5   Preterm      AB  2   Living  5     SAB      TAB  1   Ectopic      Multiple  0   Live Births  5            Home Medications    Prior to Admission medications   Medication Sig Start Date End Date Taking? Authorizing  Provider  acetaminophen (TYLENOL) 500 MG tablet Take 500 mg by mouth every 6 (six) hours as needed for mild pain or headache.    [provider]  calcium carbonate (TUMS - DOSED IN MG ELEMENTAL CALCIUM) 500 MG chewable tablet Chew 1 tablet by mouth daily as needed for indigestion or heartburn.    [provider]  HYDROcodone-acetaminophen (NORCO/VICODIN) 5-325 MG tablet Take 1 tablet by mouth every 6 (six) hours as needed. 07/10/19   Lazaro Arms, MD  ibuprofen (ADVIL) 200 MG tablet Take 400 mg by mouth every 6 (six) hours as needed for mild pain.    [provider]  ketorolac (TORADOL) 10 MG tablet Take 1 tablet (10 mg total) by mouth every 8 (eight) hours as needed. 07/10/19   Lazaro Arms, MD  ondansetron (ZOFRAN ODT) 8 MG disintegrating tablet Take 1 tablet (8 mg total) by mouth every 8 (eight) hours as needed for nausea or vomiting. 07/10/19   Lazaro Arms, MD    Family History Family History  Problem Relation Age of Onset   Hypertension Mother    Glaucoma Mother    Cancer Maternal Grandmother     Social History Social History   Tobacco Use   Smoking  status: Current Every Day Smoker    Packs/day: 0.50    Years: 11.00    Pack years: 5.50    Types: Cigarettes   Smokeless tobacco: Never Used   Tobacco comment: smokes 10 cig daily  Substance Use Topics   Alcohol use: Yes    Comment: occasionally   Drug use: Not Currently    Types: Marijuana, Cocaine    Comment: last used around 05/15/19     Allergies   Codeine   Review of Systems Review of Systems  All systems reviewed and negative, other than as noted in HPI.  Physical Exam Updated Vital Signs BP (!) 140/93 (BP Location: Right Arm)    Pulse (!) 57    Temp 97.6 F (36.4 C) (Oral)    Resp 16    Ht 5\' 2"  (1.575 m)    Wt 97.5 kg    LMP 08/01/2019 (Exact Date)    SpO2 99%    BMI 39.32 kg/m   Physical Exam Vitals signs and nursing note reviewed.  Constitutional:      General: She  is not in acute distress.    Appearance: She is well-developed.  HENT:     Head: Normocephalic and atraumatic.  Eyes:     General:        Right eye: Discharge present.        Left eye: No discharge.     Conjunctiva/sclera: Conjunctivae normal.     Comments: Mild conjunctivitis R eye otherwise normal exam  Neck:     Musculoskeletal: Neck supple.  Cardiovascular:     Rate and Rhythm: Normal rate and regular rhythm.     Heart sounds: Normal heart sounds. No murmur. No friction rub. No gallop.   Pulmonary:     Effort: Pulmonary effort is normal. No respiratory distress.     Breath sounds: Normal breath sounds.  Abdominal:     General: There is no distension.     Palpations: Abdomen is soft.     Tenderness: There is no abdominal tenderness.  Musculoskeletal:        General: No tenderness.  Skin:    General: Skin is warm and dry.  Neurological:     Mental Status: She is alert.  Psychiatric:        Behavior: Behavior normal.        Thought Content: Thought content normal.      ED Treatments / Results  Labs (all labs ordered are listed, but only abnormal results are displayed) Labs Reviewed - No data to display  EKG None  Radiology No results found.  Procedures Procedures (including critical care time)  Medications Ordered in ED Medications - No data to display   Initial Impression / Assessment and Plan / ED Course  I have reviewed the triage vital signs and the nursing notes.  Pertinent labs & imaging results that were available during my care of the patient were reviewed by me and considered in my medical decision making (see chart for details).        28yF with exam/symptoms consistent with pink eye. Hygiene discussed. Abx ointment if worsens.   Final Clinical Impressions(s) / ED Diagnoses   Final diagnoses:  Pink eye disease of right eye    ED Discharge Orders    None       Virgel Manifold, MD 08/11/19 [id number]

## 2019-08-16 ENCOUNTER — Telehealth: Payer: Self-pay | Admitting: *Deleted

## 2019-08-16 ENCOUNTER — Other Ambulatory Visit: Payer: Self-pay | Admitting: Women's Health

## 2019-08-16 IMAGING — MR MR HEAD W/O CM
8 of 10 series · 40 of 48 positions shown · non-contrast
Comparison: Brain MRI 07/23/2016.

CLINICAL DATA: 27-year-old female in the 2nd trimester of pregnancy
with acute onset blurred vision, right side weakness while eating
lunch 2 days ago. Subsequent headache.

EXAM:
MRI HEAD WITHOUT CONTRAST
TECHNIQUE: Multiplanar, multiecho pulse sequences of the brain and surrounding
structures were obtained without intravenous contrast.

[Series 4: DWI · axial · 3.0mm · 0.82mm/px · z∈[-55,+85]mm · 6 of 48 slices shown (1 of 4)]
[im 1/48]
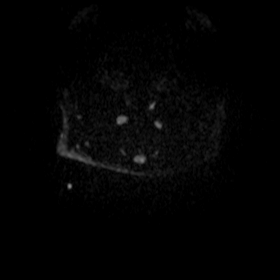
[im 10/48]
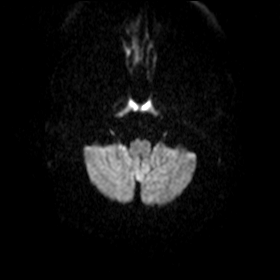
[im 19/48]
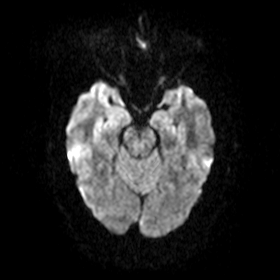
[im 29/48]
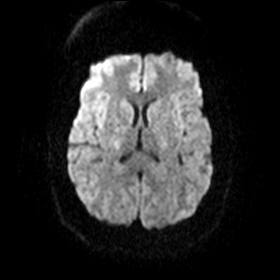
[im 38/48]
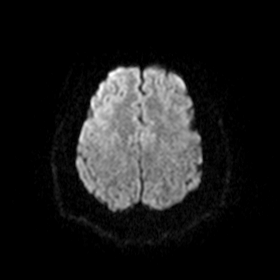
[im 48/48]
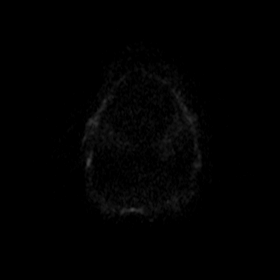

[Series 5: DWI · axial · 3.0mm · 0.82mm/px · z∈[-55,+85]mm · 6 of 48 slices shown (2 of 4)]
[im 1/48]
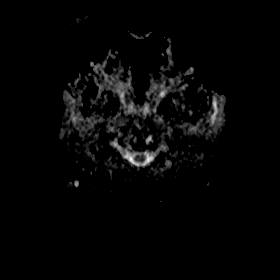
[im 10/48]
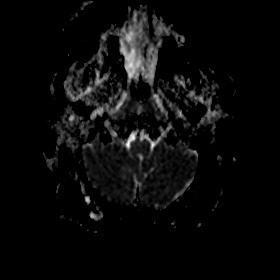
[im 19/48]
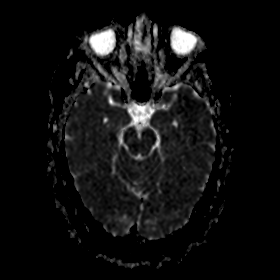
[im 29/48]
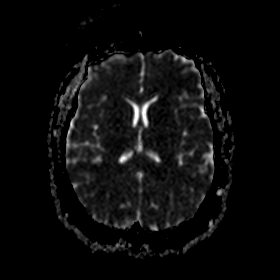
[im 38/48]
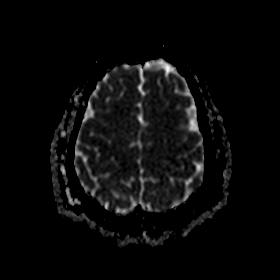
[im 48/48]
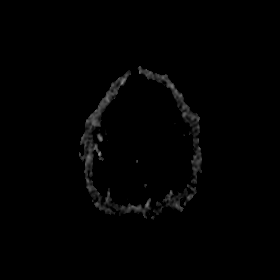

[Series 6: DWI · coronal · 5.0mm · 0.48mm/px · 4 of 34 slices shown (3 of 4)]
[im 1/34]
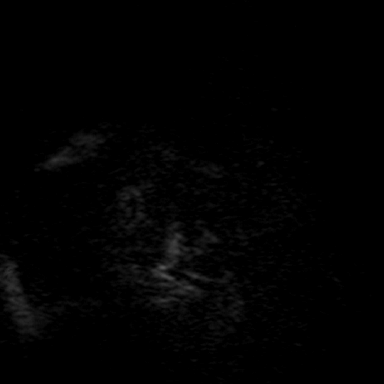
[im 12/34]
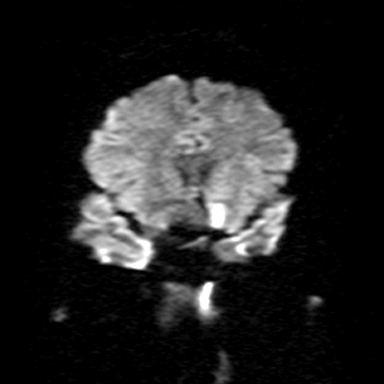
[im 23/34]
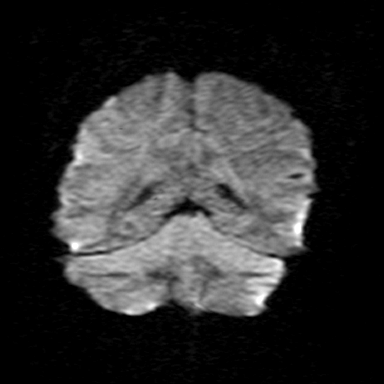
[im 34/34]
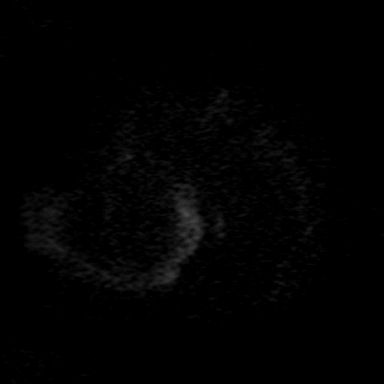

[Series 7: DWI · coronal · 5.0mm · 0.48mm/px · 4 of 34 slices shown (4 of 4)]
[im 1/34]
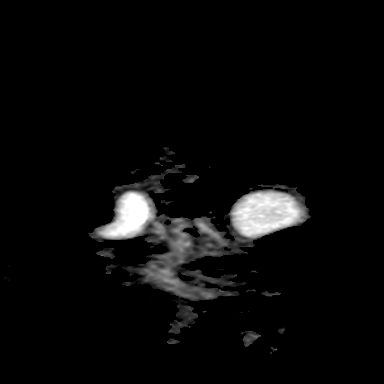
[im 12/34]
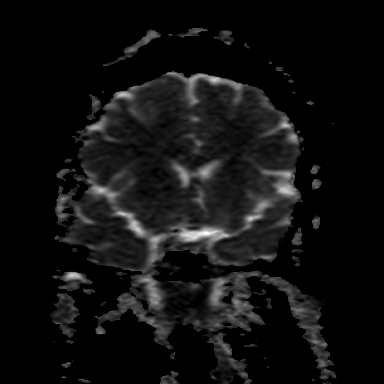
[im 23/34]
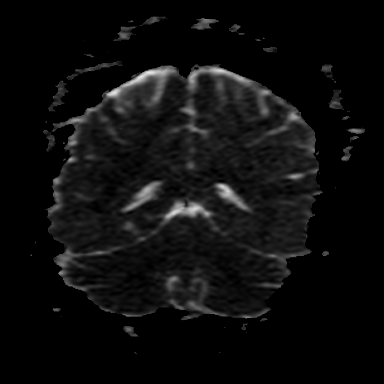
[im 34/34]
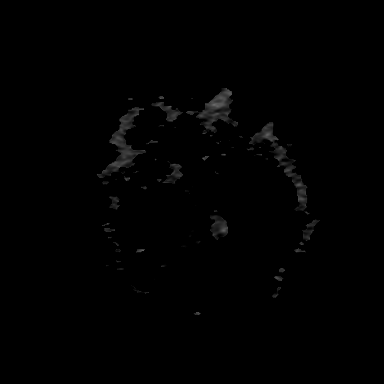

[Series 8: T2 · axial · 5.0mm · 0.75mm/px · z∈[-57,+85]mm · 3 of 23 slices shown (1 of 2)]
[im 1/23]
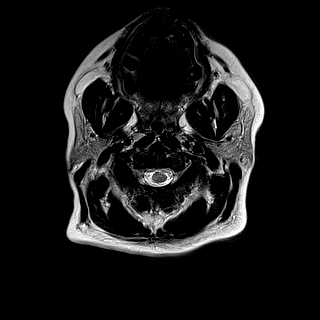
[im 12/23]
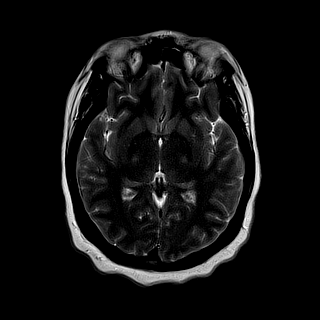
[im 23/23]
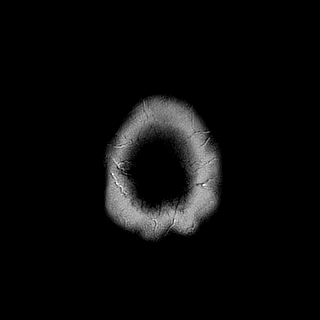

[Series 9: FLAIR · axial · 3.0mm · 0.94mm/px · z∈[-54,+82]mm · 6 of 47 slices shown]
[im 1/47]
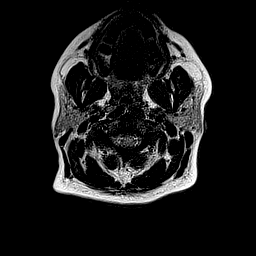
[im 10/47]
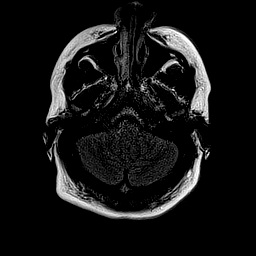
[im 19/47]
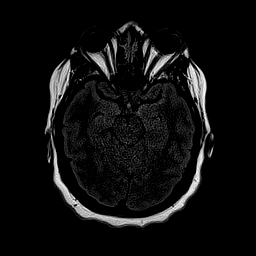
[im 28/47]
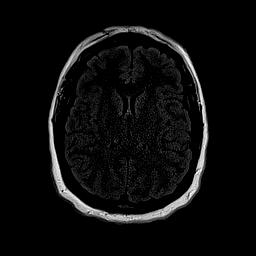
[im 37/47]
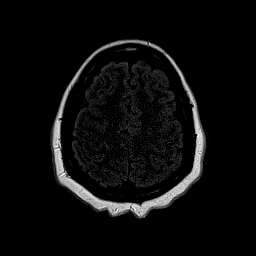
[im 47/47]
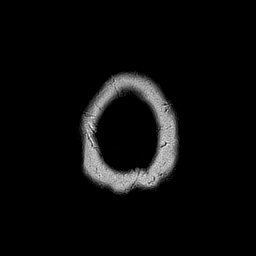

[Series 10: T1 · axial · 2.0mm · 0.47mm/px · z∈[-70,+116]mm · 8 of 95 slices shown]
[im 1/95]
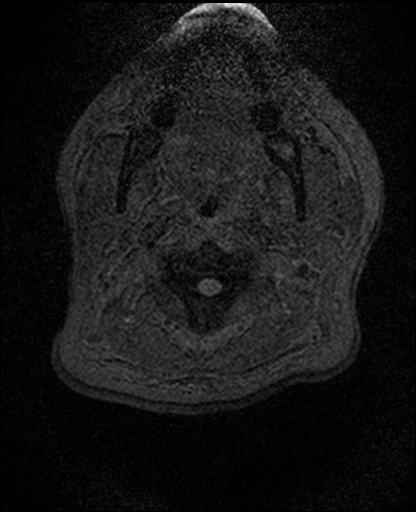
[im 19/95]
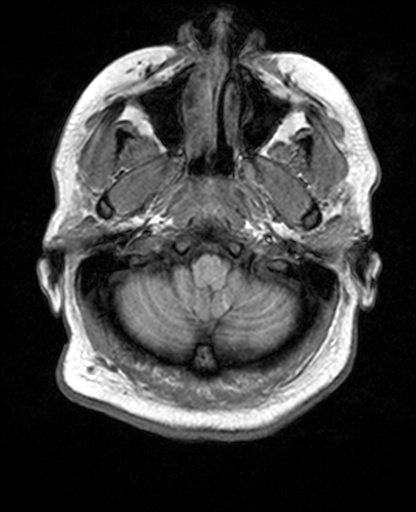
[im 29/95]
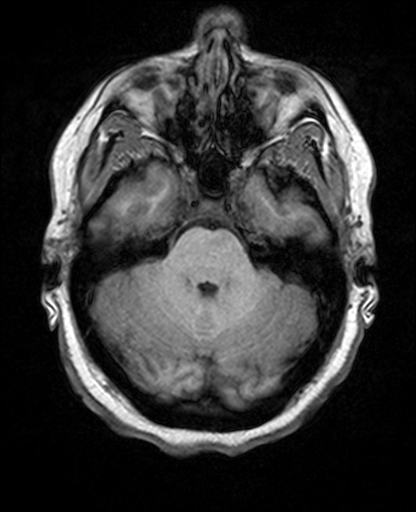
[im 38/95]
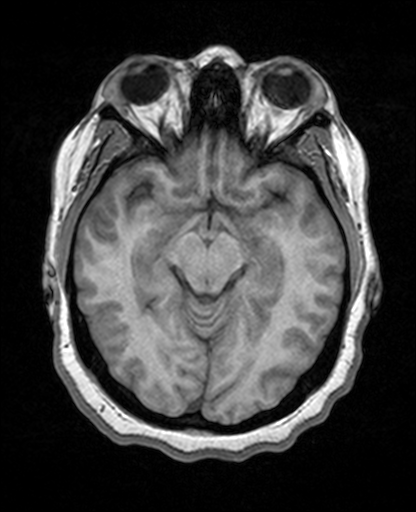
[im 57/95]
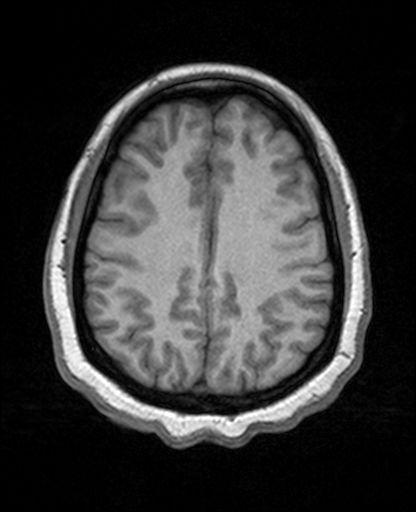
[im 66/95]
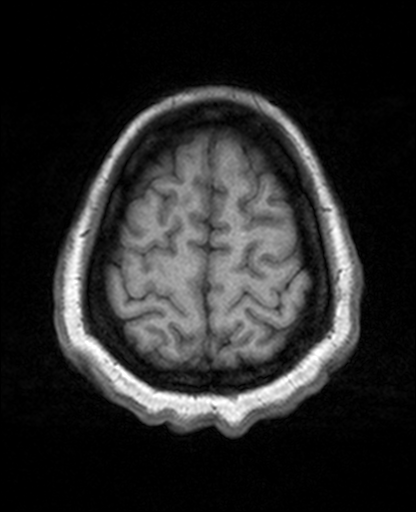
[im 76/95]
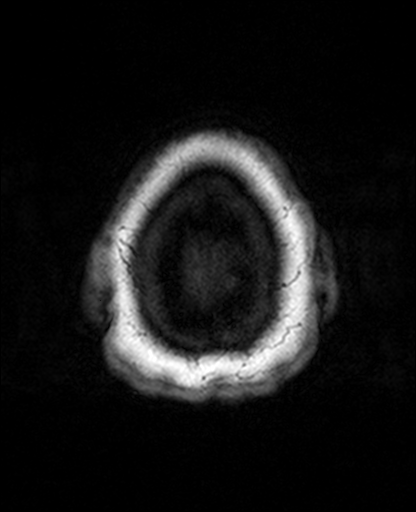
[im 95/95]
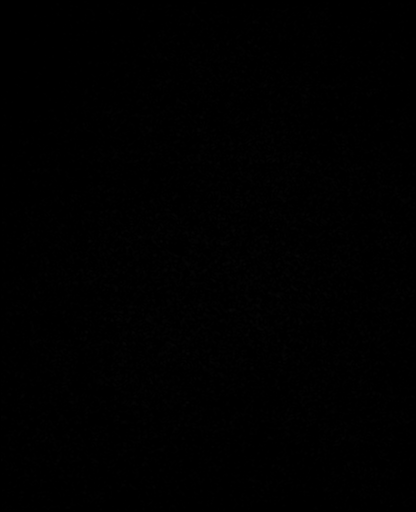

[Series 12: T2 · coronal · 5.0mm · 0.60mm/px · 3 of 28 slices shown (2 of 2)]
[im 1/28]
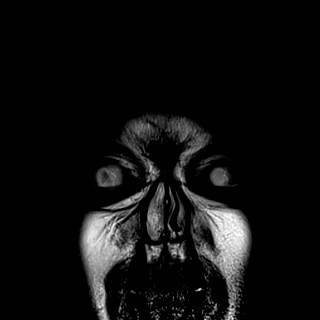
[im 14/28]
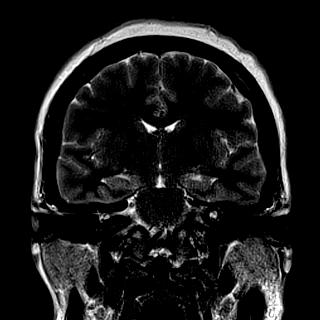
[im 28/28]
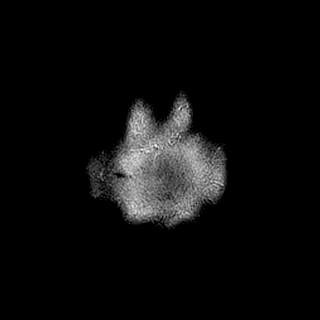

[40 of 48 positions shown; findings below may reference images not displayed]

FINDINGS: Brain: Stable and normal cerebral volume. Basilar cisterns appear
within normal limits today. No restricted diffusion to suggest acute
infarction. No midline shift, mass effect, evidence of mass lesion,
ventriculomegaly, extra-axial collection or acute intracranial
hemorrhage. Cervicomedullary junction and pituitary are within
normal limits.

Gray and white matter signal is within normal limits throughout the
brain. No chronic cerebral blood products or encephalomalacia.

Vascular: Major intracranial vascular flow voids are stable and
within normal limits.

Skull and upper cervical spine: Negative visible cervical spine.

Sinuses/Orbits: Negative orbit soft tissues. Trace paranasal sinus
mucosal thickening.

Other: Visible internal auditory structures appear normal. Mastoids
remain clear. Scalp and face soft tissues appear negative.
IMPRESSION: No acute intracranial abnormality. Normal noncontrast MRI appearance
of the brain.

## 2019-08-16 MED ORDER — METRONIDAZOLE 500 MG PO TABS: 500.0000 mg | ORAL_TABLET | Freq: Two times a day (BID) | ORAL | 0 refills | Status: DC

## 2019-08-16 NOTE — Telephone Encounter (Signed)
Pt informed to pick rx up. Scheduled for POC on 11/2.

## 2019-08-16 NOTE — Telephone Encounter (Signed)
Patient left message requesting rx for trich. Her partner went to his doctor and is positive for trich.

## 2019-09-13 ENCOUNTER — Telehealth: Payer: Self-pay | Admitting: Obstetrics & Gynecology

## 2019-09-13 NOTE — Telephone Encounter (Signed)
Tried to reach the patient to remind her of her appointment/restrictions, no answer. °

## 2019-09-16 ENCOUNTER — Other Ambulatory Visit: Payer: Medicaid Other

## 2019-09-27 ENCOUNTER — Other Ambulatory Visit: Payer: Self-pay

## 2019-10-24 DIAGNOSIS — Z5181 Encounter for therapeutic drug level monitoring: Secondary | ICD-10-CM | POA: Diagnosis not present

## 2019-10-30 DIAGNOSIS — Z5181 Encounter for therapeutic drug level monitoring: Secondary | ICD-10-CM | POA: Diagnosis not present

## 2019-11-20 ENCOUNTER — Telehealth: Payer: Self-pay | Admitting: Obstetrics & Gynecology

## 2019-11-20 NOTE — Telephone Encounter (Signed)
Called patient regarding appointment and the following message was left: ° ° °We have you scheduled for an upcoming appointment at our office. At this time, we are still not allowing visitors during the appointment, however, a support person, over age 31, may accompany you to your appointment if assistance is needed for safety or care concerns. Otherwise, support persons should remain outside until the visit is complete.  ° °We ask if you are sick, have any symptoms of COVID, have had any exposure to anyone suspected or confirmed of having COVID-19, or are awaiting test results for COVID-19, to call our office as we may need to reschedule you for a virtual visit or schedule your appointment for a later date.   ° °Please know we will ask you these questions or similar questions when you arrive for your appointment and understand this is how we are keeping everyone safe.   ° °Also,to keep you safe, please use the provided hand sanitizer when you enter the office. We are asking everyone in the office to wear a mask to help prevent the spread of °germs. If you have a mask of your own, please wear it to your appointment, if not, we are happy to provide one for you. ° °Thank you for understanding and your cooperation.  ° ° °[facility] Staff ° ° ° ° ° °

## 2019-11-21 ENCOUNTER — Other Ambulatory Visit: Payer: Medicaid Other

## 2019-11-25 ENCOUNTER — Other Ambulatory Visit: Payer: Medicaid Other

## 2019-11-26 DIAGNOSIS — Z5181 Encounter for therapeutic drug level monitoring: Secondary | ICD-10-CM | POA: Diagnosis not present

## 2019-11-27 ENCOUNTER — Telehealth: Payer: Self-pay | Admitting: *Deleted

## 2019-11-27 MED ORDER — METRONIDAZOLE 500 MG PO TABS: 500.0000 mg | ORAL_TABLET | Freq: Two times a day (BID) | ORAL | 0 refills | Status: DC

## 2019-11-27 NOTE — Addendum Note (Signed)
Addended by: Cyril Mourning A on: 11/27/2019 04:51 PM   Modules accepted: Orders

## 2019-11-27 NOTE — Telephone Encounter (Signed)
Will rx flagyl  

## 2019-11-27 NOTE — Telephone Encounter (Signed)
I called patient back to discuss her concerns. She was treated for trich because her boyfriend tested positive back in November. Patient states that she drank alcohol so shes not sure the medication worked. She is noticing the odor still. Wants to know if we can send flagyl in again.

## 2019-11-27 NOTE — Telephone Encounter (Signed)
Patient left message on nurse line that she was prescribed an antibiotic but the medication did not work. States she drank alcohol so maybe this was why.  She is requesting a call back.

## 2019-12-03 DIAGNOSIS — Z5181 Encounter for therapeutic drug level monitoring: Secondary | ICD-10-CM | POA: Diagnosis not present

## 2019-12-10 DIAGNOSIS — Z5181 Encounter for therapeutic drug level monitoring: Secondary | ICD-10-CM | POA: Diagnosis not present

## 2019-12-17 DIAGNOSIS — Z5181 Encounter for therapeutic drug level monitoring: Secondary | ICD-10-CM | POA: Diagnosis not present

## 2019-12-24 DIAGNOSIS — Z5181 Encounter for therapeutic drug level monitoring: Secondary | ICD-10-CM | POA: Diagnosis not present

## 2019-12-31 DIAGNOSIS — Z5181 Encounter for therapeutic drug level monitoring: Secondary | ICD-10-CM | POA: Diagnosis not present

## 2020-01-01 ENCOUNTER — Telehealth: Payer: Self-pay | Admitting: Adult Health

## 2020-01-01 NOTE — Telephone Encounter (Signed)

## 2020-01-02 ENCOUNTER — Other Ambulatory Visit: Payer: Medicaid Other | Admitting: Adult Health

## 2020-01-14 ENCOUNTER — Telehealth: Payer: Self-pay | Admitting: Women's Health

## 2020-01-14 NOTE — Telephone Encounter (Signed)

## 2020-01-15 ENCOUNTER — Other Ambulatory Visit: Payer: Medicaid Other | Admitting: Women's Health

## 2020-02-19 ENCOUNTER — Telehealth: Payer: Self-pay | Admitting: Obstetrics & Gynecology

## 2020-02-19 NOTE — Telephone Encounter (Signed)
Tried to the reach the patient to remind her of her appointment/restrictions, cell number - call could not be completed at this time, home number not working.

## 2020-02-20 ENCOUNTER — Other Ambulatory Visit (HOSPITAL_COMMUNITY)
Admission: RE | Admit: 2020-02-20 | Discharge: 2020-02-20 | Disposition: A | Discharge status: Home / Self Care | Payer: Medicaid Other | Source: Ambulatory Visit | Attending: Obstetrics & Gynecology | Admitting: Obstetrics & Gynecology

## 2020-02-20 ENCOUNTER — Other Ambulatory Visit (INDEPENDENT_AMBULATORY_CARE_PROVIDER_SITE_OTHER): Payer: Medicaid Other

## 2020-02-20 ENCOUNTER — Other Ambulatory Visit: Payer: Self-pay

## 2020-02-20 DIAGNOSIS — Z113 Encounter for screening for infections with a predominantly sexual mode of transmission: Secondary | ICD-10-CM

## 2020-02-20 NOTE — Progress Notes (Signed)
   NURSE VISIT- VAGINITIS/STD/POC  SUBJECTIVE:  Rose Holden is a 29 y.o. [phone] female here for a vaginal swab for std screening   She reports  No symptoms. Partner never treated for trichomoniasis   OBJECTIVE:  There were no vitals taken for this visit.  Appears well, in no apparent distress  ASSESSMENT: Vaginal swab for std screening  PLAN: Self-collected vaginal probe for std screening sent to lab Treatment: to be determined once results are received Follow-up as needed if symptoms persist/worsen, or new symptoms develop  Rennis Petty  02/20/2020 11:11 AM

## 2020-02-21 LAB — CERVICOVAGINAL ANCILLARY ONLY
Chlamydia: NEGATIVE
Comment: NEGATIVE
Comment: NEGATIVE
Comment: NORMAL
Neisseria Gonorrhea: NEGATIVE
Trichomonas: NEGATIVE

## 2020-02-24 ENCOUNTER — Telehealth: Payer: Self-pay | Admitting: Women's Health

## 2020-02-24 NOTE — Telephone Encounter (Signed)
Left message letting pt know can schedule an appt. Advised to call back to schedule. JSY

## 2020-02-24 NOTE — Telephone Encounter (Signed)
Pt aware her CV swab was negative for trich, GC/CHL. Pt concerned that she has a vaginal odor. Please advise. Thanks!! JSY

## 2020-02-24 NOTE — Telephone Encounter (Signed)
Pt would like a call with lab results.

## 2020-05-04 ENCOUNTER — Telehealth: Payer: Self-pay | Admitting: Adult Health

## 2020-05-04 NOTE — Telephone Encounter (Signed)
Pt did self swab in [place] was negative wants to be checked,to come in am at 8:30

## 2020-05-04 NOTE — Telephone Encounter (Signed)
Patient called stating that she would like a call back from [facility] patient did not state the reason for the call. Please contact pt

## 2020-05-05 ENCOUNTER — Other Ambulatory Visit: Payer: Self-pay

## 2020-05-05 ENCOUNTER — Encounter: Payer: Self-pay | Admitting: Adult Health

## 2020-05-05 ENCOUNTER — Ambulatory Visit (INDEPENDENT_AMBULATORY_CARE_PROVIDER_SITE_OTHER): Payer: Medicaid Other | Admitting: Adult Health

## 2020-05-05 VITALS — BP 142/97 | HR 77 | Ht 63.0 in | Wt 231.5 lb

## 2020-05-05 DIAGNOSIS — Z113 Encounter for screening for infections with a predominantly sexual mode of transmission: Secondary | ICD-10-CM | POA: Diagnosis not present

## 2020-05-05 DIAGNOSIS — N898 Other specified noninflammatory disorders of vagina: Secondary | ICD-10-CM

## 2020-05-05 NOTE — Progress Notes (Signed)
  Subjective:     Patient ID: Rose Holden, female   DOB: 04-22-1991, 29 y.o.   MRN: [id number]  HPI Lashena is a 29 year old black female,single, [id number] in requesting STD testing, has odor and itching at times. She had negative CV swab in [place] that was self collected and she thinks she did not do it right.  PCP is [facility].   Review of Systems Vaginal odor at times Vaginal itching at times Not currently having sex, he is in jail Reviewed past medical,surgical, social and family history. Reviewed medications and allergies.     Objective:   Physical Exam BP (!) 142/97 (BP Location: Right Arm, Patient Position: Sitting, Cuff Size: Large)   Pulse 77   Ht 5\' 3"  (1.6 m)   Wt 231 lb 8 oz (105 kg)   LMP 04/14/2020 (Exact Date)   Breastfeeding No   BMI 41.01 kg/m  Skin warm and dry.Pelvic: external genitalia is normal in appearance no lesions, vagina: scant white discharge without odor,urethra has no lesions or masses noted, cervix:smooth and bulbous, uterus: normal size, shape and contour, non tender, no masses felt, adnexa: no masses or tenderness noted. Bladder is non tender and no masses felt. Nuswab obtained   Examination chaperoned by 06/14/2020 LPN Assessment:     1. Vaginal odor nuswab sent   2. Screening examination for STD (sexually transmitted disease) Nuswab sent    Plan:     Follow up prn Pap in 2022

## 2020-05-07 ENCOUNTER — Telehealth: Payer: Self-pay | Admitting: Adult Health

## 2020-05-07 ENCOUNTER — Other Ambulatory Visit: Payer: Self-pay | Admitting: Adult Health

## 2020-05-07 LAB — NUSWAB VAGINITIS PLUS (VG+)
Atopobium vaginae: HIGH Score — AB
BVAB 2: HIGH Score — AB
Candida albicans, NAA: NEGATIVE
Candida glabrata, NAA: NEGATIVE
Chlamydia trachomatis, NAA: NEGATIVE
Megasphaera 1: HIGH Score — AB
Neisseria gonorrhoeae, NAA: NEGATIVE
Trich vag by NAA: NEGATIVE

## 2020-05-07 MED ORDER — METRONIDAZOLE 500 MG PO TABS: 500.0000 mg | ORAL_TABLET | Freq: Two times a day (BID) | ORAL | 0 refills | Status: DC

## 2020-05-07 NOTE — Progress Notes (Signed)
rx flagyl +BV on nuswab 

## 2020-05-07 NOTE — Telephone Encounter (Signed)
No VM, if she call has +BV on nuswab rx'd flagyl

## 2020-05-08 DIAGNOSIS — H16223 Keratoconjunctivitis sicca, not specified as Sjogren's, bilateral: Secondary | ICD-10-CM | POA: Diagnosis not present

## 2020-05-08 DIAGNOSIS — H40033 Anatomical narrow angle, bilateral: Secondary | ICD-10-CM | POA: Diagnosis not present

## 2020-05-10 DIAGNOSIS — H5213 Myopia, bilateral: Secondary | ICD-10-CM | POA: Diagnosis not present

## 2020-07-02 ENCOUNTER — Other Ambulatory Visit: Payer: Medicaid Other

## 2020-07-02 ENCOUNTER — Other Ambulatory Visit: Payer: Self-pay

## 2020-07-02 DIAGNOSIS — Z20822 Contact with and (suspected) exposure to covid-19: Secondary | ICD-10-CM

## 2020-07-03 LAB — SARS-COV-2, NAA 2 DAY TAT

## 2020-07-03 LAB — NOVEL CORONAVIRUS, NAA: SARS-CoV-2, NAA: NOT DETECTED

## 2020-07-09 ENCOUNTER — Other Ambulatory Visit: Payer: Self-pay | Admitting: Adult Health

## 2020-07-09 MED ORDER — METRONIDAZOLE 500 MG PO TABS: 500.0000 mg | ORAL_TABLET | Freq: Two times a day (BID) | ORAL | 0 refills | Status: DC

## 2020-07-09 NOTE — Progress Notes (Signed)
Refill flagyl  

## 2020-07-20 ENCOUNTER — Encounter (HOSPITAL_COMMUNITY): Payer: Self-pay | Admitting: Emergency Medicine

## 2020-07-20 ENCOUNTER — Emergency Department (HOSPITAL_COMMUNITY)
Admission: EM | Admit: 2020-07-20 | Discharge: 2020-07-20 | Disposition: A | Discharge status: Home / Self Care | Payer: Medicaid Other | Attending: Emergency Medicine | Admitting: Emergency Medicine

## 2020-07-20 ENCOUNTER — Other Ambulatory Visit: Payer: Self-pay

## 2020-07-20 DIAGNOSIS — I10 Essential (primary) hypertension: Secondary | ICD-10-CM | POA: Insufficient documentation

## 2020-07-20 DIAGNOSIS — F17210 Nicotine dependence, cigarettes, uncomplicated: Secondary | ICD-10-CM | POA: Diagnosis not present

## 2020-07-20 DIAGNOSIS — Z79899 Other long term (current) drug therapy: Secondary | ICD-10-CM | POA: Insufficient documentation

## 2020-07-20 DIAGNOSIS — K029 Dental caries, unspecified: Secondary | ICD-10-CM | POA: Insufficient documentation

## 2020-07-20 DIAGNOSIS — K0889 Other specified disorders of teeth and supporting structures: Secondary | ICD-10-CM | POA: Diagnosis not present

## 2020-07-20 MED ORDER — TRAMADOL HCL 50 MG PO TABS: 50.0000 mg | ORAL_TABLET | Freq: Four times a day (QID) | ORAL | 0 refills | Status: AC | PRN

## 2020-07-20 MED ORDER — CLINDAMYCIN HCL 300 MG PO CAPS: 300.0000 mg | ORAL_CAPSULE | Freq: Three times a day (TID) | ORAL | 0 refills | Status: DC

## 2020-07-20 MED ORDER — CLINDAMYCIN HCL 150 MG PO CAPS
300.0000 mg | ORAL_CAPSULE | Freq: Once | ORAL | Status: AC
  Administered 2020-07-20: 300 mg via ORAL
  Filled 2020-07-20: qty 2

## 2020-07-20 MED ORDER — TRAMADOL HCL 50 MG PO TABS
50.0000 mg | ORAL_TABLET | Freq: Once | ORAL | Status: AC
  Administered 2020-07-20: 50 mg via ORAL
  Filled 2020-07-20: qty 1

## 2020-07-20 NOTE — Discharge Instructions (Signed)
Take the antibiotic as directed until you see your dentist on Wednesday.  Your blood pressure today is elevated.  This will need to be rechecked by your primary care provider as you may need to be started on medications to help lower your blood pressure.  The health department can manage your blood pressure for you.

## 2020-07-20 NOTE — ED Triage Notes (Signed)
Pt c/o dental pain to upper, front left tooth and two upper, right jaw teeth; pt reports she has appt Wed with dentist

## 2020-07-20 NOTE — ED Provider Notes (Signed)
[facility] EMERGENCY DEPARTMENT Provider Note   CSN: [id number] Arrival date & time: 07/20/20  1420     History Chief Complaint  Patient presents with  . Dental Pain    Rose Holden is a 29 y.o. female.  HPI      Rose Holden is a 29 y.o. female who presents to the Emergency Department complaining of bilateral upper dental pain.  She reports gradually worsening dental pain for several days and began after eating pizza.  She reports having two right upper teeth that have "broken off" due to dental decay.  She has an appointment with her dentist in two days, but here today for pain control stating that over the counter pain medications are not helping.      Past Medical History:  Diagnosis Date  . Bell's palsy   . Chlamydia   . Depression   . Gonorrhea 06/27/2018   Treated POC__________  . Gonorrhea   . Nausea & vomiting 02/17/2016  . Pollen allergies 02/17/2016  . Pregnant 02/17/2016  . Vaginal discharge 02/17/2016    Patient Active Problem List   Diagnosis Date Noted  . SVD (spontaneous vaginal delivery) 03/17/2019  . Post-dates pregnancy 03/16/2019  . Gonorrhea 01/06/2019  . Cocaine use complicating pregnancy, antepartum 10/22/2018  . Marijuana use 10/22/2018  . Supervision of normal pregnancy 10/19/2018  . Late prenatal care in second trimester 10/19/2018  . Smoker 10/19/2018  . Atypical squamous cell changes of undetermined significance (ASCUS) on cervical cytology with negative high risk human papilloma virus (HPV) test result 03/02/2017  . Pollen allergies 02/17/2016  . MDD (major depressive disorder), single episode 08/15/2014    Past Surgical History:  Procedure Laterality Date  . broken leg     closed reduction  . LAPAROSCOPIC TUBAL LIGATION Bilateral 07/10/2019   Procedure: LAPAROSCOPIC BILATERAL TUBAL LIGATION USING ELECTROCAUTERY;  Surgeon: Lazaro Arms, MD;  Location: [facility];  Service: Gynecology;  Laterality: Bilateral;     OB History    Gravida   7   Para  5   Term  5   Preterm      AB  2   Living  5     SAB      TAB  1   Ectopic      Multiple  0   Live Births  5           Family History  Problem Relation Age of Onset  . Hypertension Mother   . Glaucoma Mother   . Cancer Maternal Grandmother     Social History   Tobacco Use  . Smoking status: Current Every Day Smoker    Packs/day: 0.50    Years: 11.00    Pack years: 5.50    Types: Cigarettes  . Smokeless tobacco: Never Used  . Tobacco comment: smokes 10 cig daily  Vaping Use  . Vaping Use: Never used  Substance Use Topics  . Alcohol use: Yes    Comment: occasionally  . Drug use: Not Currently    Types: Marijuana, Cocaine    Comment: last used around 05/15/19    Home Medications Prior to Admission medications   Medication Sig Start Date End Date Taking? Authorizing Provider  metroNIDAZOLE (FLAGYL) 500 MG tablet Take 1 tablet (500 mg total) by mouth 2 (two) times daily. 07/09/20   Adline Potter, NP    Allergies    Codeine  Review of Systems   Review of Systems  Constitutional: Negative for appetite  change and fever.  HENT: Positive for dental problem. Negative for congestion, facial swelling, sore throat and trouble swallowing.   Eyes: Negative for pain and visual disturbance.  Cardiovascular: Negative for chest pain.  Gastrointestinal: Negative for nausea and vomiting.  Musculoskeletal: Negative for neck pain and neck stiffness.  Skin: Negative for rash.  Neurological: Negative for dizziness, facial asymmetry and headaches.  Hematological: Negative for adenopathy.    Physical Exam Updated Vital Signs BP (!) 145/101 (BP Location: Right Arm)   Pulse 68   Temp 97.9 F (36.6 C) (Oral)   Resp 20   Ht 5\' 3"  (1.6 m)   Wt 106.1 kg   LMP 06/28/2020   SpO2 100%   BMI 41.45 kg/m   Physical Exam Vitals and nursing note reviewed.  Constitutional:      General: She is not in acute distress.    Appearance: Normal appearance.  She is well-developed. She is not ill-appearing.  HENT:     Head: Normocephalic and atraumatic.     Jaw: No trismus.     Right Ear: Tympanic membrane and ear canal normal.     Left Ear: Tympanic membrane and ear canal normal.     Mouth/Throat:     Mouth: Mucous membranes are moist.     Dentition: Dental caries present. No dental abscesses.     Pharynx: Uvula midline. No uvula swelling.     Comments: Multiple dental caries with tenderness to palpation of the left upper premolars right upper molars.  Dental decay noted of the right upper molars with decay noted of the first molar to the gumline.  Mild erythema of the surrounding gingiva, no fluctuance or edema.  No facial edema.  Uvula is midline and nonedematous.  No trismus. Cardiovascular:     Rate and Rhythm: Normal rate and regular rhythm.     Pulses: Normal pulses.  Pulmonary:     Effort: Pulmonary effort is normal.     Breath sounds: Normal breath sounds.  Musculoskeletal:        General: Normal range of motion.     Cervical back: Normal range of motion and neck supple.  Lymphadenopathy:     Cervical: No cervical adenopathy.  Skin:    General: Skin is warm.     Capillary Refill: Capillary refill takes less than 2 seconds.  Neurological:     General: No focal deficit present.     Mental Status: She is alert.     Motor: No abnormal muscle tone.     Coordination: Coordination normal.     ED Results / Procedures / Treatments   Labs (all labs ordered are listed, but only abnormal results are displayed) Labs Reviewed - No data to display  EKG None  Radiology No results found.  Procedures Procedures (including critical care time)  Medications Ordered in ED Medications  clindamycin (CLEOCIN) capsule 300 mg (has no administration in time range)  traMADol (ULTRAM) tablet 50 mg (has no administration in time range)    ED Course  I have reviewed the triage vital signs and the nursing notes.  Pertinent labs & imaging  results that were available during my care of the patient were reviewed by me and considered in my medical decision making (see chart for details).    MDM Rules/Calculators/A&P                          Patient here with dental pain was noted to be hypertensive.  No history of hypertension.  On exam she has multiple dental caries and poor dentition.  No trismus, or obvious dental abscess.  No concerning symptoms for Ludewig's angina.  Patient has a appointment with her dentist for Wednesday, will start antibiotics.  Discussed finding of hypertension and she agrees to follow-up with her primary care provider at the health department.  Final Clinical Impression(s) / ED Diagnoses Final diagnoses:  Pain, dental  Hypertension, unspecified type    Rx / DC Orders ED Discharge Orders    None       Pauline Aus, PA-C 07/20/20 1841    Vanetta Mulders, MD 07/30/20 1057

## 2020-07-22 ENCOUNTER — Telehealth: Payer: Self-pay | Admitting: *Deleted

## 2020-07-22 NOTE — Telephone Encounter (Signed)
Contacted patient to complete Transition of Care Assessment. The patient states she just left the dentists office from having her teeth pulled. She would like to be called back on 07/23/20. Will call back per patient request.  Burnard Bunting, RN, BSN, CCRN Patient Engagement Center [phone]

## 2020-07-23 ENCOUNTER — Telehealth: Payer: Self-pay | Admitting: *Deleted

## 2020-07-23 NOTE — Telephone Encounter (Signed)
Transition Care Management Unsuccessful Follow-up Telephone Call  Date of discharge and from where: 07/20/20, [facility]  Attempts:  2nd Attempt  Reason for unsuccessful TCM follow-up call:  Unable to leave message.  Burnard Bunting, RN, BSN, CCRN Patient Engagement Center [phone]

## 2020-07-24 ENCOUNTER — Telehealth: Payer: Self-pay | Admitting: *Deleted

## 2020-07-24 NOTE — Telephone Encounter (Signed)
Contacted patient to complete Transition of Care Assessment: Transition Care Management Follow-up Telephone Call  Date of discharge and from where: 07/20/20, [facility]  How have you been since you were released from the hospital? "I'm ok"  Any questions or concerns? No  Items Reviewed:  Did the pt receive and understand the discharge instructions provided? Yes   Medications obtained and verified? Yes   Any new allergies since your discharge? No   Dietary orders reviewed? YES  Do you have support at home? Yes   Functional Questionnaire: (I = Independent and D = Dependent) ADLs: I  Bathing/Dressing- I  Meal Prep- I  Eating- I  Maintaining continence- I  Transferring/Ambulation- I  Managing Meds- I  Follow up appointments reviewed:   PCP Hospital f/u appt confirmed? No Patient would like to be assigned a PCP.  Specialist Hospital f/u appt confirmed? Yes Patient seen at [facility] and had teeth removed.  Are transportation arrangements needed? No   If their condition worsens, is the pt aware to call PCP or go to the Emergency Dept.? YES  Was the patient provided with contact information for the PCP's office or ED? YES  Was to pt encouraged to call back with questions or concerns? YES  Burnard Bunting, RN, BSN, CCRN Patient Engagement Center [phone]

## 2020-09-09 ENCOUNTER — Other Ambulatory Visit: Payer: Self-pay

## 2020-09-09 ENCOUNTER — Ambulatory Visit (INDEPENDENT_AMBULATORY_CARE_PROVIDER_SITE_OTHER): Payer: Medicaid Other | Admitting: Advanced Practice Midwife

## 2020-09-09 ENCOUNTER — Encounter: Payer: Self-pay | Admitting: Advanced Practice Midwife

## 2020-09-09 VITALS — BP 114/80 | HR 69 | Wt 230.0 lb

## 2020-09-09 DIAGNOSIS — N898 Other specified noninflammatory disorders of vagina: Secondary | ICD-10-CM | POA: Diagnosis not present

## 2020-09-09 DIAGNOSIS — Z113 Encounter for screening for infections with a predominantly sexual mode of transmission: Secondary | ICD-10-CM

## 2020-09-09 NOTE — Patient Instructions (Signed)
Water Works Douching Device  PubMed   TI  Douching for perceived vaginal odor with no infectious cause of vaginitis: a randomized controlled trial.  [place]  Ginette Otto, Chatwani A, Brovender H, Zane R, [place], [place] JD  SO  J Low Genit Tract Dis. 2011 Apr;15(2):128-33.    OBJECTIVE To demonstrate the effectiveness of medical-grade stainless steel Water Works Douching Device for treating abnormal vaginal odor in comparison with a commercially available over-the-counter plastic douching device. MATERIALS AND METHODS In a multicenter study, 140 women with perceived vaginal odor with no vaginal infection were randomized to either Water Works or control group in a 1:1 ratio and were douched daily for 4 weeks. A visual analog scale (VAS) was used to assess the intensity of vaginal odor. Primary outcome included subject assessment of odor improvement and Nugent Gram stain score of vaginal secretions. Secondary outcome compared the efficacy and safety of Water Works with control douching device. Each patient underwent baseline, week 2, and week 4 visits. RESULTS The final analytic sample consisted of 96 women. Success score at 4 weeks was 78% for the Water Works group and 38.5% for the control group. Mean VAS was significantly reduced, and Nugent and Lactobacillus scores were maintained in both groups. In the Water Works group, VAS was reduced from 7.30.3 to 1.80.6 (p<.001) after 4 weeks. In the control group, baseline versus 4 weeks VAS was 7.20.3 and 3.40.8 (p<.003). CONCLUSIONS Women reported significant reduction of vaginal odor after douching with water for 4 weeks without any alteration of vaginal flora. The Water Works Douching Device was superior to over- the-counter device in reducing vaginal odor. AD  [facility], [place], [place], [place].

## 2020-09-09 NOTE — Progress Notes (Signed)
[facility] Clinic Visit  Patient name: Rose Holden MRN [id number]  Date of birth: December 02, 1990  CC & HPI:  [place] D Montero is a 29 y.o. African American female presenting today for vaginal odor.  Took Flagyl about 2-3 weeks ago, felt better while on the pills, but sx returned after she stopped taking them. No c/o irritation or abnormal discharge, but says has odor of "burnt fishy popcorn". Nuswab in June pointed towards BV. Also desires STD testing, declines bloodwork  Pertinent History Reviewed:  Medical & Surgical Hx:   Past Medical History:  Diagnosis Date  . Bell's palsy   . Chlamydia   . Depression   . Gonorrhea 06/27/2018   Treated POC__________  . Gonorrhea   . Nausea & vomiting 02/17/2016  . Pollen allergies 02/17/2016  . Pregnant 02/17/2016  . Vaginal discharge 02/17/2016   Past Surgical History:  Procedure Laterality Date  . broken leg     closed reduction  . LAPAROSCOPIC TUBAL LIGATION Bilateral 07/10/2019   Procedure: LAPAROSCOPIC BILATERAL TUBAL LIGATION USING ELECTROCAUTERY;  Surgeon: Lazaro Arms, MD;  Location: [facility];  Service: Gynecology;  Laterality: Bilateral;   Family History  Problem Relation Age of Onset  . Hypertension Mother   . Glaucoma Mother   . Cancer Maternal Grandmother     Current Outpatient Medications:  .  traMADol (ULTRAM) 50 MG tablet, Take 1 tablet (50 mg total) by mouth every 6 (six) hours as needed. (Patient not taking: Reported on 09/09/2020), Disp: 8 tablet, Rfl: 0 Social History: Reviewed -  reports that she has been smoking cigarettes. She has a 5.50 pack-year smoking history. She has never used smokeless tobacco.  Review of Systems:   Constitutional: Negative for fever and chills Eyes: Negative for visual disturbances Respiratory: Negative for shortness of breath, dyspnea Cardiovascular: Negative for chest pain or palpitations  Gastrointestinal: Negative for vomiting, diarrhea and constipation; no abdominal pain Genitourinary:  Negative for dysuria and urgency, vaginal irritation or itching Musculoskeletal: Negative for back pain, joint pain, myalgias  Neurological: Negative for dizziness and headaches    Objective Findings:    Physical Examination: Vitals:   09/09/20 1600  BP: 114/80  Pulse: 69   General appearance - well appearing, and in no distress Mental status - alert, oriented to person, place, and time Chest:  Normal respiratory effort Heart - normal rate and regular rhythm Abdomen:  Soft, nontender Pelvic: SSE:   Scant, normal appearing discharge, no detectable odor.  Wet prep negative. No erythema.  Musculoskeletal:  Normal range of motion without pain Extremities:  No edema    No results found for this or any previous visit (from the past 24 hour(s)).    Assessment & Plan:  A:   Perceived vaginal odor, no evidence of infectious source P:  NuSwab collected.  If +, will treat accordingly  Discussed probiotics for daily use if + (to prevent recurrence); could also try if Neg to see if it would help w/odor.    Info on douching w/tap water (as evidenced by a study in UpToDate) w/stainless steel (on [facility] for $16)--will discuss in more detail when labs come back.   No follow-ups on file.  Jacklyn Shell CNM 09/09/2020 4:32 PM

## 2020-09-10 DIAGNOSIS — Z113 Encounter for screening for infections with a predominantly sexual mode of transmission: Secondary | ICD-10-CM | POA: Diagnosis not present

## 2020-09-10 DIAGNOSIS — N898 Other specified noninflammatory disorders of vagina: Secondary | ICD-10-CM | POA: Diagnosis not present

## 2020-09-14 LAB — NUSWAB VAGINITIS PLUS (VG+)
Candida albicans, NAA: NEGATIVE
Candida glabrata, NAA: NEGATIVE
Chlamydia trachomatis, NAA: NEGATIVE
Neisseria gonorrhoeae, NAA: NEGATIVE
Trich vag by NAA: NEGATIVE

## 2020-10-06 ENCOUNTER — Other Ambulatory Visit: Payer: Medicaid Other | Admitting: Adult Health

## 2020-11-01 ENCOUNTER — Other Ambulatory Visit: Payer: Self-pay

## 2020-11-01 ENCOUNTER — Encounter (HOSPITAL_COMMUNITY): Payer: Self-pay | Admitting: Emergency Medicine

## 2020-11-01 ENCOUNTER — Emergency Department (HOSPITAL_COMMUNITY)
Admission: EM | Admit: 2020-11-01 | Discharge: 2020-11-01 | Disposition: A | Discharge status: Home / Self Care | Payer: Medicaid Other | Attending: Emergency Medicine | Admitting: Emergency Medicine

## 2020-11-01 DIAGNOSIS — R059 Cough, unspecified: Secondary | ICD-10-CM | POA: Insufficient documentation

## 2020-11-01 DIAGNOSIS — M791 Myalgia, unspecified site: Secondary | ICD-10-CM | POA: Diagnosis not present

## 2020-11-01 DIAGNOSIS — Z20822 Contact with and (suspected) exposure to covid-19: Secondary | ICD-10-CM | POA: Diagnosis not present

## 2020-11-01 DIAGNOSIS — R519 Headache, unspecified: Secondary | ICD-10-CM | POA: Insufficient documentation

## 2020-11-01 DIAGNOSIS — F17210 Nicotine dependence, cigarettes, uncomplicated: Secondary | ICD-10-CM | POA: Diagnosis not present

## 2020-11-01 DIAGNOSIS — B9789 Other viral agents as the cause of diseases classified elsewhere: Secondary | ICD-10-CM | POA: Diagnosis not present

## 2020-11-01 DIAGNOSIS — J069 Acute upper respiratory infection, unspecified: Secondary | ICD-10-CM | POA: Diagnosis not present

## 2020-11-01 DIAGNOSIS — R030 Elevated blood-pressure reading, without diagnosis of hypertension: Secondary | ICD-10-CM | POA: Insufficient documentation

## 2020-11-01 DIAGNOSIS — R0981 Nasal congestion: Secondary | ICD-10-CM | POA: Insufficient documentation

## 2020-11-01 LAB — RESP PANEL BY RT-PCR (FLU A&B, COVID) ARPGX2
Influenza A by PCR: NEGATIVE
Influenza B by PCR: NEGATIVE
SARS Coronavirus 2 by RT PCR: NEGATIVE

## 2020-11-01 MED ORDER — ACETAMINOPHEN 500 MG PO TABS
1000.0000 mg | ORAL_TABLET | Freq: Once | ORAL | Status: AC
  Administered 2020-11-01: 1000 mg via ORAL
  Filled 2020-11-01: qty 2

## 2020-11-01 NOTE — ED Triage Notes (Signed)
Pt states she has been exposed to covid and has to submit a negative test to return to work.  Pt c/o of a headache and diarrhea that began yesterday.

## 2020-11-01 NOTE — Discharge Instructions (Addendum)
It was our pleasure to provide your ER care today - we hope that you feel better.  Your covid/flu test was sent and should be resulted in the next couple hours - you may check MyChart or call for results.   For your health and safety, please consider getting the covid vaccine as soon as you are able.   Rest. Drink adequate fluids. Take acetaminophen or ibuprofen as need.   Your blood pressure is mildly high today - follow up with primary care doctor in the next couple weeks.   Return to ER if worse, new symptoms, increased trouble breathing, severe headache, persistent vomiting, or other concern.

## 2020-11-01 NOTE — ED Provider Notes (Signed)
[facility] EMERGENCY DEPARTMENT Provider Note   CSN: [id number] Arrival date & time: 11/01/20  1155     History Chief Complaint  Patient presents with  . Headache    [facility] is a 29 y.o. female.  Patient c/o non prod cough, body aches, intermittent dull frontal headache, congestion, for the past couple days. Symptoms acute onset, mild-mod, persistent. Denies specific known ill contact with flu or covid - has not been vaccinated for either. Denies acute, abrupt or severe head pain. No sinus pain/pressure. No ear pain. No sore throat or swallowing. No sob. No chest pain. No abd pain or nv. No fever or chills. States she needs a covid test for work as well.   The history is provided by the patient.  Headache Associated symptoms: congestion, cough and myalgias   Associated symptoms: no abdominal pain, no eye pain, no fever, no nausea, no neck pain, no neck stiffness, no sore throat and no vomiting        Past Medical History:  Diagnosis Date  . Bell's palsy   . Chlamydia   . Depression   . Gonorrhea 06/27/2018   Treated POC__________  . Gonorrhea   . Nausea & vomiting 02/17/2016  . Pollen allergies 02/17/2016  . Pregnant 02/17/2016  . Vaginal discharge 02/17/2016    Patient Active Problem List   Diagnosis Date Noted  . SVD (spontaneous vaginal delivery) 03/17/2019  . Post-dates pregnancy 03/16/2019  . Gonorrhea 01/06/2019  . Cocaine use complicating pregnancy, antepartum 10/22/2018  . Marijuana use 10/22/2018  . Supervision of normal pregnancy 10/19/2018  . Late prenatal care in second trimester 10/19/2018  . Smoker 10/19/2018  . Atypical squamous cell changes of undetermined significance (ASCUS) on cervical cytology with negative high risk human papilloma virus (HPV) test result 03/02/2017  . Pollen allergies 02/17/2016  . MDD (major depressive disorder), single episode 08/15/2014    Past Surgical History:  Procedure Laterality Date  . broken leg     closed  reduction  . LAPAROSCOPIC TUBAL LIGATION Bilateral 07/10/2019   Procedure: LAPAROSCOPIC BILATERAL TUBAL LIGATION USING ELECTROCAUTERY;  Surgeon: Lazaro Arms, MD;  Location: [facility];  Service: Gynecology;  Laterality: Bilateral;     OB History    Gravida  7   Para  5   Term  5   Preterm      AB  2   Living  5     SAB      IAB  1   Ectopic      Multiple  0   Live Births  5           Family History  Problem Relation Age of Onset  . Hypertension Mother   . Glaucoma Mother   . Cancer Maternal Grandmother     Social History   Tobacco Use  . Smoking status: Current Every Day Smoker    Packs/day: 0.50    Years: 11.00    Pack years: 5.50    Types: Cigarettes  . Smokeless tobacco: Never Used  . Tobacco comment: smokes 10 cig daily  Vaping Use  . Vaping Use: Never used  Substance Use Topics  . Alcohol use: Yes    Comment: occasionally  . Drug use: Not Currently    Types: Marijuana, Cocaine    Comment: denies    Home Medications Prior to Admission medications   Medication Sig Start Date End Date Taking? Authorizing Provider  traMADol (ULTRAM) 50 MG tablet Take 1 tablet (50  mg total) by mouth every 6 (six) hours as needed. Patient not taking: Reported on 09/09/2020 07/20/20   Pauline Aus, PA-C    Allergies    Codeine  Review of Systems   Review of Systems  Constitutional: Negative for fever.  HENT: Positive for congestion. Negative for sore throat.   Eyes: Negative for pain and redness.  Respiratory: Positive for cough. Negative for shortness of breath.   Cardiovascular: Negative for chest pain.  Gastrointestinal: Negative for abdominal pain, nausea and vomiting.  Genitourinary: Negative for flank pain.  Musculoskeletal: Positive for myalgias. Negative for neck pain and neck stiffness.  Skin: Negative for rash.  Neurological: Positive for headaches.  Hematological: Does not bruise/bleed easily.  Psychiatric/Behavioral: Negative for confusion.     Physical Exam Updated Vital Signs BP (!) 143/96 (BP Location: Right Arm)   Pulse 69   Temp 98.3 F (36.8 C) (Oral)   Resp 16   Ht 1.6 m (5\' 3" )   Wt 104.3 kg   LMP  (LMP Unknown)   SpO2 100%   BMI 40.74 kg/m   Physical Exam Vitals and nursing note reviewed.  Constitutional:      Appearance: Normal appearance. She is well-developed.  HENT:     Head: Atraumatic.     Comments: No sinus or temporal tenderness.     Nose: Nose normal.     Mouth/Throat:     Mouth: Mucous membranes are moist.  Eyes:     General: No scleral icterus.    Extraocular Movements: Extraocular movements intact.     Conjunctiva/sclera: Conjunctivae normal.     Pupils: Pupils are equal, round, and reactive to light.  Neck:     Trachea: No tracheal deviation.     Comments: No stiffness or rigidity.  Cardiovascular:     Rate and Rhythm: Normal rate and regular rhythm.     Pulses: Normal pulses.     Heart sounds: Normal heart sounds. No murmur heard. No friction rub. No gallop.   Pulmonary:     Effort: Pulmonary effort is normal. No respiratory distress.     Breath sounds: Normal breath sounds.  Abdominal:     General: Bowel sounds are normal. There is no distension.     Palpations: Abdomen is soft.     Tenderness: There is no abdominal tenderness.  Genitourinary:    Comments: No cva tenderness.  Musculoskeletal:        General: No swelling.     Cervical back: Normal range of motion and neck supple. No rigidity. No muscular tenderness.  Skin:    General: Skin is warm and dry.     Findings: No rash.  Neurological:     Mental Status: She is alert.     Comments: Alert, speech normal. Motor/sens grossly intact. Steady gait.   Psychiatric:        Mood and Affect: Mood normal.     ED Results / Procedures / Treatments   Labs (all labs ordered are listed, but only abnormal results are displayed) Labs Reviewed  RESP PANEL BY RT-PCR (FLU A&B, COVID) ARPGX2    EKG None  Radiology No  results found.  Procedures Procedures (including critical care time)  Medications Ordered in ED Medications  acetaminophen (TYLENOL) tablet 1,000 mg (has no administration in time range)    ED Course  I have reviewed the triage vital signs and the nursing notes.  Pertinent labs & imaging results that were available during my care of the patient were reviewed by  me and considered in my medical decision making (see chart for details).    MDM Rules/Calculators/A&P                          Lab/covid swab sent.   Reviewed nursing notes and prior charts for additional history.   Acetaminophen po.  Covid swab sent/pending.   Seanne D Wesby was evaluated in Emergency Department on 11/01/2020 for the symptoms described in the history of present illness. She was evaluated in the context of the global COVID-19 pandemic, which necessitated consideration that the patient might be at risk for infection with the SARS-CoV-2 virus that causes COVID-19. Institutional protocols and algorithms that pertain to the evaluation of patients at risk for COVID-19 are in a state of rapid change based on information released by regulatory bodies including the CDC and federal and state organizations. These policies and algorithms were followed during the patient's care in the ED.  Pt is breathing comfortable, and overall appears well/nad - pt appears stable for d/c.   Return precautions provided.    Final Clinical Impression(s) / ED Diagnoses Final diagnoses:  None    Rx / DC Orders ED Discharge Orders    None       Cathren Laine, MD 11/01/20 1233

## 2020-11-02 ENCOUNTER — Telehealth: Payer: Self-pay | Admitting: *Deleted

## 2020-11-02 ENCOUNTER — Other Ambulatory Visit: Payer: Medicaid Other | Admitting: Advanced Practice Midwife

## 2020-11-02 NOTE — Telephone Encounter (Signed)
Transition Care Management Unsuccessful Follow-up Telephone Call  Date of discharge and from where:  11/01/20 [facility]  Attempts:  1st Attempt  Reason for unsuccessful TCM follow-up call:  Voice mail full

## 2020-11-03 NOTE — Telephone Encounter (Signed)
Transition Care Management Follow-up Telephone Call  Date of discharge and from where: 11/01/2020 from [facility]  How have you been since you were released from the hospital? Patient states that she is feeling well.   Any questions or concerns? No  Items Reviewed:  Did the pt receive and understand the discharge instructions provided? Yes   Medications obtained and verified? No   Other? No   Any new allergies since your discharge? No   Dietary orders reviewed? Yes  Do you have support at home? Yes   Functional Questionnaire: (I = Independent and D = Dependent) ADLs: I  Bathing/Dressing- I  Meal Prep- I  Eating- I  Maintaining continence- I  Transferring/Ambulation- I  Managing Meds- I  Follow up appointments reviewed:   PCP Hospital f/u appt confirmed? No    Are transportation arrangements needed? No   If their condition worsens, is the pt aware to call PCP or go to the Emergency Dept.? Yes  Was the patient provided with contact information for the PCP's office or ED? Yes  Was to pt encouraged to call back with questions or concerns? Yes

## 2020-12-10 ENCOUNTER — Other Ambulatory Visit: Payer: Medicaid Other | Admitting: Advanced Practice Midwife

## 2021-02-22 ENCOUNTER — Other Ambulatory Visit: Payer: Medicaid Other | Admitting: Obstetrics & Gynecology

## 2021-03-02 DIAGNOSIS — Z5181 Encounter for therapeutic drug level monitoring: Secondary | ICD-10-CM | POA: Diagnosis not present

## 2021-03-16 DIAGNOSIS — Z5181 Encounter for therapeutic drug level monitoring: Secondary | ICD-10-CM | POA: Diagnosis not present

## 2021-05-05 DIAGNOSIS — Z5181 Encounter for therapeutic drug level monitoring: Secondary | ICD-10-CM | POA: Diagnosis not present

## 2023-02-12 ENCOUNTER — Emergency Department (HOSPITAL_COMMUNITY)
Admission: EM | Admit: 2023-02-12 | Discharge: 2023-02-12 | Disposition: A | Discharge status: Home / Self Care | Attending: Emergency Medicine | Admitting: Emergency Medicine

## 2023-02-12 ENCOUNTER — Other Ambulatory Visit: Payer: Self-pay

## 2023-02-12 ENCOUNTER — Encounter (HOSPITAL_COMMUNITY): Payer: Self-pay | Admitting: *Deleted

## 2023-02-12 ENCOUNTER — Emergency Department (HOSPITAL_COMMUNITY)

## 2023-02-12 DIAGNOSIS — K0889 Other specified disorders of teeth and supporting structures: Secondary | ICD-10-CM | POA: Diagnosis present

## 2023-02-12 DIAGNOSIS — K047 Periapical abscess without sinus: Secondary | ICD-10-CM | POA: Diagnosis not present

## 2023-02-12 DIAGNOSIS — R000 Tachycardia, unspecified: Secondary | ICD-10-CM | POA: Diagnosis not present

## 2023-02-12 LAB — BASIC METABOLIC PANEL
Anion gap: 9 (ref 5–15)
BUN: 14 mg/dL (ref 6–20)
CO2: 25 mmol/L (ref 22–32)
Calcium: 10.7 mg/dL — ABNORMAL HIGH (ref 8.9–10.3)
Chloride: 101 mmol/L (ref 98–111)
Creatinine, Ser: 0.96 mg/dL (ref 0.44–1.00)
GFR, Estimated: >60 mL/min (ref >60)
Glucose, Bld: 111 mg/dL — ABNORMAL HIGH (ref 70–99)
Potassium: 3.7 mmol/L (ref 3.5–5.1)
Sodium: 135 mmol/L (ref 135–145)

## 2023-02-12 LAB — CBC WITH DIFFERENTIAL/PLATELET
Abs Immature Granulocytes: 0.02 10*3/uL (ref 0.00–0.07)
Basophils Absolute: 0 10*3/uL (ref 0.0–0.1)
Basophils Relative: 0 %
Eosinophils Absolute: 0 10*3/uL (ref 0.0–0.5)
Eosinophils Relative: 1 %
HCT: 39.9 % (ref 36.0–46.0)
Hemoglobin: 12.9 g/dL (ref 12.0–15.0)
Immature Granulocytes: 0 %
Lymphocytes Relative: 18 %
Lymphs Abs: 1.4 10*3/uL (ref 0.7–4.0)
MCH: 27.6 pg (ref 26.0–34.0)
MCHC: 32.3 g/dL (ref 30.0–36.0)
MCV: 85.3 fL (ref 80.0–100.0)
Monocytes Absolute: 0.3 10*3/uL (ref 0.1–1.0)
Monocytes Relative: 4 %
Neutro Abs: 5.6 10*3/uL (ref 1.7–7.7)
Neutrophils Relative %: 77 %
Platelets: 318 10*3/uL (ref 150–400)
RBC: 4.68 MIL/uL (ref 3.87–5.11)
RDW: 13.1 % (ref 11.5–15.5)
WBC: 7.3 10*3/uL (ref 4.0–10.5)
nRBC: 0 % (ref 0.0–0.2)

## 2023-02-12 LAB — HCG, SERUM, QUALITATIVE: Preg, Serum: NEGATIVE

## 2023-02-12 MED ORDER — IOHEXOL 300 MG/ML  SOLN
75.0000 mL | Freq: Once | INTRAMUSCULAR | Status: AC | PRN
  Administered 2023-02-12: 75 mL via INTRAVENOUS

## 2023-02-12 MED ORDER — KETOROLAC TROMETHAMINE 15 MG/ML IJ SOLN
15.0000 mg | Freq: Once | INTRAMUSCULAR | Status: AC
  Administered 2023-02-12: 15 mg via INTRAVENOUS
  Filled 2023-02-12: qty 1

## 2023-02-12 NOTE — ED Triage Notes (Signed)
Pt with broken tooth to left upper 5 days ago, + facial swelling .  Had 800mg  Ibuprofen at 0900 this morning and pen VK 1000mg  at 0900 as well, pt states this is her first dose of antibiotic.

## 2023-02-12 NOTE — Discharge Instructions (Signed)
Continue the antibiotic you have been prescribed.  You can take Tylenol and Motrin as needed for pain.  Return to ED if unable to swallow, change in voice, significant facial swelling or new or concerning symptoms.

## 2023-02-12 NOTE — ED Provider Notes (Signed)
[facility] Provider Note   CSN: [id number] Arrival date & time: 02/12/23  1122     History  Chief Complaint  Patient presents with   Dental Pain    Quinette D Crichlow is a 32 y.o. female.   Dental Pain    This is a 32 year old female presenting to the emergency department due to dental pain.  It started 5 days ago with a chipped tooth, she is now having pain to the right maxilla.  She is having swelling to the face.  She was prescribed an antibiotic penicillin yesterday, she took it for the first time today but was concerned about the worsening swelling in her face.  No change in phonation, she has had fevers prior to arrival but none today.  Home Medications Prior to Admission medications   Medication Sig Start Date End Date Taking? Authorizing Provider  traMADol (ULTRAM) 50 MG tablet Take 1 tablet (50 mg total) by mouth every 6 (six) hours as needed. Patient not taking: Reported on 09/09/2020 07/20/20   Kem Parkinson, PA-C      Allergies    Codeine    Review of Systems   Review of Systems  Physical Exam Updated Vital Signs BP 109/80   Pulse (!) 121   Temp 98.3 F (36.8 C) (Oral)   Resp 18   Ht 5\' 3"  (1.6 m)   Wt 116.6 kg   LMP 01/13/2023 (Approximate)   SpO2 100%   BMI 45.53 kg/m  Physical Exam Vitals and nursing note reviewed. Exam conducted with a chaperone present.  Constitutional:      Appearance: Normal appearance.  HENT:     Head: Normocephalic and atraumatic.      Mouth/Throat:      Comments: Poor dentition.  Uvula is midline, normal phonation. Eyes:     General: No scleral icterus.       Right eye: No discharge.        Left eye: No discharge.     Extraocular Movements: Extraocular movements intact.     Pupils: Pupils are equal, round, and reactive to light.  Cardiovascular:     Rate and Rhythm: Regular rhythm. Tachycardia present.     Pulses: Normal pulses.     Heart sounds: Normal heart sounds.      No friction rub. No gallop.  Pulmonary:     Effort: Pulmonary effort is normal. No respiratory distress.     Breath sounds: Normal breath sounds.  Abdominal:     General: Abdomen is flat. Bowel sounds are normal. There is no distension.     Palpations: Abdomen is soft.     Tenderness: There is no abdominal tenderness.  Skin:    General: Skin is warm and dry.     Coloration: Skin is not jaundiced.  Neurological:     Mental Status: She is alert. Mental status is at baseline.     Coordination: Coordination normal.     Comments: No facial droop, EOMI.    ED Results / Procedures / Treatments   Labs (all labs ordered are listed, but only abnormal results are displayed) Labs Reviewed  BASIC METABOLIC PANEL  CBC WITH DIFFERENTIAL/PLATELET  HCG, SERUM, QUALITATIVE    EKG None  Radiology No results found.  Procedures Procedures    Medications Ordered in ED Medications - No data to display  ED Course/ Medical Decision Making/ A&P  Medical Decision Making Amount and/or Complexity of Data Reviewed Labs: ordered. Radiology: ordered.  Risk Prescription drug management.   Patient presents due to left-sided maxillary pain.  D there is some swelling in that area, concern for possible cellulitis despite of infection.  I do not really suspect peritonsillar abscess or retropharyngeal abscess, there is no indication of the manage on exam.  She also afebrile, does not meet SIRS criteria.  Clinically not septic although she is slightly tachycardic likely secondary to infection.  Collateral information contained by patient as well as Curator.  Patient is prescribed the penicillin, her first dose was earlier today.  I encouraged her to continue taking the medicine.  Toradol was ordered for pain, patient improved.  Stable for outpatient follow-up.        Final Clinical Impression(s) / ED Diagnoses Final diagnoses:  None    Rx / DC  Orders ED Discharge Orders     None         Sherrill Raring, Hershal Coria 02/12/23 2034    Hayden Rasmussen, MD 02/13/23 Vernelle Emerald

## 2023-06-28 ENCOUNTER — Encounter (HOSPITAL_COMMUNITY): Payer: Self-pay | Admitting: Emergency Medicine

## 2023-06-28 ENCOUNTER — Other Ambulatory Visit: Payer: Self-pay

## 2023-06-28 ENCOUNTER — Emergency Department (HOSPITAL_COMMUNITY)
Admission: EM | Admit: 2023-06-28 | Discharge: 2023-06-28 | Disposition: A | Discharge status: Home / Self Care | Payer: 59 | Attending: Emergency Medicine | Admitting: Emergency Medicine

## 2023-06-28 DIAGNOSIS — H6691 Otitis media, unspecified, right ear: Secondary | ICD-10-CM | POA: Insufficient documentation

## 2023-06-28 DIAGNOSIS — Z20822 Contact with and (suspected) exposure to covid-19: Secondary | ICD-10-CM | POA: Diagnosis not present

## 2023-06-28 DIAGNOSIS — R6884 Jaw pain: Secondary | ICD-10-CM | POA: Diagnosis not present

## 2023-06-28 DIAGNOSIS — H6690 Otitis media, unspecified, unspecified ear: Secondary | ICD-10-CM

## 2023-06-28 DIAGNOSIS — H9201 Otalgia, right ear: Secondary | ICD-10-CM | POA: Diagnosis present

## 2023-06-28 LAB — RESP PANEL BY RT-PCR (RSV, FLU A&B, COVID)  RVPGX2
Influenza A by PCR: NEGATIVE
Influenza B by PCR: NEGATIVE
Resp Syncytial Virus by PCR: NEGATIVE
SARS Coronavirus 2 by RT PCR: NEGATIVE

## 2023-06-28 MED ORDER — AMOXICILLIN-POT CLAVULANATE 875-125 MG PO TABS: 1.0000 | ORAL_TABLET | Freq: Two times a day (BID) | ORAL | 0 refills | Status: DC

## 2023-06-28 MED ORDER — ONDANSETRON 4 MG PO TBDP
4.0000 mg | ORAL_TABLET | Freq: Once | ORAL | Status: AC
  Administered 2023-06-28: 4 mg via ORAL
  Filled 2023-06-28: qty 1

## 2023-06-28 MED ORDER — ONDANSETRON 8 MG PO TBDP: 8.0000 mg | ORAL_TABLET | Freq: Once | ORAL | Status: DC

## 2023-06-28 MED ORDER — HYDROCODONE-ACETAMINOPHEN 5-325 MG PO TABS
1.0000 | ORAL_TABLET | Freq: Once | ORAL | Status: AC
  Administered 2023-06-28: 1 via ORAL
  Filled 2023-06-28: qty 1

## 2023-06-28 MED ORDER — IBUPROFEN 400 MG PO TABS
600.0000 mg | ORAL_TABLET | Freq: Once | ORAL | Status: AC
  Administered 2023-06-28: 600 mg via ORAL
  Filled 2023-06-28: qty 2

## 2023-06-28 MED ORDER — ONDANSETRON HCL 4 MG/2ML IJ SOLN: 4.0000 mg | Freq: Once | INTRAMUSCULAR | Status: DC

## 2023-06-28 NOTE — ED Triage Notes (Signed)
Pt c/o right ear/jaw/dental pain x 1 day. States not sure if its her ear or a tooth making her hurt. Mild swelling to right side of face noted. Nad.

## 2023-06-28 NOTE — ED Provider Notes (Signed)
[facility] EMERGENCY DEPARTMENT AT [facility] Provider Note   CSN: [id number] Arrival date & time: 06/28/23  1220     History  Chief Complaint  Patient presents with   Otalgia   Dental Pain    [facility] is a 32 y.o. female.  Denies any significant PMH.  Presents ER for right ear and jaw pain that started this morning, also having nausea vomiting this morning.  No trouble swallowing or breathing, no fevers or chills.  Denies dizziness or abdominal pain, denies chance of pregnancy, had tubal ligation, LMP was 2 weeks ago.   Otalgia Dental Pain      Home Medications Prior to Admission medications   Medication Sig Start Date End Date Taking? Authorizing Provider  amoxicillin-clavulanate (AUGMENTIN) 875-125 MG tablet Take 1 tablet by mouth every 12 (twelve) hours. 06/28/23  Yes Kamarri Lovvorn A, PA-C  traMADol (ULTRAM) 50 MG tablet Take 1 tablet (50 mg total) by mouth every 6 (six) hours as needed. Patient not taking: Reported on 09/09/2020 07/20/20   Pauline Aus, PA-C      Allergies    Codeine    Review of Systems   Review of Systems  HENT:  Positive for ear pain.     Physical Exam Updated Vital Signs BP (!) 127/94 (BP Location: Right Arm)   Pulse 77   Temp 98.8 F (37.1 C) (Oral)   Resp 16   LMP 06/13/2023   SpO2 100%  Physical Exam Vitals and nursing note reviewed.  Constitutional:      General: She is not in acute distress.    Appearance: She is well-developed.  HENT:     Head: Normocephalic and atraumatic.     Right Ear: Tympanic membrane normal.     Left Ear: Tympanic membrane normal.     Mouth/Throat:     Mouth: Mucous membranes are moist.  Eyes:     Conjunctiva/sclera: Conjunctivae normal.  Cardiovascular:     Rate and Rhythm: Normal rate and regular rhythm.     Heart sounds: No murmur heard. Pulmonary:     Effort: Pulmonary effort is normal. No respiratory distress.     Breath sounds: Normal breath sounds.  Abdominal:      Palpations: Abdomen is soft.     Tenderness: There is no abdominal tenderness.  Musculoskeletal:        General: No swelling.     Cervical back: Neck supple.  Skin:    General: Skin is warm and dry.     Capillary Refill: Capillary refill takes less than 2 seconds.  Neurological:     General: No focal deficit present.     Mental Status: She is alert and oriented to person, place, and time.  Psychiatric:        Mood and Affect: Mood normal.     ED Results / Procedures / Treatments   Labs (all labs ordered are listed, but only abnormal results are displayed) Labs Reviewed  RESP PANEL BY RT-PCR (RSV, FLU A&B, COVID)  RVPGX2    EKG None  Radiology No results found.  Procedures Procedures    Medications Ordered in ED Medications  HYDROcodone-acetaminophen (NORCO/VICODIN) 5-325 MG per tablet 1 tablet (has no administration in time range)  ibuprofen (ADVIL) tablet 600 mg (600 mg Oral Given 06/28/23 1332)  ondansetron (ZOFRAN-ODT) disintegrating tablet 4 mg (4 mg Oral Given 06/28/23 1332)    ED Course/ Medical Decision Making/ A&P  Medical Decision Making Ddx: Otitis Media, COVID-19, influenza, dental caries, dental abscess, TMJ, other  Course: Patient having right ear and jaw pain.  Is a poor dentition but has no specific dental tenderness, no dental abscess noted.  Otitis media noted on exam on the right.  She also having some nausea and vomiting.  COVID flu RSV pending, given Zofran ODT will p.o. challenge.  Plan on antibiotics.  She has no abdominal tenderness visibly had nausea since this morning, she is well-appearing with reassuring vitals.  Will hold off on labs less she cannot tolerate p.o. or has change in status.  No distress, lying on the bed, states ibuprofen did not help her pain at all.  States she needs something stronger.  She does have a ride home, I have her 1 dose of Norco in the ER, advised on OTC meds at home along with  antibiotics to the infection.  Amount and/or Complexity of Data Reviewed External Data Reviewed: notes. Labs: ordered.    Details: Covid flu and RSV negative  Risk Prescription drug management.           Final Clinical Impression(s) / ED Diagnoses Final diagnoses:  Acute otitis media, unspecified otitis media type    Rx / DC Orders ED Discharge Orders          Ordered    amoxicillin-clavulanate (AUGMENTIN) 875-125 MG tablet  Every 12 hours        06/28/23 1449              Josem Kaufmann 06/28/23 1451    Eber Hong, MD 07/04/23 1144

## 2023-06-28 NOTE — Discharge Instructions (Addendum)
Is a pleasure taking care of you today.  You are seen for an ear infection.  Take the antibiotics.  Follow-up close with your primary care doctor.  Come back to the ER if you have fever, severe pain, drainage from the ear or any other new or worsening symptoms.

## 2023-07-03 ENCOUNTER — Other Ambulatory Visit: Payer: Self-pay

## 2023-07-03 ENCOUNTER — Encounter (HOSPITAL_COMMUNITY): Payer: Self-pay | Admitting: *Deleted

## 2023-07-03 ENCOUNTER — Emergency Department (HOSPITAL_COMMUNITY)
Admission: EM | Admit: 2023-07-03 | Discharge: 2023-07-03 | Disposition: A | Discharge status: Home / Self Care | Payer: 59 | Attending: Emergency Medicine | Admitting: Emergency Medicine

## 2023-07-03 DIAGNOSIS — K0889 Other specified disorders of teeth and supporting structures: Secondary | ICD-10-CM | POA: Insufficient documentation

## 2023-07-03 DIAGNOSIS — R6884 Jaw pain: Secondary | ICD-10-CM | POA: Diagnosis present

## 2023-07-03 MED ORDER — IBUPROFEN 600 MG PO TABS: 600.0000 mg | ORAL_TABLET | Freq: Four times a day (QID) | ORAL | 0 refills | Status: AC | PRN

## 2023-07-03 MED ORDER — BUPIVACAINE-EPINEPHRINE (PF) 0.5% -1:200000 IJ SOLN
1.8000 mL | Freq: Once | INTRAMUSCULAR | Status: AC
  Administered 2023-07-03: 1.8 mL
  Filled 2023-07-03: qty 1.8

## 2023-07-03 MED ORDER — HYDROCODONE-ACETAMINOPHEN 5-325 MG PO TABS: 1.0000 | ORAL_TABLET | Freq: Four times a day (QID) | ORAL | 0 refills | Status: AC | PRN

## 2023-07-03 MED ORDER — HYDROCODONE-ACETAMINOPHEN 5-325 MG PO TABS
1.0000 | ORAL_TABLET | Freq: Once | ORAL | Status: AC
  Administered 2023-07-03: 1 via ORAL
  Filled 2023-07-03: qty 1

## 2023-07-03 MED ORDER — AMOXICILLIN-POT CLAVULANATE 875-125 MG PO TABS: 1.0000 | ORAL_TABLET | Freq: Two times a day (BID) | ORAL | 0 refills | Status: AC

## 2023-07-03 MED ORDER — IBUPROFEN 400 MG PO TABS
600.0000 mg | ORAL_TABLET | Freq: Once | ORAL | Status: AC
  Administered 2023-07-03: 600 mg via ORAL
  Filled 2023-07-03: qty 2

## 2023-07-03 NOTE — ED Notes (Signed)
Pt reports taking 2 regular strength tylenol around 0700 today.

## 2023-07-03 NOTE — ED Provider Notes (Signed)
[facility] EMERGENCY DEPARTMENT AT [facility] Provider Note   CSN: [id number] Arrival date & time: 07/03/23  1058     History  Chief Complaint  Patient presents with   Dental Pain    [facility] is a 32 y.o. female.  To the ER with right lower dental pain.  Was seen in the ER for ear infection 5 days ago and was treated with antibiotics for Augmentin, that he was having jaw pain, no specific tooth pain, states her ear has gotten better but is having increased pain of the tooth especially with any touching or chewing.  She has not has appointment next week but does not feel that she can take the pain until then.  She did drop about 4 of the antibiotics down the drain so has not been continue to take them.  No fever or chills, no facial swelling, no trouble swallowing or breathing.   Dental Pain      Home Medications Prior to Admission medications   Medication Sig Start Date End Date Taking? Authorizing Provider  HYDROcodone-acetaminophen (NORCO) 5-325 MG tablet Take 1 tablet by mouth every 6 (six) hours as needed for moderate pain. 07/03/23  Yes Nuala Chiles A, PA-C  ibuprofen (ADVIL) 600 MG tablet Take 1 tablet (600 mg total) by mouth every 6 (six) hours as needed. 07/03/23  Yes Michaiah Holsopple A, PA-C  amoxicillin-clavulanate (AUGMENTIN) 875-125 MG tablet Take 1 tablet by mouth every 12 (twelve) hours. 07/03/23   Carmel Sacramento A, PA-C  traMADol (ULTRAM) 50 MG tablet Take 1 tablet (50 mg total) by mouth every 6 (six) hours as needed. Patient not taking: Reported on 09/09/2020 07/20/20   Pauline Aus, PA-C      Allergies    Codeine    Review of Systems   Review of Systems  Physical Exam Updated Vital Signs BP 131/88 (BP Location: Right Arm)   Pulse 88   Temp 98.1 F (36.7 C) (Oral)   Resp 20   Ht 5\' 2"  (1.575 m)   Wt 108.9 kg   LMP 06/13/2023   SpO2 98%   BMI 43.90 kg/m  Physical Exam Vitals and nursing note reviewed.  Constitutional:       General: She is not in acute distress.    Appearance: She is well-developed.  HENT:     Head: Normocephalic and atraumatic.     Right Ear: Tympanic membrane normal.     Left Ear: Tympanic membrane normal.     Mouth/Throat:     Mouth: Mucous membranes are moist.  Eyes:     Conjunctiva/sclera: Conjunctivae normal.  Cardiovascular:     Rate and Rhythm: Normal rate and regular rhythm.     Heart sounds: No murmur heard. Pulmonary:     Effort: Pulmonary effort is normal. No respiratory distress.     Breath sounds: Normal breath sounds.  Abdominal:     Palpations: Abdomen is soft.     Tenderness: There is no abdominal tenderness.  Musculoskeletal:        General: No swelling.     Cervical back: Neck supple.  Skin:    General: Skin is warm and dry.     Capillary Refill: Capillary refill takes less than 2 seconds.  Neurological:     Mental Status: She is alert.  Psychiatric:        Mood and Affect: Mood normal.     ED Results / Procedures / Treatments   Labs (all labs ordered are listed,  but only abnormal results are displayed) Labs Reviewed - No data to display  EKG None  Radiology No results found.  Procedures Dental Block  Date/Time: 07/03/2023 1:46 PM  Performed by: Ma Rings, PA-C Authorized by: Ma Rings, PA-C   Consent:    Consent obtained:  Verbal   Consent given by:  Patient   Risks discussed:  Infection, nerve damage, intravascular injection, pain and unsuccessful block   Alternatives discussed:  Alternative treatment Universal protocol:    Procedure explained and questions answered to patient or proxy's satisfaction: yes     Patient identity confirmed:  Verbally with patient Indications:    Indications: dental pain   Location:    Block type:  Inferior alveolar   Laterality:  Right Procedure details:    Syringe type:  Luer lock syringe   Needle gauge:  25 G   Anesthetic injected:  Bupivacaine 0.5% WITH epi   Injection procedure:   Anatomic landmarks identified, introduced needle, incremental injection, negative aspiration for blood and anatomic landmarks palpated Post-procedure details:    Outcome:  Pain improved   Procedure completion:  Tolerated well, no immediate complications     Medications Ordered in ED Medications  bupivacaine-epinephrine (PF) (MARCAINE W/ EPI) 0.5% -1:200000 injection 1.8 mL (has no administration in time range)  ibuprofen (ADVIL) tablet 600 mg (has no administration in time range)  HYDROcodone-acetaminophen (NORCO/VICODIN) 5-325 MG per tablet 1 tablet (has no administration in time range)    ED Course/ Medical Decision Making/ A&P                                 Medical Decision Making Ddx: dental abscess, dental caries, dental fracture, alveolar osteitis, ludwigs angina, other ED course: Patient present with pain in the lower molar. They speak in full and clear sentences,  handle their oral secretions well. There is no sign of deep space abscess. There is no drainable collection.  Pain management: Pt given a block for pain control Antibiotics provided for dental infection, patient was provided with outpatient dental resources and advised to come back to the ED for any new or worsening symptoms.   Will give additional 5 days of Augmentin as she had partial course.  Dental follow-up already established.  Risk Prescription drug management.           Final Clinical Impression(s) / ED Diagnoses Final diagnoses:  Pain, dental    Rx / DC Orders ED Discharge Orders          Ordered    amoxicillin-clavulanate (AUGMENTIN) 875-125 MG tablet  Every 12 hours        07/03/23 1340    HYDROcodone-acetaminophen (NORCO) 5-325 MG tablet  Every 6 hours PRN        07/03/23 1342    ibuprofen (ADVIL) 600 MG tablet  Every 6 hours PRN        07/03/23 1344              Carmel Sacramento A, PA-C 07/03/23 1347    Derwood Kaplan, MD 07/04/23 1121

## 2023-07-03 NOTE — ED Triage Notes (Signed)
Pt c/o right lower dental pain that started last wednesday

## 2023-07-03 NOTE — Discharge Instructions (Addendum)
Seen today for dental pain.  We are going to give his pain medicine and extend her antibiotics since you dropped several of the Augmentin down the sink.  Follow-up with your dentist.  If you have facial swelling, fever, trouble swallowing or breathing or any other new or worsening symptoms please come back to ER right away.

## 2023-09-06 ENCOUNTER — Telehealth: Payer: Self-pay

## 2023-09-08 NOTE — Telephone Encounter (Signed)
  Medicaid Managed Care   Unsuccessful Outreach Note  09/08/2023 Name: Rose Holden MRN: [id number] DOB: 1991/01/01  Referred by: [facility] Reason for referral : No chief complaint on file.   An unsuccessful telephone outreach was attempted today. The patient was referred to the case management team for assistance with care management and care coordination.   Follow Up Plan: If patient returns call to provider office, please advise to call Embedded Care Management Care Guide Nicholes Rough* at [phone]*  Nicholes Rough, CMA Care Guide [employer]
